# Patient Record
Sex: Male | Born: 1965
Health system: Southern US, Community
[De-identification: ages and names within clinical notes are randomized; demographics above are authoritative.]

## PROBLEM LIST (undated history)

## (undated) DIAGNOSIS — F329 Major depressive disorder, single episode, unspecified: Secondary | ICD-10-CM

## (undated) DIAGNOSIS — E785 Hyperlipidemia, unspecified: Secondary | ICD-10-CM

## (undated) DIAGNOSIS — I10 Essential (primary) hypertension: Secondary | ICD-10-CM

## (undated) DIAGNOSIS — F32A Depression, unspecified: Secondary | ICD-10-CM

## (undated) DIAGNOSIS — B009 Herpesviral infection, unspecified: Secondary | ICD-10-CM

---

## 2012-09-28 ENCOUNTER — Emergency Department (HOSPITAL_BASED_OUTPATIENT_CLINIC_OR_DEPARTMENT_OTHER): Payer: Self-pay

## 2012-09-28 ENCOUNTER — Encounter (HOSPITAL_BASED_OUTPATIENT_CLINIC_OR_DEPARTMENT_OTHER): Payer: Self-pay

## 2012-09-28 ENCOUNTER — Emergency Department (HOSPITAL_BASED_OUTPATIENT_CLINIC_OR_DEPARTMENT_OTHER)
Admission: EM | Admit: 2012-09-28 | Discharge: 2012-09-28 | Disposition: A | Discharge status: Home / Self Care | Payer: Self-pay | Attending: Emergency Medicine | Admitting: Emergency Medicine

## 2012-09-28 DIAGNOSIS — R197 Diarrhea, unspecified: Secondary | ICD-10-CM | POA: Insufficient documentation

## 2012-09-28 DIAGNOSIS — R1110 Vomiting, unspecified: Secondary | ICD-10-CM

## 2012-09-28 DIAGNOSIS — Z880 Allergy status to penicillin: Secondary | ICD-10-CM | POA: Insufficient documentation

## 2012-09-28 DIAGNOSIS — R112 Nausea with vomiting, unspecified: Secondary | ICD-10-CM | POA: Insufficient documentation

## 2012-09-28 LAB — CBC WITH DIFFERENTIAL/PLATELET
Eosinophils Relative: 1 % (ref 0–5)
HCT: 50.8 % (ref 39.0–52.0)
Hemoglobin: 16.8 g/dL (ref 13.0–17.0)
Lymphocytes Relative: 41 % (ref 12–46)
Lymphs Abs: 2.3 10*3/uL (ref 0.7–4.0)
MCV: 85.8 fL (ref 78.0–100.0)
Monocytes Absolute: 0.4 10*3/uL (ref 0.1–1.0)
Monocytes Relative: 7 % (ref 3–12)
Neutro Abs: 3 10*3/uL (ref 1.7–7.7)
WBC: 5.8 10*3/uL (ref 4.0–10.5)

## 2012-09-28 LAB — COMPREHENSIVE METABOLIC PANEL
AST: 28 U/L (ref 0–37)
BUN: 10 mg/dL (ref 6–23)
CO2: 31 mEq/L (ref 19–32)
Chloride: 104 mEq/L (ref 96–112)
Creatinine, Ser: 1 mg/dL (ref 0.50–1.35)
GFR calc Af Amer: >90 mL/min (ref >90)
GFR calc non Af Amer: 87 mL/min — ABNORMAL LOW (ref >90)
Glucose, Bld: 100 mg/dL — ABNORMAL HIGH (ref 70–99)
Total Bilirubin: 0.8 mg/dL (ref 0.3–1.2)

## 2012-09-28 MED ORDER — SODIUM CHLORIDE 0.9 % IV SOLN
Freq: Once | INTRAVENOUS | Status: AC
  Administered 2012-09-28: 17:00:00 via INTRAVENOUS

## 2012-09-28 MED ORDER — ONDANSETRON 4 MG PO TBDP: 4.0000 mg | ORAL_TABLET | Freq: Three times a day (TID) | ORAL | Status: DC | PRN

## 2012-09-28 MED ORDER — ONDANSETRON HCL 4 MG/2ML IJ SOLN
4.0000 mg | Freq: Once | INTRAMUSCULAR | Status: AC
  Administered 2012-09-28: 4 mg via INTRAVENOUS
  Filled 2012-09-28: qty 2

## 2012-09-28 NOTE — ED Provider Notes (Signed)
Medical screening examination/treatment/procedure(s) were performed by non-physician practitioner and as supervising physician I was immediately available for consultation/collaboration.   Dagmar Hait, MD 09/28/12 [phone]

## 2012-09-28 NOTE — ED Provider Notes (Signed)
CSN: [id number]     Arrival date & time 09/28/12  1410 History   First MD Initiated Contact with Patient 09/28/12 1506     Chief Complaint  Patient presents with  . Emesis   (Consider location/radiation/quality/duration/timing/severity/associated sxs/prior Treatment) Patient is a 47 y.o. male presenting with vomiting.  Emesis Severity:  Moderate Duration:  4 days Timing:  Constant Number of daily episodes:  4 Progression:  Worsening Chronicity:  New Recent urination:  Normal Relieved by:  Nothing Worsened by:  Nothing tried Ineffective treatments:  None tried Risk factors: no diabetes   Pt complains of vomiting and diarrhea.   Pt gave blood at the plasma center.    History reviewed. No pertinent past medical history. History reviewed. No pertinent past surgical history. No family history on file. History  Substance Use Topics  . Smoking status: Never Smoker   . Smokeless tobacco: Not on file  . Alcohol Use: No    Review of Systems  Gastrointestinal: Positive for vomiting.  All other systems reviewed and are negative.    Allergies  Penicillins  Home Medications  No current outpatient prescriptions on file. BP 119/78  Pulse 76  Temp(Src) 98.7 F (37.1 C)  Resp 20  Wt 247 lb (112.038 kg)  SpO2 98% Physical Exam  Nursing note and vitals reviewed. Constitutional: He appears well-developed and well-nourished.  HENT:  Head: Normocephalic.  Right Ear: External ear normal.  Left Ear: External ear normal.  Nose: Nose normal.  Mouth/Throat: Oropharynx is clear and moist.  Eyes: Conjunctivae and EOM are normal. Pupils are equal, round, and reactive to light.  Neck: Normal range of motion. Neck supple.  Cardiovascular: Normal rate.   Pulmonary/Chest: Effort normal.  Abdominal: Soft. There is tenderness.  Musculoskeletal: Normal range of motion.  Neurological: He is alert.  Skin: Skin is warm.    ED Course  Procedures (including critical care time) Labs  Review Labs Reviewed  CBC WITH DIFFERENTIAL - Abnormal; Notable for the following:    RBC 5.92 (*)    All other components within normal limits  COMPREHENSIVE METABOLIC PANEL   Imaging Review No results found.  MDM   1. Vomiting and diarrhea   Pt reports feeling better, he is able to tolerate po fluids Pt given Iv fluids,  Labs reviewed.   Pt given rx for zofran.   I advised follow up for recheck in 48 hours if not improved    Elson Areas, [place] 09/28/12 2218

## 2012-09-28 NOTE — ED Notes (Signed)
Patient here with reported vomiting x 4 since donating plasma this am. Reports ongoing nausea and feels weak. Denies pain

## 2012-11-26 ENCOUNTER — Emergency Department (HOSPITAL_BASED_OUTPATIENT_CLINIC_OR_DEPARTMENT_OTHER): Payer: Self-pay

## 2012-11-26 ENCOUNTER — Encounter (HOSPITAL_BASED_OUTPATIENT_CLINIC_OR_DEPARTMENT_OTHER): Payer: Self-pay | Admitting: Emergency Medicine

## 2012-11-26 ENCOUNTER — Emergency Department (HOSPITAL_BASED_OUTPATIENT_CLINIC_OR_DEPARTMENT_OTHER)
Admission: EM | Admit: 2012-11-26 | Discharge: 2012-11-27 | Disposition: A | Discharge status: Home / Self Care | Payer: Self-pay | Attending: Emergency Medicine | Admitting: Emergency Medicine

## 2012-11-26 DIAGNOSIS — J029 Acute pharyngitis, unspecified: Secondary | ICD-10-CM | POA: Insufficient documentation

## 2012-11-26 DIAGNOSIS — Z880 Allergy status to penicillin: Secondary | ICD-10-CM | POA: Insufficient documentation

## 2012-11-26 DIAGNOSIS — J159 Unspecified bacterial pneumonia: Secondary | ICD-10-CM | POA: Insufficient documentation

## 2012-11-26 DIAGNOSIS — J189 Pneumonia, unspecified organism: Secondary | ICD-10-CM

## 2012-11-26 NOTE — ED Notes (Signed)
Pt c/o cough and sore throat x2 days. Pt sts cough is productive of thick, brown sputum. Pt sts he has had pneumonia before and this feels similar.

## 2012-11-27 MED ORDER — AZITHROMYCIN 250 MG PO TABS: ORAL_TABLET | ORAL | Status: DC

## 2012-11-27 MED ORDER — HYDROCOD POLST-CHLORPHEN POLST 10-8 MG/5ML PO LQCR: 5.0000 mL | Freq: Two times a day (BID) | ORAL | Status: DC | PRN

## 2012-11-27 NOTE — ED Provider Notes (Signed)
CSN: [id number]     Arrival date & time 11/26/12  2320 History   First MD Initiated Contact with Patient 11/27/12 0150     Chief Complaint  Patient presents with  . Cough   (Consider location/radiation/quality/duration/timing/severity/associated sxs/prior Treatment) HPI This is a 47 year old male with a two-day history of cough accompanied by sore throat, rhinorrhea, nasal congestion and general malaise. His cough is productive of brown sputum. He characterizes the symptoms is like prior pneumonia. He denies nausea or vomiting. He is not aware of having a fever. He states his ribs hurt with a burning type pain, worse with coughing or sneezing.  History reviewed. No pertinent past medical history. History reviewed. No pertinent past surgical history. No family history on file. History  Substance Use Topics  . Smoking status: Never Smoker   . Smokeless tobacco: Not on file  . Alcohol Use: No    Review of Systems  All other systems reviewed and are negative.    Allergies  Penicillins  Home Medications   Current Outpatient Rx  Name  Route  Sig  Dispense  Refill  . hydrOXYzine (ATARAX/VISTARIL) 25 MG tablet   Oral   Take 25 mg by mouth 3 (three) times daily as needed.         . ondansetron (ZOFRAN ODT) 4 MG disintegrating tablet   Oral   Take 1 tablet (4 mg total) by mouth every 8 (eight) hours as needed for nausea.   20 tablet   0    BP 122/80  Pulse 99  Temp(Src) 98.9 F (37.2 C) (Oral)  Resp 18  Ht 6\' 1"  (1.854 m)  Wt 276 lb (125.193 kg)  BMI 36.42 kg/m2  SpO2 94%  Physical Exam General: Well-developed, well-nourished male in no acute distress; appearance consistent with age of record HENT: normocephalic; atraumatic; mild pharyngeal erythema; nasal congestion Eyes: pupils equal, round and reactive to light; extraocular muscles intact; arcus senilis bilaterally Neck: supple Heart: regular rate and rhythm Lungs: clear to auscultation bilaterally Abdomen:  soft; nondistended Extremities: No deformity; full range of motion Neurologic: Awake, alert and oriented; motor function intact in all extremities and symmetric; no facial droop Skin: Warm and dry Psychiatric: Flat affect    ED Course  Procedures (including critical care time)  MDM  Nursing notes and vitals signs, including pulse oximetry, reviewed.  Summary of this visit's results, reviewed by myself:  Imaging Studies: Dg Chest 2 View  11/27/2012   CLINICAL DATA:  Cough and congestion for 2 days.  EXAM: CHEST  2 VIEW  COMPARISON:  Chest radiograph performed 04/25/2012  FINDINGS: The lungs are well-aerated. Minimal right basilar opacity may reflect atelectasis or less likely mild pneumonia. There is no evidence of pleural effusion or pneumothorax.  The heart is normal in size; the mediastinal contour is within normal limits. No acute osseous abnormalities are seen.  IMPRESSION: Minimal right basilar airspace opacity may reflect atelectasis or less likely mild pneumonia.   Electronically Signed   By: Roanna Raider M.D.   On: 11/27/2012 00:29        Hanley Seamen, MD 11/27/12 [phone]

## 2014-04-16 ENCOUNTER — Encounter (HOSPITAL_BASED_OUTPATIENT_CLINIC_OR_DEPARTMENT_OTHER): Payer: Self-pay

## 2014-04-16 ENCOUNTER — Emergency Department (HOSPITAL_BASED_OUTPATIENT_CLINIC_OR_DEPARTMENT_OTHER)
Admission: EM | Admit: 2014-04-16 | Discharge: 2014-04-16 | Disposition: A | Discharge status: Home / Self Care | Payer: BLUE CROSS/BLUE SHIELD | Attending: Emergency Medicine | Admitting: Emergency Medicine

## 2014-04-16 ENCOUNTER — Emergency Department (HOSPITAL_BASED_OUTPATIENT_CLINIC_OR_DEPARTMENT_OTHER): Payer: BLUE CROSS/BLUE SHIELD

## 2014-04-16 DIAGNOSIS — Z79899 Other long term (current) drug therapy: Secondary | ICD-10-CM | POA: Diagnosis not present

## 2014-04-16 DIAGNOSIS — R05 Cough: Secondary | ICD-10-CM | POA: Diagnosis present

## 2014-04-16 DIAGNOSIS — Z880 Allergy status to penicillin: Secondary | ICD-10-CM | POA: Insufficient documentation

## 2014-04-16 DIAGNOSIS — J209 Acute bronchitis, unspecified: Secondary | ICD-10-CM | POA: Insufficient documentation

## 2014-04-16 MED ORDER — AZITHROMYCIN 250 MG PO TABS: ORAL_TABLET | ORAL | Status: DC

## 2014-04-16 MED ORDER — HYDROCOD POLST-CHLORPHEN POLST 10-8 MG/5ML PO LQCR: 5.0000 mL | Freq: Two times a day (BID) | ORAL | Status: DC | PRN

## 2014-04-16 NOTE — ED Notes (Signed)
Reports cough, cold, chills, vomiting and body aches since Saturday. No relief with OTC meds

## 2014-04-16 NOTE — ED Notes (Signed)
Patient transported to X-ray 

## 2014-04-16 NOTE — ED Notes (Signed)
Pt reports cough x Friday night producing very small amounts of sputum, "not enough to make me feel better".

## 2014-04-16 NOTE — Discharge Instructions (Signed)
Zithromax as prescribed.  Tussionex as prescribed as needed for cough.  Return to the emergency department if your symptoms significantly worsen or change.    Acute Bronchitis Bronchitis is inflammation of the airways that extend from the windpipe into the lungs (bronchi). The inflammation often causes mucus to develop. This leads to a cough, which is the most common symptom of bronchitis.  In acute bronchitis, the condition usually develops suddenly and goes away over time, usually in a couple weeks. Smoking, allergies, and asthma can make bronchitis worse. Repeated episodes of bronchitis may cause further lung problems.  CAUSES Acute bronchitis is most often caused by the same virus that causes a cold. The virus can spread from person to person (contagious) through coughing, sneezing, and touching contaminated objects. SIGNS AND SYMPTOMS   Cough.   Fever.   Coughing up mucus.   Body aches.   Chest congestion.   Chills.   Shortness of breath.   Sore throat.  DIAGNOSIS  Acute bronchitis is usually diagnosed through a physical exam. Your health care provider will also ask you questions about your medical history. Tests, such as chest X-rays, are sometimes done to rule out other conditions.  TREATMENT  Acute bronchitis usually goes away in a couple weeks. Oftentimes, no medical treatment is necessary. Medicines are sometimes given for relief of fever or cough. Antibiotic medicines are usually not needed but may be prescribed in certain situations. In some cases, an inhaler may be recommended to help reduce shortness of breath and control the cough. A cool mist vaporizer may also be used to help thin bronchial secretions and make it easier to clear the chest.  HOME CARE INSTRUCTIONS  Get plenty of rest.   Drink enough fluids to keep your urine clear or pale yellow (unless you have a medical condition that requires fluid restriction). Increasing fluids may help thin your  respiratory secretions (sputum) and reduce chest congestion, and it will prevent dehydration.   Take medicines only as directed by your health care provider.  If you were prescribed an antibiotic medicine, finish it all even if you start to feel better.  Avoid smoking and secondhand smoke. Exposure to cigarette smoke or irritating chemicals will make bronchitis worse. If you are a smoker, consider using nicotine gum or skin patches to help control withdrawal symptoms. Quitting smoking will help your lungs heal faster.   Reduce the chances of another bout of acute bronchitis by washing your hands frequently, avoiding people with cold symptoms, and trying not to touch your hands to your mouth, nose, or eyes.   Keep all follow-up visits as directed by your health care provider.  SEEK MEDICAL CARE IF: Your symptoms do not improve after 1 week of treatment.  SEEK IMMEDIATE MEDICAL CARE IF:  You develop an increased fever or chills.   You have chest pain.   You have severe shortness of breath.  You have bloody sputum.   You develop dehydration.  You faint or repeatedly feel like you are going to pass out.  You develop repeated vomiting.  You develop a severe headache. MAKE SURE YOU:   Understand these instructions.  Will watch your condition.  Will get help right away if you are not doing well or get worse. Document Released: 02/10/2004 Document Revised: 05/19/2013 Document Reviewed: 06/25/2012 [facility] Patient Information 2015 [place], [place]. This information is not intended to replace advice given to you by your health care provider. Make sure you discuss any questions you have with your  health care provider. ° °

## 2014-04-16 NOTE — ED Provider Notes (Signed)
CSN: [id number]     Arrival date & time 04/16/14  1628 History   First MD Initiated Contact with Patient 04/16/14 1642     Chief Complaint  Patient presents with  . Influenza     (Consider location/radiation/quality/duration/timing/severity/associated sxs/prior Treatment) HPI Comments: Patient is a 49 year old male who presents with complaints of chest congestion and cough for one week. He denies any fevers. He denies a shortness of breath or chest pain. He does feel chilled and reports body aches. He is having little relief with over-the-counter medications.  Patient is a 49 y.o. male presenting with flu symptoms. The history is provided by the patient.  Influenza Presenting symptoms: cough   Presenting symptoms: no nausea, no sore throat and no vomiting   Severity:  Moderate Onset quality:  Sudden Duration:  1 week Progression:  Worsening Chronicity:  New Relieved by:  Nothing Worsened by:  Nothing tried Ineffective treatments:  None tried Associated symptoms: no chills and no congestion   Risk factors: no diabetes problem     History reviewed. No pertinent past medical history. History reviewed. No pertinent past surgical history. No family history on file. History  Substance Use Topics  . Smoking status: Never Smoker   . Smokeless tobacco: Not on file  . Alcohol Use: No    Review of Systems  Constitutional: Negative for chills.  HENT: Negative for congestion and sore throat.   Respiratory: Positive for cough.   Gastrointestinal: Negative for nausea and vomiting.  All other systems reviewed and are negative.     Allergies  Penicillins  Home Medications   Prior to Admission medications   Medication Sig Start Date End Date Taking? Authorizing Provider  azithromycin (ZITHROMAX Z-PAK) 250 MG tablet 2 po day one, then 1 daily x 4 days 11/27/12   Paula Libra, MD  chlorpheniramine-HYDROcodone [facility]) 10-8 MG/5ML LQCR Take 5 mLs by mouth every 12  (twelve) hours as needed for cough. 11/27/12   John Molpus, MD  hydrOXYzine (ATARAX/VISTARIL) 25 MG tablet Take 25 mg by mouth 3 (three) times daily as needed.    Historical Provider, MD  ondansetron (ZOFRAN ODT) 4 MG disintegrating tablet Take 1 tablet (4 mg total) by mouth every 8 (eight) hours as needed for nausea. 09/28/12   Elson Areas, PA-C   BP 129/78 mmHg  Pulse 94  Temp(Src) 98.9 F (37.2 C) (Oral)  Resp 16  Ht  (1.854 m)  Wt 277 lb (125.646 kg)  BMI 36.55 kg/m2  SpO2 97% Physical Exam  Constitutional: He is oriented to person, place, and time. He appears well-developed and well-nourished. No distress.  HENT:  Head: Normocephalic and atraumatic.  Mouth/Throat: Oropharynx is clear and moist. No oropharyngeal exudate.  Neck: Normal range of motion. Neck supple.  Cardiovascular: Normal rate, regular rhythm and normal heart sounds.   No murmur heard. Pulmonary/Chest: Effort normal and breath sounds normal. No respiratory distress. He has no wheezes. He has no rales. He exhibits no tenderness.  Abdominal: Soft. Bowel sounds are normal. He exhibits no distension. There is no tenderness.  Musculoskeletal: Normal range of motion. He exhibits no edema.  Lymphadenopathy:    He has no cervical adenopathy.  Neurological: He is alert and oriented to person, place, and time.  Skin: Skin is warm and dry. He is not diaphoretic.  Nursing note and vitals reviewed.   ED Course  Procedures (including critical care time) Labs Review Labs Reviewed - No data to display  Imaging Review No  results found.   EKG Interpretation None      MDM   Final diagnoses:  None    X-ray reveals no evidence for acute cardiopulmonary abnormality. He tells me he has a history of pneumonia and believes he feels the same. He will be given a prescription for Zithromax and cough medication and advised to return if his symptoms worsen or change.    Geoffery Lyonsouglas Wenceslao Loper, MD 04/16/14 [phone]

## 2014-06-10 ENCOUNTER — Encounter (HOSPITAL_BASED_OUTPATIENT_CLINIC_OR_DEPARTMENT_OTHER): Payer: Self-pay | Admitting: Emergency Medicine

## 2014-06-10 ENCOUNTER — Emergency Department (HOSPITAL_BASED_OUTPATIENT_CLINIC_OR_DEPARTMENT_OTHER): Payer: BLUE CROSS/BLUE SHIELD

## 2014-06-10 ENCOUNTER — Emergency Department (HOSPITAL_BASED_OUTPATIENT_CLINIC_OR_DEPARTMENT_OTHER)
Admission: EM | Admit: 2014-06-10 | Discharge: 2014-06-10 | Disposition: A | Discharge status: Home / Self Care | Payer: BLUE CROSS/BLUE SHIELD | Attending: Emergency Medicine | Admitting: Emergency Medicine

## 2014-06-10 DIAGNOSIS — J209 Acute bronchitis, unspecified: Secondary | ICD-10-CM | POA: Diagnosis not present

## 2014-06-10 DIAGNOSIS — F329 Major depressive disorder, single episode, unspecified: Secondary | ICD-10-CM | POA: Insufficient documentation

## 2014-06-10 DIAGNOSIS — Z880 Allergy status to penicillin: Secondary | ICD-10-CM | POA: Insufficient documentation

## 2014-06-10 DIAGNOSIS — Z79899 Other long term (current) drug therapy: Secondary | ICD-10-CM | POA: Insufficient documentation

## 2014-06-10 DIAGNOSIS — J40 Bronchitis, not specified as acute or chronic: Secondary | ICD-10-CM

## 2014-06-10 DIAGNOSIS — M791 Myalgia: Secondary | ICD-10-CM | POA: Insufficient documentation

## 2014-06-10 DIAGNOSIS — R05 Cough: Secondary | ICD-10-CM | POA: Diagnosis present

## 2014-06-10 DIAGNOSIS — Z792 Long term (current) use of antibiotics: Secondary | ICD-10-CM | POA: Insufficient documentation

## 2014-06-10 HISTORY — DX: Depression, unspecified: F32.A

## 2014-06-10 HISTORY — DX: Major depressive disorder, single episode, unspecified: F32.9

## 2014-06-10 LAB — RAPID STREP SCREEN (MED CTR MEBANE ONLY): STREPTOCOCCUS, GROUP A SCREEN (DIRECT): NEGATIVE

## 2014-06-10 MED ORDER — BENZONATATE 100 MG PO CAPS
200.0000 mg | ORAL_CAPSULE | Freq: Once | ORAL | Status: AC
  Administered 2014-06-10: 200 mg via ORAL
  Filled 2014-06-10: qty 2

## 2014-06-10 MED ORDER — GUAIFENESIN-CODEINE 100-10 MG/5ML PO SYRP: 5.0000 mL | ORAL_SOLUTION | Freq: Three times a day (TID) | ORAL | Status: DC | PRN

## 2014-06-10 MED ORDER — NAPROXEN 250 MG PO TABS
500.0000 mg | ORAL_TABLET | Freq: Once | ORAL | Status: AC
  Administered 2014-06-10: 500 mg via ORAL
  Filled 2014-06-10: qty 2

## 2014-06-10 MED ORDER — GI COCKTAIL ~~LOC~~
30.0000 mL | Freq: Once | ORAL | Status: AC
  Administered 2014-06-10: 30 mL via ORAL
  Filled 2014-06-10: qty 30

## 2014-06-10 MED ORDER — NAPROXEN 375 MG PO TABS: 375.0000 mg | ORAL_TABLET | Freq: Two times a day (BID) | ORAL | Status: DC

## 2014-06-10 MED ORDER — DOXYCYCLINE HYCLATE 100 MG PO CAPS: 100.0000 mg | ORAL_CAPSULE | Freq: Two times a day (BID) | ORAL | Status: DC

## 2014-06-10 NOTE — ED Notes (Signed)
C/o cough, productive,   Congestion and sorethroat since Saturday pm

## 2014-06-10 NOTE — ED Provider Notes (Signed)
CSN: [id number]     Arrival date & time 06/10/14  0030 History   First MD Initiated Contact with Patient 06/10/14 0111     Chief Complaint  Patient presents with  . Cough     (Consider location/radiation/quality/duration/timing/severity/associated sxs/prior Treatment) Patient is a 49 y.o. male presenting with cough. The history is provided by the patient.  Cough Cough characteristics:  Productive Sputum characteristics:  Manson PasseyBrown Severity:  Moderate Onset quality:  Gradual Duration:  5 days Timing:  Intermittent Progression:  Unchanged Chronicity:  Recurrent Context: not animal exposure   Relieved by:  Nothing Worsened by:  Nothing tried Ineffective treatments:  Rest Associated symptoms: myalgias, rhinorrhea, sinus congestion and sore throat   Associated symptoms: no chest pain, no fever, no rash, no shortness of breath and no wheezing   Risk factors: no recent travel   patient states he has had bronchitis in the past and this feels the same.    Past Medical History  Diagnosis Date  . Depression    History reviewed. No pertinent past surgical history. History reviewed. No pertinent family history. History  Substance Use Topics  . Smoking status: Never Smoker   . Smokeless tobacco: Not on file  . Alcohol Use: No    Review of Systems  Constitutional: Negative for fever.  HENT: Positive for rhinorrhea and sore throat.   Eyes: Negative for photophobia.  Respiratory: Positive for cough. Negative for shortness of breath and wheezing.   Cardiovascular: Negative for chest pain.  Gastrointestinal: Negative for vomiting and diarrhea.  Genitourinary: Negative for dysuria.  Musculoskeletal: Positive for myalgias. Negative for neck pain and neck stiffness.  Skin: Negative for rash.  All other systems reviewed and are negative.     Allergies  Penicillins  Home Medications   Prior to Admission medications   Medication Sig Start Date End Date Taking? Authorizing Provider   sertraline (ZOLOFT) 50 MG tablet Take 50 mg by mouth daily.   Yes Historical Provider, MD  azithromycin (ZITHROMAX Z-PAK) 250 MG tablet 2 po day one, then 1 daily x 4 days 04/16/14   Geoffery Lyonsouglas Delo, MD  chlorpheniramine-HYDROcodone [facility](TUSSIONEX PENNKINETIC ER) 10-8 MG/5ML LQCR Take 5 mLs by mouth every 12 (twelve) hours as needed for cough. 04/16/14   Geoffery Lyonsouglas Delo, MD  doxycycline (VIBRAMYCIN) 100 MG capsule Take 1 capsule (100 mg total) by mouth 2 (two) times daily. One po bid x 7 days 06/10/14   Maricel Swartzendruber, MD  guaiFENesin-codeine [facility](GUAIATUSSIN AC) 100-10 MG/5ML syrup Take 5 mLs by mouth 3 (three) times daily as needed for cough. 06/10/14   Sejla Marzano, MD  hydrOXYzine (ATARAX/VISTARIL) 25 MG tablet Take 25 mg by mouth 3 (three) times daily as needed.    Historical Provider, MD  naproxen (NAPROSYN) 375 MG tablet Take 1 tablet (375 mg total) by mouth 2 (two) times daily. 06/10/14   Samah Lapiana, MD  ondansetron (ZOFRAN ODT) 4 MG disintegrating tablet Take 1 tablet (4 mg total) by mouth every 8 (eight) hours as needed for nausea. 09/28/12   Elson AreasLeslie K Sofia, PA-C   BP 152/83 mmHg  Pulse 114  Temp(Src) 99.7 F (37.6 C) (Oral)  Resp 22  Ht 6\' 1"  (1.854 m)  Wt 237 lb (107.502 kg)  BMI 31.27 kg/m2  SpO2 95% Physical Exam  Constitutional: He is oriented to person, place, and time. He appears well-developed and well-nourished. No distress.  HENT:  Head: Normocephalic and atraumatic.  Right Ear: External ear normal.  Left Ear: External ear normal.  Mouth/Throat:  No oropharyngeal exudate.  Eyes: Conjunctivae and EOM are normal. Pupils are equal, round, and reactive to light.  Neck: Normal range of motion. Neck supple.  Cardiovascular: Normal rate, regular rhythm and intact distal pulses.   Pulmonary/Chest: Effort normal and breath sounds normal. No stridor. No respiratory distress. He has no wheezes. He has no rales. He exhibits no tenderness.  Abdominal: Soft. Bowel sounds are normal. There is no  tenderness. There is no rebound and no guarding.  Musculoskeletal: Normal range of motion.  Lymphadenopathy:    He has no cervical adenopathy.  Neurological: He is alert and oriented to person, place, and time.  Skin: Skin is warm and dry.  Psychiatric: He has a normal mood and affect.    ED Course  Procedures (including critical care time) Labs Review Labs Reviewed  RAPID STREP SCREEN  CULTURE, GROUP A STREP    Imaging Review Dg Chest 2 View (if Patient Has Fever And/or Copd)  06/10/2014   CLINICAL DATA:  Cough and body aches beginning 4 days ago.  EXAM: CHEST  2 VIEW  COMPARISON:  04/16/2014  FINDINGS: Normal heart size and mediastinal contours. No acute infiltrate or edema. No effusion or pneumothorax. No acute osseous findings.  IMPRESSION: No active cardiopulmonary disease.   Electronically Signed   By: Marnee Spring M.D.   On: 06/10/2014 01:28     EKG Interpretation None      MDM   Final diagnoses:  Bronchitis    Will treat symptomatically.  Follow up with your family doctor in 2 days for recheck.  Return for intractable fevers, weakness, inability to tolerate liquids or any concerns    Tyreon Frigon, MD 06/10/14 [phone]

## 2014-06-10 NOTE — ED Notes (Signed)
Patient states that he is coughing and has pheglm with a brown tinge to it. The patient reports that he has gotten progressively sore "like all my bones are going to shatter"

## 2014-06-12 LAB — CULTURE, GROUP A STREP: Strep A Culture: NEGATIVE

## 2014-08-03 ENCOUNTER — Encounter (HOSPITAL_COMMUNITY): Payer: Self-pay | Admitting: *Deleted

## 2014-08-03 ENCOUNTER — Emergency Department (HOSPITAL_COMMUNITY)
Admission: EM | Admit: 2014-08-03 | Discharge: 2014-08-03 | Disposition: A | Discharge status: Home / Self Care | Payer: BLUE CROSS/BLUE SHIELD | Attending: Emergency Medicine | Admitting: Emergency Medicine

## 2014-08-03 ENCOUNTER — Emergency Department (HOSPITAL_COMMUNITY): Payer: BLUE CROSS/BLUE SHIELD

## 2014-08-03 DIAGNOSIS — F329 Major depressive disorder, single episode, unspecified: Secondary | ICD-10-CM | POA: Insufficient documentation

## 2014-08-03 DIAGNOSIS — Z79899 Other long term (current) drug therapy: Secondary | ICD-10-CM | POA: Insufficient documentation

## 2014-08-03 DIAGNOSIS — Z791 Long term (current) use of non-steroidal anti-inflammatories (NSAID): Secondary | ICD-10-CM | POA: Diagnosis not present

## 2014-08-03 DIAGNOSIS — S199XXA Unspecified injury of neck, initial encounter: Secondary | ICD-10-CM | POA: Insufficient documentation

## 2014-08-03 DIAGNOSIS — Z880 Allergy status to penicillin: Secondary | ICD-10-CM | POA: Diagnosis not present

## 2014-08-03 DIAGNOSIS — Y92410 Unspecified street and highway as the place of occurrence of the external cause: Secondary | ICD-10-CM | POA: Diagnosis not present

## 2014-08-03 DIAGNOSIS — Y999 Unspecified external cause status: Secondary | ICD-10-CM | POA: Insufficient documentation

## 2014-08-03 DIAGNOSIS — Y9389 Activity, other specified: Secondary | ICD-10-CM | POA: Diagnosis not present

## 2014-08-03 DIAGNOSIS — M542 Cervicalgia: Secondary | ICD-10-CM

## 2014-08-03 MED ORDER — TRAMADOL HCL 50 MG PO TABS: 50.0000 mg | ORAL_TABLET | Freq: Four times a day (QID) | ORAL | Status: DC | PRN

## 2014-08-03 MED ORDER — CYCLOBENZAPRINE HCL 10 MG PO TABS: 5.0000 mg | ORAL_TABLET | Freq: Two times a day (BID) | ORAL | Status: DC | PRN

## 2014-08-03 MED ORDER — NAPROXEN 500 MG PO TABS: 500.0000 mg | ORAL_TABLET | Freq: Two times a day (BID) | ORAL | Status: DC
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
## 2014-08-10 NOTE — ED Notes (Signed)
Pt's chart was accessed to reprint work note.

## 2015-01-09 ENCOUNTER — Emergency Department (HOSPITAL_BASED_OUTPATIENT_CLINIC_OR_DEPARTMENT_OTHER): Payer: BLUE CROSS/BLUE SHIELD

## 2015-01-09 ENCOUNTER — Emergency Department (HOSPITAL_BASED_OUTPATIENT_CLINIC_OR_DEPARTMENT_OTHER)
Admission: EM | Admit: 2015-01-09 | Discharge: 2015-01-09 | Disposition: A | Discharge status: Home / Self Care | Payer: BLUE CROSS/BLUE SHIELD | Attending: Emergency Medicine | Admitting: Emergency Medicine

## 2015-01-09 ENCOUNTER — Encounter (HOSPITAL_BASED_OUTPATIENT_CLINIC_OR_DEPARTMENT_OTHER): Payer: Self-pay

## 2015-01-09 DIAGNOSIS — Z880 Allergy status to penicillin: Secondary | ICD-10-CM | POA: Insufficient documentation

## 2015-01-09 DIAGNOSIS — K529 Noninfective gastroenteritis and colitis, unspecified: Secondary | ICD-10-CM | POA: Diagnosis not present

## 2015-01-09 DIAGNOSIS — F329 Major depressive disorder, single episode, unspecified: Secondary | ICD-10-CM | POA: Diagnosis not present

## 2015-01-09 DIAGNOSIS — Z79899 Other long term (current) drug therapy: Secondary | ICD-10-CM | POA: Diagnosis not present

## 2015-01-09 DIAGNOSIS — R109 Unspecified abdominal pain: Secondary | ICD-10-CM | POA: Diagnosis present

## 2015-01-09 DIAGNOSIS — I10 Essential (primary) hypertension: Secondary | ICD-10-CM | POA: Insufficient documentation

## 2015-01-09 HISTORY — DX: Essential (primary) hypertension: I10

## 2015-01-09 LAB — CBC WITH DIFFERENTIAL/PLATELET
BASOS PCT: 0 %
Basophils Absolute: 0 10*3/uL (ref 0.0–0.1)
Eosinophils Absolute: 0 10*3/uL (ref 0.0–0.7)
Eosinophils Relative: 0 %
HEMATOCRIT: 51.8 % (ref 39.0–52.0)
HEMOGLOBIN: 16.8 g/dL (ref 13.0–17.0)
LYMPHS ABS: 1.2 10*3/uL (ref 0.7–4.0)
Lymphocytes Relative: 12 %
MCH: 27.6 pg (ref 26.0–34.0)
MCHC: 32.4 g/dL (ref 30.0–36.0)
MCV: 85.1 fL (ref 78.0–100.0)
MONOS PCT: 4 %
Monocytes Absolute: 0.4 10*3/uL (ref 0.1–1.0)
NEUTROS ABS: 8.2 10*3/uL — AB (ref 1.7–7.7)
NEUTROS PCT: 84 %
Platelets: 259 10*3/uL (ref 150–400)
RBC: 6.09 MIL/uL — ABNORMAL HIGH (ref 4.22–5.81)
RDW: 14.3 % (ref 11.5–15.5)
WBC: 9.8 10*3/uL (ref 4.0–10.5)

## 2015-01-09 LAB — COMPREHENSIVE METABOLIC PANEL
ALBUMIN: 4.8 g/dL (ref 3.5–5.0)
ALK PHOS: 90 U/L (ref 38–126)
ALT: 46 U/L (ref 17–63)
ANION GAP: 9 (ref 5–15)
AST: 46 U/L — ABNORMAL HIGH (ref 15–41)
BILIRUBIN TOTAL: 1.5 mg/dL — AB (ref 0.3–1.2)
BUN: 16 mg/dL (ref 6–20)
CALCIUM: 9.5 mg/dL (ref 8.9–10.3)
CO2: 24 mmol/L (ref 22–32)
CREATININE: 0.96 mg/dL (ref 0.61–1.24)
Chloride: 107 mmol/L (ref 101–111)
GFR calc Af Amer: >60 mL/min (ref >60)
GFR calc non Af Amer: >60 mL/min (ref >60)
GLUCOSE: 131 mg/dL — AB (ref 65–99)
Potassium: 3.5 mmol/L (ref 3.5–5.1)
Sodium: 140 mmol/L (ref 135–145)
TOTAL PROTEIN: 8.4 g/dL — AB (ref 6.5–8.1)

## 2015-01-09 LAB — LIPASE, BLOOD: Lipase: 27 U/L (ref 11–51)

## 2015-01-09 MED ORDER — HYDROCODONE-ACETAMINOPHEN 5-325 MG PO TABS: 1.0000 | ORAL_TABLET | Freq: Four times a day (QID) | ORAL | Status: DC | PRN

## 2015-01-09 MED ORDER — ONDANSETRON HCL 4 MG/2ML IJ SOLN
4.0000 mg | Freq: Once | INTRAMUSCULAR | Status: AC
  Administered 2015-01-09: 4 mg via INTRAVENOUS
  Filled 2015-01-09: qty 2

## 2015-01-09 MED ORDER — FENTANYL CITRATE (PF) 100 MCG/2ML IJ SOLN
50.0000 ug | Freq: Once | INTRAMUSCULAR | Status: AC
  Administered 2015-01-09: 50 ug via INTRAVENOUS
  Filled 2015-01-09: qty 2

## 2015-01-09 MED ORDER — IOHEXOL 300 MG/ML  SOLN
100.0000 mL | Freq: Once | INTRAMUSCULAR | Status: AC | PRN
  Administered 2015-01-09: 100 mL via INTRAVENOUS

## 2015-01-09 MED ORDER — LOPERAMIDE HCL 2 MG PO TABS: 2.0000 mg | ORAL_TABLET | Freq: Four times a day (QID) | ORAL | Status: DC | PRN

## 2015-01-09 MED ORDER — SODIUM CHLORIDE 0.9 % IV BOLUS (SEPSIS)
1000.0000 mL | Freq: Once | INTRAVENOUS | Status: AC
  Administered 2015-01-09: 1000 mL via INTRAVENOUS

## 2015-01-09 MED ORDER — IOHEXOL 300 MG/ML  SOLN
25.0000 mL | Freq: Once | INTRAMUSCULAR | Status: AC | PRN
  Administered 2015-01-09: 25 mL via ORAL

## 2015-01-09 MED ORDER — SODIUM CHLORIDE 0.9 % IV SOLN: INTRAVENOUS | Status: DC

## 2015-01-09 MED ORDER — PROMETHAZINE HCL 25 MG PO TABS: 25.0000 mg | ORAL_TABLET | Freq: Four times a day (QID) | ORAL | Status: DC | PRN

## 2015-01-09 NOTE — ED Notes (Signed)
Patient presents to the ED with abdominal pain that began at 0300 today. Patient states the pain is gradually getting worse. Patient has vomited twice, three episodes of diarrhea and one episode of loss of consciousness. The patient hasn't taken any medications.

## 2015-01-09 NOTE — ED Provider Notes (Signed)
CSN: [id number]     Arrival date & time 01/09/15  0900 History   First MD Initiated Contact with Patient 01/09/15 0912     Chief Complaint  Patient presents with  . Abdominal Pain     (Consider location/radiation/quality/duration/timing/severity/associated sxs/prior Treatment) Patient is a 49 y.o. male presenting with abdominal pain. The history is provided by the patient.  Abdominal Pain Associated symptoms: diarrhea, nausea and vomiting   Associated symptoms: no chest pain, no fever and no shortness of breath    patient feeling well until 3 in the morning acute onset of nausea vomiting and diarrhea and crampy abdominal pain that was generalized. No blood in the bowel movements. Patient without any history of abdominal surgery. Patient states crampy abdominal pain is 8 out of 10.  Past Medical History  Diagnosis Date  . Depression   . Hypertension    History reviewed. No pertinent past surgical history. History reviewed. No pertinent family history. Social History  Substance Use Topics  . Smoking status: Never Smoker   . Smokeless tobacco: None  . Alcohol Use: No    Review of Systems  Constitutional: Negative for fever.  HENT: Negative for congestion.   Eyes: Negative for redness.  Respiratory: Negative for shortness of breath.   Cardiovascular: Negative for chest pain.  Gastrointestinal: Positive for nausea, vomiting, abdominal pain and diarrhea.  Musculoskeletal: Negative for myalgias.  Skin: Negative for rash.  Neurological: Negative for headaches.  Hematological: Does not bruise/bleed easily.  Psychiatric/Behavioral: Negative for confusion.      Allergies  Penicillins  Home Medications   Prior to Admission medications   Medication Sig Start Date End Date Taking? Authorizing Provider  azithromycin (ZITHROMAX Z-PAK) 250 MG tablet 2 po day one, then 1 daily x 4 days 04/16/14   Geoffery Lyons, MD  chlorpheniramine-HYDROcodone [facility]) 10-8 MG/5ML  LQCR Take 5 mLs by mouth every 12 (twelve) hours as needed for cough. 04/16/14   Geoffery Lyons, MD  cyclobenzaprine (FLEXERIL) 10 MG tablet Take 0.5-1 tablets (5-10 mg total) by mouth 2 (two) times daily as needed for muscle spasms. 08/03/14   Tiffany Neva Seat, PA-C  doxycycline (VIBRAMYCIN) 100 MG capsule Take 1 capsule (100 mg total) by mouth 2 (two) times daily. One po bid x 7 days 06/10/14   April Palumbo, MD  guaiFENesin-codeine [facility]) 100-10 MG/5ML syrup Take 5 mLs by mouth 3 (three) times daily as needed for cough. 06/10/14   April Palumbo, MD  HYDROcodone-acetaminophen (NORCO/VICODIN) 5-325 MG tablet Take 1-2 tablets by mouth every 6 (six) hours as needed for moderate pain. 01/09/15   Vanetta Mulders, MD  hydrOXYzine (ATARAX/VISTARIL) 25 MG tablet Take 25 mg by mouth 3 (three) times daily as needed.    Historical Provider, MD  loperamide (IMODIUM A-D) 2 MG tablet Take 1 tablet (2 mg total) by mouth 4 (four) times daily as needed for diarrhea or loose stools. 01/09/15   Vanetta Mulders, MD  naproxen (NAPROSYN) 375 MG tablet Take 1 tablet (375 mg total) by mouth 2 (two) times daily. 06/10/14   April Palumbo, MD  naproxen (NAPROSYN) 500 MG tablet Take 1 tablet (500 mg total) by mouth 2 (two) times daily. 08/03/14   Tiffany Neva Seat, PA-C  ondansetron (ZOFRAN ODT) 4 MG disintegrating tablet Take 1 tablet (4 mg total) by mouth every 8 (eight) hours as needed for nausea. 09/28/12   Elson Areas, PA-C  promethazine (PHENERGAN) 25 MG tablet Take 1 tablet (25 mg total) by mouth every 6 (six)  hours as needed for nausea or vomiting. 01/09/15   Vanetta Mulders, MD  sertraline (ZOLOFT) 50 MG tablet Take 50 mg by mouth daily.    Historical Provider, MD  traMADol (ULTRAM) 50 MG tablet Take 1 tablet (50 mg total) by mouth every 6 (six) hours as needed. 08/03/14   Tiffany Neva Seat, PA-C   BP 143/93 mmHg  Pulse 96  Temp(Src) 97.7 F (36.5 C) (Oral)  Resp 24  Ht  (1.854 m)  Wt 104.327 kg  BMI 30.35 kg/m2   SpO2 99% Physical Exam  Constitutional: He is oriented to person, place, and time. He appears well-developed and well-nourished. No distress.  HENT:  Head: Normocephalic and atraumatic.  Mouth/Throat: Oropharynx is clear and moist.  Eyes: Conjunctivae and EOM are normal. Pupils are equal, round, and reactive to light.  Neck: Normal range of motion. Neck supple.  Cardiovascular: Normal rate, regular rhythm and normal heart sounds.   Pulmonary/Chest: Effort normal and breath sounds normal. No respiratory distress.  Abdominal: Soft. Bowel sounds are normal. There is no tenderness.  Musculoskeletal: Normal range of motion. He exhibits no edema.  Neurological: He is alert and oriented to person, place, and time. No cranial nerve deficit. He exhibits normal muscle tone. Coordination normal.  Skin: Skin is warm. No rash noted.  Nursing note and vitals reviewed.   ED Course  Procedures (including critical care time) Labs Review Labs Reviewed  CBC WITH DIFFERENTIAL/PLATELET - Abnormal; Notable for the following:    RBC 6.09 (*)    Neutro Abs 8.2 (*)    All other components within normal limits  COMPREHENSIVE METABOLIC PANEL - Abnormal; Notable for the following:    Glucose, Bld 131 (*)    Total Protein 8.4 (*)    AST 46 (*)    Total Bilirubin 1.5 (*)    All other components within normal limits  LIPASE, BLOOD   Results for orders placed or performed during the hospital encounter of 01/09/15  CBC WITH DIFFERENTIAL  Result Value Ref Range   WBC 9.8 4.0 - 10.5 K/uL   RBC 6.09 (H) 4.22 - 5.81 MIL/uL   Hemoglobin 16.8 13.0 - 17.0 g/dL   HCT 11.9 14.7 - 82.9 %   MCV 85.1 78.0 - 100.0 fL   MCH 27.6 26.0 - 34.0 pg   MCHC 32.4 30.0 - 36.0 g/dL   RDW 56.2 13.0 - 86.5 %   Platelets 259 150 - 400 K/uL   Neutrophils Relative % 84 %   Neutro Abs 8.2 (H) 1.7 - 7.7 K/uL   Lymphocytes Relative 12 %   Lymphs Abs 1.2 0.7 - 4.0 K/uL   Monocytes Relative 4 %   Monocytes Absolute 0.4 0.1 - 1.0  K/uL   Eosinophils Relative 0 %   Eosinophils Absolute 0.0 0.0 - 0.7 K/uL   Basophils Relative 0 %   Basophils Absolute 0.0 0.0 - 0.1 K/uL  Comprehensive metabolic panel  Result Value Ref Range   Sodium 140 135 - 145 mmol/L   Potassium 3.5 3.5 - 5.1 mmol/L   Chloride 107 101 - 111 mmol/L   CO2 24 22 - 32 mmol/L   Glucose, Bld 131 (H) 65 - 99 mg/dL   BUN 16 6 - 20 mg/dL   Creatinine, Ser 7.84 0.61 - 1.24 mg/dL   Calcium 9.5 8.9 - 69.6 mg/dL   Total Protein 8.4 (H) 6.5 - 8.1 g/dL   Albumin 4.8 3.5 - 5.0 g/dL   AST 46 (H) 15 - 41  U/L   ALT 46 17 - 63 U/L   Alkaline Phosphatase 90 38 - 126 U/L   Total Bilirubin 1.5 (H) 0.3 - 1.2 mg/dL   GFR calc non Af Amer >60 >60 mL/min   GFR calc Af Amer >60 >60 mL/min   Anion gap 9 5 - 15  Lipase, blood  Result Value Ref Range   Lipase 27 11 - 51 U/L    Imaging Review Ct Abdomen Pelvis W Contrast  01/09/2015  CLINICAL DATA:  Acute abdomen pain starting this morning. EXAM: CT ABDOMEN AND PELVIS WITH CONTRAST TECHNIQUE: Multidetector CT imaging of the abdomen and pelvis was performed using the standard protocol following bolus administration of intravenous contrast. CONTRAST:  100mL OMNIPAQUE IOHEXOL 300 MG/ML SOLN, 25mL OMNIPAQUE IOHEXOL 300 MG/ML SOLN COMPARISON:  April 18, 2013 FINDINGS: The liver, spleen, pancreas, gallbladder, adrenal glands and kidneys are normal. There is a 8 mm cyst in the lower pole left kidney. There is atherosclerosis of the abdominal aorta without aneurysmal dilatation. There is no abdominal lymphadenopathy. There is umbilical herniation of mesenteric fat. There is no small bowel obstruction or diverticulitis. The appendix is normal. Fluid-filled bladder is normal. There is no pelvic lymphadenopathy bilateral inguinal herniation of mesenteric fat are noted. There is minimal dependent atelectasis of the posterior lung bases. Mild degenerative joint changes of the spine are noted. IMPRESSION: No acute abnormality identified in  the abdomen and pelvis. Electronically Signed   By: Sherian ReinWei-Chen  Lin M.D.   On: 01/09/2015 11:02   I have personally reviewed and evaluated these images and lab results as part of my medical decision-making.   EKG Interpretation None      MDM   Final diagnoses:  Gastroenteritis    CT scan without any acute findings. Labs without any significant abnormality. Symptoms most likely related to a viral gastroenteritis. Patient nontoxic no acute distress.    Vanetta MuldersScott Quadry Kampa, MD 01/09/15 [id number]

## 2015-01-09 NOTE — ED Notes (Signed)
MD at bedside. 

## 2015-01-09 NOTE — Discharge Instructions (Signed)
Food Choices to Help Relieve Diarrhea, Adult °When you have diarrhea, the foods you eat and your eating habits are very important. Choosing the right foods and drinks can help relieve diarrhea. Also, because diarrhea can last up to 7 days, you need to replace lost fluids and electrolytes (such as sodium, potassium, and chloride) in order to help prevent dehydration.  °WHAT GENERAL GUIDELINES DO I NEED TO FOLLOW? °· Slowly drink 1 cup (8 oz) of fluid for each episode of diarrhea. If you are getting enough fluid, your urine will be clear or pale yellow. °· Eat starchy foods. Some good choices include white rice, white toast, pasta, low-fiber cereal, baked potatoes (without the skin), saltine crackers, and bagels. °· Avoid large servings of any cooked vegetables. °· Limit fruit to two servings per day. A serving is ½ cup or 1 small piece. °· Choose foods with less than 2 g of fiber per serving. °· Limit fats to less than 8 tsp (38 g) per day. °· Avoid fried foods. °· Eat foods that have probiotics in them. Probiotics can be found in certain dairy products. °· Avoid foods and beverages that may increase the speed at which food moves through the stomach and intestines (gastrointestinal tract). Things to avoid include: °¨ High-fiber foods, such as dried fruit, raw fruits and vegetables, nuts, seeds, and whole grain foods. °¨ Spicy foods and high-fat foods. °¨ Foods and beverages sweetened with high-fructose corn syrup, honey, or sugar alcohols such as xylitol, sorbitol, and mannitol. °WHAT FOODS ARE RECOMMENDED? °Grains °White rice. White, French, or pita breads (fresh or toasted), including plain rolls, buns, or bagels. White pasta. Saltine, soda, or graham crackers. Pretzels. Low-fiber cereal. Cooked cereals made with water (such as cornmeal, farina, or cream cereals). Plain muffins. Matzo. Melba toast. Zwieback.  °Vegetables °Potatoes (without the skin). Strained tomato and vegetable juices. Most well-cooked and canned  vegetables without seeds. Tender lettuce. °Fruits °Cooked or canned applesauce, apricots, cherries, fruit cocktail, grapefruit, peaches, pears, or plums. Fresh bananas, apples without skin, cherries, grapes, cantaloupe, grapefruit, peaches, oranges, or plums.  °Meat and Other Protein Products °Baked or boiled chicken. Eggs. Tofu. Fish. Seafood. Smooth peanut butter. Ground or well-cooked tender beef, ham, veal, lamb, pork, or poultry.  °Dairy °Plain yogurt, kefir, and unsweetened liquid yogurt. Lactose-free milk, buttermilk, or soy milk. Plain hard cheese. °Beverages °Sport drinks. Clear broths. Diluted fruit juices (except prune). Regular, caffeine-free sodas such as ginger ale. Water. Decaffeinated teas. Oral rehydration solutions. Sugar-free beverages not sweetened with sugar alcohols. °Other °Bouillon, broth, or soups made from recommended foods.  °The items listed above may not be a complete list of recommended foods or beverages. Contact your dietitian for more options. °WHAT FOODS ARE NOT RECOMMENDED? °Grains °Whole grain, whole wheat, bran, or rye breads, rolls, pastas, crackers, and cereals. Wild or brown rice. Cereals that contain more than 2 g of fiber per serving. Corn tortillas or taco shells. Cooked or dry oatmeal. Granola. Popcorn. °Vegetables °Raw vegetables. Cabbage, broccoli, Brussels sprouts, artichokes, baked beans, beet greens, corn, kale, legumes, peas, sweet potatoes, and yams. Potato skins. Cooked spinach and cabbage. °Fruits °Dried fruit, including raisins and dates. Raw fruits. Stewed or dried prunes. Fresh apples with skin, apricots, mangoes, pears, raspberries, and strawberries.  °Meat and Other Protein Products °Chunky peanut butter. Nuts and seeds. Beans and lentils. Bacon.  °Dairy °High-fat cheeses. Milk, chocolate milk, and beverages made with milk, such as milk shakes. Cream. Ice cream. °Sweets and Desserts °Sweet rolls, doughnuts, and sweet breads.   Pancakes and waffles. Fats and  Oils Butter. Cream sauces. Margarine. Salad oils. Plain salad dressings. Olives. Avocados.  Beverages Caffeinated beverages (such as coffee, tea, soda, or energy drinks). Alcoholic beverages. Fruit juices with pulp. Prune juice. Soft drinks sweetened with high-fructose corn syrup or sugar alcohols. Other Coconut. Hot sauce. Chili powder. Mayonnaise. Gravy. Cream-based or milk-based soups.  The items listed above may not be a complete list of foods and beverages to avoid. Contact your dietitian for more information. WHAT SHOULD I DO IF I BECOME DEHYDRATED? Diarrhea can sometimes lead to dehydration. Signs of dehydration include dark urine and dry mouth and skin. If you think you are dehydrated, you should rehydrate with an oral rehydration solution. These solutions can be purchased at pharmacies, retail stores, or online.  Drink -1 cup (120-240 mL) of oral rehydration solution each time you have an episode of diarrhea. If drinking this amount makes your diarrhea worse, try drinking smaller amounts more often. For example, drink 1-3 tsp (5-15 mL) every 5-10 minutes.  A general rule for staying hydrated is to drink 1-2 L of fluid per day. Talk to your health care provider about the specific amount you should be drinking each day. Drink enough fluids to keep your urine clear or pale yellow.   This information is not intended to replace advice given to you by your health care provider. Make sure you discuss any questions you have with your health care provider.  Take the Phenergan as needed for nausea and vomiting. This will be the most important medication. Take Imodium right ear as needed for the diarrhea. Can supplement with hydrocodone as needed for some pain control. Would recommend drinking small amounts of fluids frequently with sugar and then advance to a bland diet. Would expect you to be improving over the next 1-2 days. Return for any new or worse symptoms.   Document Released: 03/25/2003  Document Revised: 01/23/2014 Document Reviewed: 11/25/2012 Elsevier Interactive Patient Education [employer] Inc.

## 2015-01-20 ENCOUNTER — Encounter (HOSPITAL_BASED_OUTPATIENT_CLINIC_OR_DEPARTMENT_OTHER): Payer: Self-pay

## 2015-01-20 ENCOUNTER — Emergency Department (HOSPITAL_BASED_OUTPATIENT_CLINIC_OR_DEPARTMENT_OTHER)
Admission: EM | Admit: 2015-01-20 | Discharge: 2015-01-20 | Disposition: A | Discharge status: Home / Self Care | Payer: BLUE CROSS/BLUE SHIELD | Attending: Emergency Medicine | Admitting: Emergency Medicine

## 2015-01-20 DIAGNOSIS — Z880 Allergy status to penicillin: Secondary | ICD-10-CM | POA: Insufficient documentation

## 2015-01-20 DIAGNOSIS — Z791 Long term (current) use of non-steroidal anti-inflammatories (NSAID): Secondary | ICD-10-CM | POA: Insufficient documentation

## 2015-01-20 DIAGNOSIS — R112 Nausea with vomiting, unspecified: Secondary | ICD-10-CM | POA: Insufficient documentation

## 2015-01-20 DIAGNOSIS — R630 Anorexia: Secondary | ICD-10-CM | POA: Insufficient documentation

## 2015-01-20 DIAGNOSIS — Z79899 Other long term (current) drug therapy: Secondary | ICD-10-CM | POA: Insufficient documentation

## 2015-01-20 DIAGNOSIS — I10 Essential (primary) hypertension: Secondary | ICD-10-CM | POA: Insufficient documentation

## 2015-01-20 DIAGNOSIS — R197 Diarrhea, unspecified: Secondary | ICD-10-CM | POA: Insufficient documentation

## 2015-01-20 DIAGNOSIS — F329 Major depressive disorder, single episode, unspecified: Secondary | ICD-10-CM | POA: Insufficient documentation

## 2015-01-20 LAB — URINALYSIS, ROUTINE W REFLEX MICROSCOPIC
BILIRUBIN URINE: NEGATIVE
Glucose, UA: NEGATIVE mg/dL
Hgb urine dipstick: NEGATIVE
KETONES UR: 15 mg/dL — AB
Leukocytes, UA: NEGATIVE
NITRITE: NEGATIVE
PH: 6 (ref 5.0–8.0)
PROTEIN: NEGATIVE mg/dL
Specific Gravity, Urine: 1.03 (ref 1.005–1.030)

## 2015-01-20 MED ORDER — ONDANSETRON 4 MG PO TBDP: 4.0000 mg | ORAL_TABLET | Freq: Three times a day (TID) | ORAL | Status: DC | PRN

## 2015-01-20 MED ORDER — SODIUM CHLORIDE 0.9 % IV BOLUS (SEPSIS)
1000.0000 mL | Freq: Once | INTRAVENOUS | Status: AC
  Administered 2015-01-20: 1000 mL via INTRAVENOUS

## 2015-01-20 MED ORDER — LOPERAMIDE HCL 2 MG PO TABS: 2.0000 mg | ORAL_TABLET | Freq: Four times a day (QID) | ORAL | Status: DC | PRN

## 2015-01-20 MED ORDER — ONDANSETRON HCL 4 MG/2ML IJ SOLN
4.0000 mg | Freq: Once | INTRAMUSCULAR | Status: AC
  Administered 2015-01-20: 4 mg via INTRAVENOUS
  Filled 2015-01-20: qty 2

## 2015-01-20 MED ORDER — PROMETHAZINE HCL 25 MG PO TABS: 25.0000 mg | ORAL_TABLET | Freq: Four times a day (QID) | ORAL | Status: DC | PRN

## 2015-01-20 MED ORDER — LOPERAMIDE HCL 2 MG PO CAPS
2.0000 mg | ORAL_CAPSULE | Freq: Once | ORAL | Status: AC
  Administered 2015-01-20: 2 mg via ORAL
  Filled 2015-01-20: qty 1

## 2015-01-20 NOTE — ED Notes (Signed)
abd pain, n/v/d since 0130am-hx of same approx weeks ago

## 2015-01-20 NOTE — Discharge Instructions (Signed)

## 2015-01-20 NOTE — ED Provider Notes (Signed)
CSN: [id number]     Arrival date & time 01/20/15  1208 History   First MD Initiated Contact with Patient 01/20/15 1453     Chief Complaint  Patient presents with  . Abdominal Pain     (Consider location/radiation/quality/duration/timing/severity/associated sxs/prior Treatment) Patient is a 50 y.o. male presenting with abdominal pain. The history is provided by the patient.  Abdominal Pain Pain location:  Generalized Associated symptoms: diarrhea, nausea and vomiting   Associated symptoms: no chest pain and no shortness of breath    patient developed nausea and vomiting and diarrhea last night. States that the nausea and vomiting is improved although still having some nausea. Continues have watery diarrhea. No fevers. Decreased appetite. States that he was in the ER for this on Christmas Eve and been better but started back up. States he works in a [employer] and he may have gotten it from that. He has no abdominal pain. States that the medicines he got last time worked and thinks he could use it again, but does not need the pain medicine.  Past Medical History  Diagnosis Date  . Depression   . Hypertension    History reviewed. No pertinent past surgical history. No family history on file. Social History  Substance Use Topics  . Smoking status: Never Smoker   . Smokeless tobacco: None  . Alcohol Use: No    Review of Systems  Constitutional: Positive for appetite change. Negative for activity change.  Eyes: Negative for pain.  Respiratory: Negative for chest tightness and shortness of breath.   Cardiovascular: Negative for chest pain and leg swelling.  Gastrointestinal: Positive for nausea, vomiting, abdominal pain and diarrhea.  Genitourinary: Negative for flank pain.  Musculoskeletal: Negative for back pain and neck stiffness.  Skin: Negative for rash.  Neurological: Negative for weakness, numbness and headaches.  Psychiatric/Behavioral: Negative for behavioral problems.       Allergies  Penicillins  Home Medications   Prior to Admission medications   Medication Sig Start Date End Date Taking? Authorizing Provider  azithromycin (ZITHROMAX Z-PAK) 250 MG tablet 2 po day one, then 1 daily x 4 days 04/16/14   Geoffery Lyons, MD  chlorpheniramine-HYDROcodone [facility]) 10-8 MG/5ML LQCR Take 5 mLs by mouth every 12 (twelve) hours as needed for cough. 04/16/14   Geoffery Lyons, MD  cyclobenzaprine (FLEXERIL) 10 MG tablet Take 0.5-1 tablets (5-10 mg total) by mouth 2 (two) times daily as needed for muscle spasms. 08/03/14   Tiffany Neva Seat, PA-C  doxycycline (VIBRAMYCIN) 100 MG capsule Take 1 capsule (100 mg total) by mouth 2 (two) times daily. One po bid x 7 days 06/10/14   April Palumbo, MD  guaiFENesin-codeine [facility]) 100-10 MG/5ML syrup Take 5 mLs by mouth 3 (three) times daily as needed for cough. 06/10/14   April Palumbo, MD  HYDROcodone-acetaminophen (NORCO/VICODIN) 5-325 MG tablet Take 1-2 tablets by mouth every 6 (six) hours as needed for moderate pain. 01/09/15   Vanetta Mulders, MD  hydrOXYzine (ATARAX/VISTARIL) 25 MG tablet Take 25 mg by mouth 3 (three) times daily as needed.    Historical Provider, MD  loperamide (IMODIUM A-D) 2 MG tablet Take 1 tablet (2 mg total) by mouth 4 (four) times daily as needed for diarrhea or loose stools. 01/20/15   Benjiman Core, MD  naproxen (NAPROSYN) 375 MG tablet Take 1 tablet (375 mg total) by mouth 2 (two) times daily. 06/10/14   April Palumbo, MD  naproxen (NAPROSYN) 500 MG tablet Take 1 tablet (500 mg total)  by mouth 2 (two) times daily. 08/03/14   Tiffany Neva SeatGreene, PA-C  ondansetron (ZOFRAN ODT) 4 MG disintegrating tablet Take 1 tablet (4 mg total) by mouth every 8 (eight) hours as needed for nausea. 01/20/15   Benjiman CoreNathan Jahari Billy, MD  promethazine (PHENERGAN) 25 MG tablet Take 1 tablet (25 mg total) by mouth every 6 (six) hours as needed for nausea or vomiting. 01/20/15   Benjiman CoreNathan Urie Loughner, MD  sertraline  (ZOLOFT) 50 MG tablet Take 50 mg by mouth daily.    Historical Provider, MD  traMADol (ULTRAM) 50 MG tablet Take 1 tablet (50 mg total) by mouth every 6 (six) hours as needed. 08/03/14   Tiffany Neva SeatGreene, PA-C   BP 139/87 mmHg  Pulse 68  Temp(Src) 98.4 F (36.9 C) (Oral)  Resp 16  Ht 6\' 1"  (1.854 m)  Wt 230 lb (104.327 kg)  BMI 30.35 kg/m2  SpO2 97% Physical Exam  Constitutional: He is oriented to person, place, and time. He appears well-developed and well-nourished.  HENT:  Head: Normocephalic.  Eyes: Pupils are equal, round, and reactive to light.  Neck: Normal range of motion. Neck supple.  Cardiovascular: Normal rate, regular rhythm and normal heart sounds.   No murmur heard. Pulmonary/Chest: Effort normal.  Abdominal: Soft. He exhibits no distension and no mass. There is tenderness. There is no rebound and no guarding.  Musculoskeletal: Normal range of motion.  Neurological: He is alert and oriented to person, place, and time.  Skin: Skin is warm.  Psychiatric: He has a normal mood and affect.  Nursing note and vitals reviewed.   ED Course  Procedures (including critical care time) Labs Review Labs Reviewed  URINALYSIS, ROUTINE W REFLEX MICROSCOPIC (NOT AT [facility]) - Abnormal; Notable for the following:    Ketones, ur 15 (*)    All other components within normal limits    Imaging Review No results found. I have personally reviewed and evaluated these images and lab results as part of my medical decision-making.   EKG Interpretation None      MDM   Final diagnoses:  Nausea vomiting and diarrhea    Patient nausea vomiting diarrhea. Feels better after treatment. Overall not very dehydrated. Will discharge home.    Benjiman CoreNathan Gabi Mcfate, MD 01/21/15 [phone]

## 2015-08-02 ENCOUNTER — Emergency Department (HOSPITAL_BASED_OUTPATIENT_CLINIC_OR_DEPARTMENT_OTHER)
Admission: EM | Admit: 2015-08-02 | Discharge: 2015-08-02 | Disposition: A | Discharge status: Home / Self Care | Payer: Commercial Managed Care - PPO | Attending: Emergency Medicine | Admitting: Emergency Medicine

## 2015-08-02 ENCOUNTER — Emergency Department (HOSPITAL_BASED_OUTPATIENT_CLINIC_OR_DEPARTMENT_OTHER): Payer: Commercial Managed Care - PPO

## 2015-08-02 ENCOUNTER — Encounter (HOSPITAL_BASED_OUTPATIENT_CLINIC_OR_DEPARTMENT_OTHER): Payer: Self-pay | Admitting: Emergency Medicine

## 2015-08-02 ENCOUNTER — Emergency Department (HOSPITAL_COMMUNITY): Payer: Commercial Managed Care - PPO

## 2015-08-02 DIAGNOSIS — E876 Hypokalemia: Secondary | ICD-10-CM | POA: Insufficient documentation

## 2015-08-02 DIAGNOSIS — R079 Chest pain, unspecified: Secondary | ICD-10-CM

## 2015-08-02 DIAGNOSIS — Z6832 Body mass index (BMI) 32.0-32.9, adult: Secondary | ICD-10-CM | POA: Insufficient documentation

## 2015-08-02 DIAGNOSIS — R29898 Other symptoms and signs involving the musculoskeletal system: Secondary | ICD-10-CM

## 2015-08-02 DIAGNOSIS — R42 Dizziness and giddiness: Secondary | ICD-10-CM | POA: Insufficient documentation

## 2015-08-02 DIAGNOSIS — I10 Essential (primary) hypertension: Secondary | ICD-10-CM | POA: Insufficient documentation

## 2015-08-02 DIAGNOSIS — E669 Obesity, unspecified: Secondary | ICD-10-CM | POA: Insufficient documentation

## 2015-08-02 DIAGNOSIS — R531 Weakness: Secondary | ICD-10-CM | POA: Insufficient documentation

## 2015-08-02 DIAGNOSIS — R0789 Other chest pain: Secondary | ICD-10-CM | POA: Insufficient documentation

## 2015-08-02 DIAGNOSIS — R200 Anesthesia of skin: Secondary | ICD-10-CM | POA: Diagnosis present

## 2015-08-02 LAB — CBC WITH DIFFERENTIAL/PLATELET
BASOS PCT: 0 %
Basophils Absolute: 0 10*3/uL (ref 0.0–0.1)
EOS ABS: 0.1 10*3/uL (ref 0.0–0.7)
EOS PCT: 2 %
HCT: 44.1 % (ref 39.0–52.0)
HEMOGLOBIN: 14.9 g/dL (ref 13.0–17.0)
Lymphocytes Relative: 41 %
Lymphs Abs: 2.2 10*3/uL (ref 0.7–4.0)
MCH: 28.9 pg (ref 26.0–34.0)
MCHC: 33.8 g/dL (ref 30.0–36.0)
MCV: 85.6 fL (ref 78.0–100.0)
MONOS PCT: 7 %
Monocytes Absolute: 0.4 10*3/uL (ref 0.1–1.0)
NEUTROS PCT: 50 %
Neutro Abs: 2.6 10*3/uL (ref 1.7–7.7)
PLATELETS: 220 10*3/uL (ref 150–400)
RBC: 5.15 MIL/uL (ref 4.22–5.81)
RDW: 13.6 % (ref 11.5–15.5)
WBC: 5.2 10*3/uL (ref 4.0–10.5)

## 2015-08-02 LAB — URINALYSIS, ROUTINE W REFLEX MICROSCOPIC
BILIRUBIN URINE: NEGATIVE
Glucose, UA: NEGATIVE mg/dL
Hgb urine dipstick: NEGATIVE
KETONES UR: NEGATIVE mg/dL
Leukocytes, UA: NEGATIVE
NITRITE: NEGATIVE
PROTEIN: NEGATIVE mg/dL
SPECIFIC GRAVITY, URINE: 1.015 (ref 1.005–1.030)
pH: 6 (ref 5.0–8.0)

## 2015-08-02 LAB — COMPREHENSIVE METABOLIC PANEL
ALBUMIN: 4.1 g/dL (ref 3.5–5.0)
ALT: 33 U/L (ref 17–63)
AST: 26 U/L (ref 15–41)
Alkaline Phosphatase: 74 U/L (ref 38–126)
Anion gap: 6 (ref 5–15)
BUN: 14 mg/dL (ref 6–20)
CHLORIDE: 107 mmol/L (ref 101–111)
CO2: 26 mmol/L (ref 22–32)
Calcium: 8.9 mg/dL (ref 8.9–10.3)
Creatinine, Ser: 0.8 mg/dL (ref 0.61–1.24)
GFR calc Af Amer: >60 mL/min (ref >60)
GLUCOSE: 101 mg/dL — AB (ref 65–99)
POTASSIUM: 3.4 mmol/L — AB (ref 3.5–5.1)
SODIUM: 139 mmol/L (ref 135–145)
TOTAL PROTEIN: 7.1 g/dL (ref 6.5–8.1)
Total Bilirubin: 0.5 mg/dL (ref 0.3–1.2)

## 2015-08-02 LAB — I-STAT TROPONIN, ED: TROPONIN I, POC: 0 ng/mL (ref 0.00–0.08)

## 2015-08-02 LAB — TROPONIN I: Troponin I: <0.03 ng/mL (ref <0.03)

## 2015-08-02 LAB — LIPASE, BLOOD: LIPASE: 20 U/L (ref 11–51)

## 2015-08-02 MED ORDER — IOPAMIDOL (ISOVUE-370) INJECTION 76%
100.0000 mL | Freq: Once | INTRAVENOUS | Status: AC | PRN
  Administered 2015-08-02: 100 mL via INTRAVENOUS

## 2015-08-02 MED ORDER — DIPHENHYDRAMINE HCL 50 MG/ML IJ SOLN
25.0000 mg | Freq: Once | INTRAMUSCULAR | Status: AC
  Administered 2015-08-02: 25 mg via INTRAVENOUS
  Filled 2015-08-02: qty 1

## 2015-08-02 MED ORDER — POTASSIUM CHLORIDE CRYS ER 20 MEQ PO TBCR
40.0000 meq | EXTENDED_RELEASE_TABLET | Freq: Once | ORAL | Status: AC
  Administered 2015-08-02: 40 meq via ORAL
  Filled 2015-08-02: qty 2

## 2015-08-02 MED ORDER — METOCLOPRAMIDE HCL 5 MG/ML IJ SOLN
10.0000 mg | Freq: Once | INTRAMUSCULAR | Status: AC
  Administered 2015-08-02: 10 mg via INTRAVENOUS
  Filled 2015-08-02: qty 2

## 2015-08-02 NOTE — ED Notes (Signed)
Pt asking to eat, called Dr. Judd Lienelo and he said that would be fine.

## 2015-08-02 NOTE — ED Notes (Signed)
Pt states he had apt at health dept to be swapped for an STD but started having numbness in left arm and leg and dizziness with high BP, health dept told to come to ED

## 2015-08-02 NOTE — ED Provider Notes (Addendum)
CSN: [id number]     Arrival date & time 08/02/15  1020 History   First MD Initiated Contact with Patient 08/02/15 1027     Chief Complaint  Patient presents with  . Numbness  . Dizziness     (Consider location/radiation/quality/duration/timing/severity/associated sxs/prior Treatment) HPI  50 year old male with past medical history of hypertension and depression who presents with acute onset of left upper arm and leg weakness, numbness, and chest pain. Pt states he was visiting friends over the weekend out of town. Two days ago, he noticed that his left arm began to feel "sore," but describes it as a weak feeling when moving his arm like he had "just worked out." The next day, he noticed heaviness and numbness/change of sensation in his arm and leg. He also states he was exposed to STDs orally during the weekend and though it may be related. He presented to the health dept today for STD testing when he was noted to have elevated BP to 160/100s per report. He was c/o left arm and leg weakness/numbness and was subsequently advised to present to the ED for evaluation. Currently, he endorses a mild heaviness, weakness sensation in his left arm and leg. He has a mild, generalized HA. No fevers. He also c/o mild, substernal chest pressure that his chest "is just tight, like I can't take a full breath."   Past Medical History  Diagnosis Date  . Depression   . Hypertension    History reviewed. No pertinent past surgical history. History reviewed. No pertinent family history. Social History  Substance Use Topics  . Smoking status: Never Smoker   . Smokeless tobacco: None  . Alcohol Use: No    Review of Systems  Constitutional: Positive for fatigue. Negative for fever and chills.  HENT: Negative for congestion and rhinorrhea.   Eyes: Negative for visual disturbance.  Respiratory: Positive for shortness of breath. Negative for cough and wheezing.   Cardiovascular: Positive for chest pain.  Negative for leg swelling.  Gastrointestinal: Negative for nausea, vomiting and diarrhea.  Genitourinary: Negative for dysuria and flank pain.  Musculoskeletal: Negative for neck pain.  Skin: Negative for rash.  Allergic/Immunologic: Negative for immunocompromised state.  Neurological: Positive for weakness, numbness and headaches. Negative for syncope.      Allergies  Penicillins  Home Medications   Prior to Admission medications   Medication Sig Start Date End Date Taking? Authorizing Provider  hydrOXYzine (ATARAX/VISTARIL) 25 MG tablet Take 25 mg by mouth 3 (three) times daily as needed.    Historical Provider, MD  sertraline (ZOLOFT) 50 MG tablet Take 50 mg by mouth daily.    Historical Provider, MD   BP 119/75 mmHg  Pulse 53  Temp(Src) 98.3 F (36.8 C) (Oral)  Resp 24  Ht 6\' 1"  (1.854 m)  Wt 250 lb (113.399 kg)  BMI 32.99 kg/m2  SpO2 98% Physical Exam  Constitutional: He is oriented to person, place, and time. He appears well-developed and well-nourished. No distress.  HENT:  Head: Normocephalic and atraumatic.  Mouth/Throat: No oropharyngeal exudate.  Eyes: Conjunctivae are normal. Pupils are equal, round, and reactive to light.  Neck: Normal range of motion. Neck supple.  No carotid bruits bilaterally  Cardiovascular: Normal rate, regular rhythm, normal heart sounds and intact distal pulses.  Exam reveals no friction rub.   No murmur heard. Pulmonary/Chest: Effort normal and breath sounds normal. No respiratory distress. He has no wheezes. He has no rales.  Abdominal: Soft. Bowel sounds are normal. He exhibits  no distension. There is no tenderness.  Musculoskeletal: He exhibits no edema.  Neurological: He is alert and oriented to person, place, and time.  Skin: Skin is warm. No rash noted.  Nursing note and vitals reviewed.   Neurological Exam:  - Mental Status: Alert and oriented to person, place, and time. Attention and concentration normal. Speech clear.  Recent memory is intact. - Cranial Nerves: Visual fields intact to confrontation in all quadrants bilaterally. EOMI and PERRLA. No nystagmus noted. Facial sensation intact at forehead, maxillary cheek, and chin/mandible bilaterally. No weakness of masticatory muscles. No facial asymmetry or weakness. Hearing grossly normal to finer rub. Uvula is midline, and palate elevates symmetrically. Normal SCM and trapezius strength. Tongue midline without fasciculations - Motor: Muscle strength 4+/5 in left proximal and UE and LE, 5/5 throughout right. - Reflexes: 2+ and symmetrical in all four extremities.  - Sensation: Intact to light touch  - Gait: Normal without ataxia. - Coordination: Normal FTN and HTS bilaterally, though HTS limited 2/2 weakness   ED Course  Procedures (including critical care time) Labs Review Labs Reviewed  COMPREHENSIVE METABOLIC PANEL - Abnormal; Notable for the following:    Potassium 3.4 (*)    Glucose, Bld 101 (*)    All other components within normal limits  LIPASE, BLOOD  TROPONIN I  CBC WITH DIFFERENTIAL/PLATELET  URINALYSIS, ROUTINE W REFLEX MICROSCOPIC (NOT AT [facility])  TROPONIN I  GC/CHLAMYDIA PROBE AMP ([place]) NOT AT [facility]    Imaging Review Dg Chest 2 View  08/02/2015  CLINICAL DATA:  Chest pain and dizziness EXAM: CHEST  2 VIEW COMPARISON:  Jun 10, 2014 FINDINGS: There is no edema or consolidation. The heart size and pulmonary vascularity are normal. No adenopathy. There is mild degenerative change in the thoracic spine. No pneumothorax. IMPRESSION: No edema or consolidation. Electronically Signed   By: Bretta BangWilliam  Woodruff III M.D.   On: 08/02/2015 11:33   Ct Head Wo Contrast  08/02/2015  CLINICAL DATA:  Left upper and lower extremity weakness. Hypertension. Dizziness. EXAM: CT HEAD WITHOUT CONTRAST TECHNIQUE: Contiguous axial images were obtained from the base of the skull through the vertex without intravenous contrast. COMPARISON:  Brain MRI November 26, 2012 FINDINGS: Brain: The ventricles are normal in size and configuration. There is no intracranial mass, hemorrhage, extra-axial fluid collection, or midline shift. Gray-white compartments are normal. No acute infarct evident. Vascular: No hyperdense vessel. There is slight calcification in the cavernous carotid artery on the left. Skull: The bony calvarium appears intact. Sinuses/Orbits: Visualized orbits appear symmetric bilaterally. Visualized paranasal sinuses are clear. Other: Mastoids are clear bilaterally. IMPRESSION: Slight calcification in the left cavernous carotid artery. No intracranial mass, hemorrhage, or focal gray -white compartment lesions/acute appearing infarct. Electronically Signed   By: Bretta BangWilliam  Woodruff III M.D.   On: 08/02/2015 11:29   Ct Angio Chest Aorta W/cm &/or Wo/cm  08/02/2015  CLINICAL DATA:  Chest pain, dizziness EXAM: CT ANGIOGRAPHY CHEST WITH CONTRAST TECHNIQUE: Multidetector CT imaging of the chest was performed using the standard protocol during bolus administration of intravenous contrast. Multiplanar CT image reconstructions and MIPs were obtained to evaluate the vascular anatomy. CONTRAST:  100 cc Isovue COMPARISON:  None. FINDINGS: Cardiovascular: The study is of excellent technical quality. There is no aortic aneurysm or dissection. Atherosclerotic plaques and calcifications are noted aortic wall. No pulmonary embolus is noted. Heart size within normal limits. No pericardial effusion. Mediastinum/Nodes: No mediastinal hematoma or adenopathy. No hilar adenopathy is noted. Central airways are patent. Lungs/Pleura:  Images of the lung parenchyma shows no acute infiltrate or pleural effusion. No pulmonary edema. No focal consolidation. There is no pneumothorax. Upper Abdomen: Mild fatty infiltration of the liver. No calcified gallstones are noted within gallbladder. Visualized liver is unremarkable. The spleen is unremarkable. No adrenal gland mass. No hydronephrosis. There  is probable cyst in lower pole of the left kidney measures 6 mm. Musculoskeletal: No destructive bony lesions are noted. Sagittal images of the spine shows mild degenerative changes mid and upper thoracic spine. Review of the MIP images confirms the above findings. IMPRESSION: 1. No aortic aneurysm or dissection.  No pulmonary embolus is noted. 2. No mediastinal hematoma or adenopathy. 3. No acute infiltrate or pulmonary edema. Mild atherosclerotic plaques and calcifications thoracic aorta. Electronically Signed   By: Natasha Mead M.D.   On: 08/02/2015 13:46   I have personally reviewed and evaluated these images and lab results as part of my medical decision-making.   EKG Interpretation None       EKG: Normal sinus rhythm, VR 66. PR 161, QRS 105, QTc 489. No EKG evidence of acute ischemia or infarct.  MDM   50 yo AAM with PMHx of obesity who presents with acute onset chest pain, SOB, and "heaviness" sensation in chest along with left arm and left leg weakness and numbness x 2 days. See HPI above. On arrival, VSS and WNL with exception of HTN to 156/97, which improved on rooming/reassurance. Exam is as above, remarkable for LUE and LLE 4+/5 weakness that is noticeable compared to right. Regarding his CP, EKG shows no acute ST segment changes, with no EKG evidence of acute ischemia or infarct. CXR unremarkable. DDx includes: ACS, GERD, must also consider aortic dissection given CP, neuro sx, and HTN at outside clinic. Will check labs, troponin, and CTA. ASA held due to c/f dissection. Otherwise, regarding weakness - DDx includes CVA, resolving TIA, also dissection. He does have subjective weakness on exam c/f CVA. Will check CT non-con, re-asess.  Labs, imaging as above. CBC shows no leukocytosis or aemia. CMP unremarkable with exception of mild hypoK, which is repleted. Lipase nromal. Trop negative. UA clear. CT A chest shows no PE, no dissection, no vascular abnormalities. Pt has no abdominal pain or  sx to suggest infrarenal aortic pathology. CT head negative. Pt continues to c/o weakness and heaviness with mild numbness LUE and LLE.  Discussed with Dr. Melinda Crutch, NeuroHospitalist. I feel pt would benefit from [facility] to eval for CVA given persistent left-sided sx. Will transfer to main campus for MRI. Otherwise, delta trop pending. CP improved at this time. HEAR score is 4 - will need delta trop, discussion with pt regarding further w/u as inpt versus outpt. Low suspicion for ACS at this time, however.  Regarding his possible STD exposure. No urinary symptoms. No pharyngeal sx. Will check tonsillar gc/c but hold on empiric tx at this time. Pt denies c/f HIV.  Clinical Impression: 1. Left arm weakness   2. Chest pain   3. Left leg weakness   4. Hypokalemia   5. Obesity     Disposition: Transfer  Condition: Medically stable for transfer   Shaune Pollack, MD 08/02/15 1553  Shaune Pollack, MD 08/02/15 [phone]

## 2015-08-02 NOTE — ED Notes (Signed)
Patient transported to X-ray 

## 2015-08-02 NOTE — ED Notes (Signed)
Patient transported to CT 

## 2015-08-02 NOTE — ED Provider Notes (Signed)
Patient sent here from [facility] for MRI of the brain. The patient presented there with multiple complaints including left leg and arm weakness and chest pain that started while he was undergoing screening for an STD at an outside clinic. He was then told to go to med center where a head CT, chest CT, and cardiac workup were obtained. These were all unremarkable. Due to his ongoing weakness and numbness in his left arm and leg he was sent here to have an MRI.   His MRI reveals no acute process. He has multiple foci of T2 hyperintensity within the bilateral frontal white matter, possibly related to chronic migraine headaches.  There is no evidence for stroke and he is neurologically intact at this time. I see no reason for further workup. He will be discharged with instructions to follow-up with his primary doctor.  Geoffery Lyonsouglas Fynlee Rowlands, MD 08/02/15 2059

## 2015-08-02 NOTE — ED Notes (Signed)
[facility] at bedside 

## 2015-08-02 NOTE — ED Notes (Signed)
PT already being transferred to [facility]. Med not given.

## 2015-08-02 NOTE — ED Notes (Addendum)
MD at bedside. Repeated Neuro assessment.

## 2015-08-02 NOTE — ED Notes (Signed)
MD at bedside. 

## 2015-08-02 NOTE — ED Notes (Signed)
Pt off unit for MRI

## 2015-08-02 NOTE — Discharge Instructions (Signed)
Follow-up with a primary Dr. in the next week, and return to the ER of symptoms significant worsen or change.   Nonspecific Chest Pain  Chest pain can be caused by many different conditions. There is always a chance that your pain could be related to something serious, such as a heart attack or a blood clot in your lungs. Chest pain can also be caused by conditions that are not life-threatening. If you have chest pain, it is very important to follow up with your health care provider. CAUSES  Chest pain can be caused by:  Heartburn.  Pneumonia or bronchitis.  Anxiety or stress.  Inflammation around your heart (pericarditis) or lung (pleuritis or pleurisy).  A blood clot in your lung.  A collapsed lung (pneumothorax). It can develop suddenly on its own (spontaneous pneumothorax) or from trauma to the chest.  Shingles infection (varicella-zoster virus).  Heart attack.  Damage to the bones, muscles, and cartilage that make up your chest wall. This can include:  Bruised bones due to injury.  Strained muscles or cartilage due to frequent or repeated coughing or overwork.  Fracture to one or more ribs.  Sore cartilage due to inflammation (costochondritis). RISK FACTORS  Risk factors for chest pain may include:  Activities that increase your risk for trauma or injury to your chest.  Respiratory infections or conditions that cause frequent coughing.  Medical conditions or overeating that can cause heartburn.  Heart disease or family history of heart disease.  Conditions or health behaviors that increase your risk of developing a blood clot.  Having had chicken pox (varicella zoster). SIGNS AND SYMPTOMS Chest pain can feel like:  Burning or tingling on the surface of your chest or deep in your chest.  Crushing, pressure, aching, or squeezing pain.  Dull or sharp pain that is worse when you move, cough, or take a deep breath.  Pain that is also felt in your back, neck,  shoulder, or arm, or pain that spreads to any of these areas. Your chest pain may come and go, or it may stay constant. DIAGNOSIS Lab tests or other studies may be needed to find the cause of your pain. Your health care provider may have you take a test called an ambulatory ECG (electrocardiogram). An ECG records your heartbeat patterns at the time the test is performed. You may also have other tests, such as:  Transthoracic echocardiogram (TTE). During echocardiography, sound waves are used to create a picture of all of the heart structures and to look at how blood flows through your heart.  Transesophageal echocardiogram (TEE).This is a more advanced imaging test that obtains images from inside your body. It allows your health care provider to see your heart in finer detail.  Cardiac monitoring. This allows your health care provider to monitor your heart rate and rhythm in real time.  Holter monitor. This is a portable device that records your heartbeat and can help to diagnose abnormal heartbeats. It allows your health care provider to track your heart activity for several days, if needed.  Stress tests. These can be done through exercise or by taking medicine that makes your heart beat more quickly.  Blood tests.  Imaging tests. TREATMENT  Your treatment depends on what is causing your chest pain. Treatment may include:  Medicines. These may include:  Acid blockers for heartburn.  Anti-inflammatory medicine.  Pain medicine for inflammatory conditions.  Antibiotic medicine, if an infection is present.  Medicines to dissolve blood clots.  Medicines to  treat coronary artery disease.  Supportive care for conditions that do not require medicines. This may include:  Resting.  Applying heat or cold packs to injured areas.  Limiting activities until pain decreases. HOME CARE INSTRUCTIONS  If you were prescribed an antibiotic medicine, finish it all even if you start to feel  better.  Avoid any activities that bring on chest pain.  Do not use any tobacco products, including cigarettes, chewing tobacco, or electronic cigarettes. If you need help quitting, ask your health care provider.  Do not drink alcohol.  Take medicines only as directed by your health care provider.  Keep all follow-up visits as directed by your health care provider. This is important. This includes any further testing if your chest pain does not go away.  If heartburn is the cause for your chest pain, you may be told to keep your head raised (elevated) while sleeping. This reduces the chance that acid will go from your stomach into your esophagus.  Make lifestyle changes as directed by your health care provider. These may include:  Getting regular exercise. Ask your health care provider to suggest some activities that are safe for you.  Eating a heart-healthy diet. A registered dietitian can help you to learn healthy eating options.  Maintaining a healthy weight.  Managing diabetes, if necessary.  Reducing stress. SEEK MEDICAL CARE IF:  Your chest pain does not go away after treatment.  You have a rash with blisters on your chest.  You have a fever. SEEK IMMEDIATE MEDICAL CARE IF:   Your chest pain is worse.  You have an increasing cough, or you cough up blood.  You have severe abdominal pain.  You have severe weakness.  You faint.  You have chills.  You have sudden, unexplained chest discomfort.  You have sudden, unexplained discomfort in your arms, back, neck, or jaw.  You have shortness of breath at any time.  You suddenly start to sweat, or your skin gets clammy.  You feel nauseous or you vomit.  You suddenly feel light-headed or dizzy.  Your heart begins to beat quickly, or it feels like it is skipping beats. These symptoms may represent a serious problem that is an emergency. Do not wait to see if the symptoms will go away. Get medical help right away.  Call your local emergency services (911 in the [place] Do not drive yourself to the hospital.   This information is not intended to replace advice given to you by your health care provider. Make sure you discuss any questions you have with your health care provider.   Document Released: 10/12/2004 Document Revised: 01/23/2014 Document Reviewed: 08/08/2013 Elsevier Interactive Patient Education [employer]2016 [employer] Inc.

## 2015-08-03 LAB — GC/CHLAMYDIA PROBE AMP (~~LOC~~) NOT AT ARMC
CHLAMYDIA, DNA PROBE: NEGATIVE
NEISSERIA GONORRHEA: NEGATIVE

## 2015-10-15 ENCOUNTER — Emergency Department (HOSPITAL_BASED_OUTPATIENT_CLINIC_OR_DEPARTMENT_OTHER)
Admission: EM | Admit: 2015-10-15 | Discharge: 2015-10-15 | Disposition: A | Discharge status: Home / Self Care | Payer: Commercial Managed Care - PPO | Attending: Emergency Medicine | Admitting: Emergency Medicine

## 2015-10-15 ENCOUNTER — Encounter (HOSPITAL_BASED_OUTPATIENT_CLINIC_OR_DEPARTMENT_OTHER): Payer: Self-pay

## 2015-10-15 DIAGNOSIS — I10 Essential (primary) hypertension: Secondary | ICD-10-CM | POA: Insufficient documentation

## 2015-10-15 DIAGNOSIS — J029 Acute pharyngitis, unspecified: Secondary | ICD-10-CM

## 2015-10-15 DIAGNOSIS — J40 Bronchitis, not specified as acute or chronic: Secondary | ICD-10-CM | POA: Diagnosis not present

## 2015-10-15 MED ORDER — DOXYCYCLINE HYCLATE 100 MG PO TABS
100.0000 mg | ORAL_TABLET | Freq: Once | ORAL | Status: AC
  Administered 2015-10-15: 100 mg via ORAL
  Filled 2015-10-15: qty 1

## 2015-10-15 MED ORDER — DOXYCYCLINE HYCLATE 100 MG PO CAPS: 100.0000 mg | ORAL_CAPSULE | Freq: Two times a day (BID) | ORAL | 0 refills | Status: DC

## 2015-10-15 NOTE — ED Provider Notes (Signed)
[facility]   CSN: [id number] Arrival date & time: 10/15/15  [id number]     History   Chief Complaint Chief Complaint  Patient presents with  . Sore Throat    HPI Randy Rose is a 50 y.o. male.  He has had fever shakes and chills since last night. He left work for home. Started become progressively painful. Her swallow or talk or breathe. Minimal cough. No GI complaints. Myalgias. Organizing joint pain or skin rash  HPI  Past Medical History:  Diagnosis Date  . Depression   . Hypertension     There are no active problems to display for this patient.   History reviewed. No pertinent surgical history.     Home Medications    Prior to Admission medications   Medication Sig Start Date End Date Taking? Authorizing Provider  doxycycline (VIBRAMYCIN) 100 MG capsule Take 1 capsule (100 mg total) by mouth 2 (two) times daily. 10/15/15   Rolland PorterMark Dilyn Osoria, MD  hydrOXYzine (ATARAX/VISTARIL) 25 MG tablet Take 25 mg by mouth 3 (three) times daily as needed.    Historical Provider, MD  sertraline (ZOLOFT) 50 MG tablet Take 50 mg by mouth daily.    Historical Provider, MD    Family History No family history on file.  Social History Social History  Substance Use Topics  . Smoking status: Never Smoker  . Smokeless tobacco: Not on file  . Alcohol use No     Allergies   Penicillins   Review of Systems Review of Systems  Constitutional: Positive for fever. Negative for appetite change, chills, diaphoresis and fatigue.  HENT: Positive for sore throat. Negative for mouth sores and trouble swallowing.   Eyes: Negative for visual disturbance.  Respiratory: Negative for cough, chest tightness, shortness of breath and wheezing.   Cardiovascular: Negative for chest pain.  Gastrointestinal: Negative for abdominal distention, abdominal pain, diarrhea, nausea and vomiting.  Endocrine: Negative for polydipsia, polyphagia and polyuria.  Genitourinary: Negative for  dysuria, frequency and hematuria.  Musculoskeletal: Positive for myalgias. Negative for gait problem.  Skin: Negative for color change, pallor and rash.  Neurological: Negative for dizziness, syncope, light-headedness and headaches.  Hematological: Does not bruise/bleed easily.  Psychiatric/Behavioral: Negative for behavioral problems and confusion.     Physical Exam Updated Vital Signs BP 154/100 (BP Location: Right Arm)   Pulse 89   Temp 98.5 F (36.9 C) (Oral)   Resp 18   Ht 6\' 1"  (1.854 m)   Wt 247 lb (112 kg)   SpO2 100%   BMI 32.59 kg/m   Physical Exam  Constitutional: He is oriented to person, place, and time. He appears well-developed and well-nourished. No distress.  HENT:  Head: Normocephalic.  Pharyngeal erythema with exudate. Tender large intracervical adenopathy.  Eyes: Conjunctivae are normal. Pupils are equal, round, and reactive to light. No scleral icterus.  Neck: Normal range of motion. Neck supple. No thyromegaly present.  Cardiovascular: Normal rate and regular rhythm.  Exam reveals no gallop and no friction rub.   No murmur heard. Pulmonary/Chest: Effort normal and breath sounds normal. No respiratory distress. He has no wheezes. He has no rales.  Clear lungs  Abdominal: Soft. Bowel sounds are normal. He exhibits no distension. There is no tenderness. There is no rebound.  Musculoskeletal: Normal range of motion.  Neurological: He is alert and oriented to person, place, and time.  Skin: Skin is warm and dry. No rash noted.  Psychiatric: He has a normal mood and affect.  His behavior is normal.     ED Treatments / Results  Labs (all labs ordered are listed, but only abnormal results are displayed) Labs Reviewed - No data to display  EKG  EKG Interpretation None       Radiology No results found.  Procedures Procedures (including critical care time)  Medications Ordered in ED Medications  doxycycline (VIBRA-TABS) tablet 100 mg (not  administered)     Initial Impression / Assessment and Plan / ED Course  I have reviewed the triage vital signs and the nursing notes.  Pertinent labs & imaging results that were available during my care of the patient were reviewed by me and considered in my medical decision making (see chart for details).  Clinical Course    Clinically appears to have streptococcal pharyngitis has multiple criteria. Allergic to penicillin. Thinks he may have had a reaction to Keflex. Given doxycycline prescription for the same.  Final Clinical Impressions(s) / ED Diagnoses   Final diagnoses:  Pharyngitis  Bronchitis    New Prescriptions New Prescriptions   DOXYCYCLINE (VIBRAMYCIN) 100 MG CAPSULE    Take 1 capsule (100 mg total) by mouth 2 (two) times daily.     Rolland Porter, MD 10/15/15 [phone]

## 2015-10-15 NOTE — ED Notes (Signed)
Pt verbalizes understanding of d/c instructions and denies any further needs at this time. 

## 2015-10-15 NOTE — ED Triage Notes (Signed)
Pt c/o sore throat, painful swallowing, cough and chills that started earlier this morning.

## 2015-11-19 ENCOUNTER — Emergency Department (HOSPITAL_BASED_OUTPATIENT_CLINIC_OR_DEPARTMENT_OTHER): Payer: Commercial Managed Care - PPO

## 2015-11-19 ENCOUNTER — Emergency Department (HOSPITAL_BASED_OUTPATIENT_CLINIC_OR_DEPARTMENT_OTHER)
Admission: EM | Admit: 2015-11-19 | Discharge: 2015-11-19 | Disposition: A | Discharge status: Home / Self Care | Payer: Commercial Managed Care - PPO | Attending: Emergency Medicine | Admitting: Emergency Medicine

## 2015-11-19 ENCOUNTER — Encounter (HOSPITAL_BASED_OUTPATIENT_CLINIC_OR_DEPARTMENT_OTHER): Payer: Self-pay

## 2015-11-19 DIAGNOSIS — I10 Essential (primary) hypertension: Secondary | ICD-10-CM | POA: Insufficient documentation

## 2015-11-19 DIAGNOSIS — M549 Dorsalgia, unspecified: Secondary | ICD-10-CM | POA: Diagnosis not present

## 2015-11-19 DIAGNOSIS — Y9301 Activity, walking, marching and hiking: Secondary | ICD-10-CM | POA: Diagnosis not present

## 2015-11-19 DIAGNOSIS — S40022A Contusion of left upper arm, initial encounter: Secondary | ICD-10-CM | POA: Diagnosis not present

## 2015-11-19 DIAGNOSIS — Y929 Unspecified place or not applicable: Secondary | ICD-10-CM | POA: Diagnosis not present

## 2015-11-19 DIAGNOSIS — Y990 Civilian activity done for income or pay: Secondary | ICD-10-CM | POA: Diagnosis not present

## 2015-11-19 DIAGNOSIS — W208XXA Other cause of strike by thrown, projected or falling object, initial encounter: Secondary | ICD-10-CM | POA: Diagnosis not present

## 2015-11-19 DIAGNOSIS — R52 Pain, unspecified: Secondary | ICD-10-CM

## 2015-11-19 DIAGNOSIS — W19XXXA Unspecified fall, initial encounter: Secondary | ICD-10-CM

## 2015-11-19 DIAGNOSIS — S4992XA Unspecified injury of left shoulder and upper arm, initial encounter: Secondary | ICD-10-CM | POA: Diagnosis present

## 2015-11-19 MED ORDER — METHOCARBAMOL 500 MG PO TABS: 500.0000 mg | ORAL_TABLET | Freq: Two times a day (BID) | ORAL | 0 refills | Status: DC

## 2015-11-19 MED ORDER — NAPROXEN 375 MG PO TABS: 375.0000 mg | ORAL_TABLET | Freq: Two times a day (BID) | ORAL | 0 refills | Status: DC

## 2015-11-19 NOTE — ED Notes (Signed)
Pt verbalizes understanding of d/c instructions and denies any further needs at this time. 

## 2015-11-19 NOTE — ED Triage Notes (Signed)
Pt states he was standing at work and the floor caved in and he fell from a standing position.  Pt c/o tingling and pain in left shoulder and c/o left knee pain as well as left lower back pain.  Pt is ambulatory without issue.  Pt talked with supervisor and states he does not need a UDS or breathalyzer.

## 2015-11-19 NOTE — ED Notes (Signed)
Pt called supervisor Neal DyLuis Carillo, he state by speaker phone that Pt did not need UDS and RN Maralyn SagoSarah was present.

## 2015-11-19 NOTE — ED Provider Notes (Addendum)
[facility]   CSN: [id number] Arrival date & time: 11/19/15  0046     History   Chief Complaint Chief Complaint  Patient presents with  . Fall    HPI Randy Rose is a 50 y.o. male.  The history is provided by the patient.  Fall  This is a new problem. The current episode started 1 to 2 hours ago. The problem occurs constantly. The problem has not changed since onset.Pertinent negatives include no chest pain, no abdominal pain, no headaches and no shortness of breath. Nothing aggravates the symptoms. Nothing relieves the symptoms. He has tried nothing for the symptoms. The treatment provided no relief.  Shoulder Injury  This is a new problem. The current episode started 1 to 2 hours ago. The problem occurs constantly. The problem has not changed since onset.Pertinent negatives include no chest pain, no abdominal pain, no headaches and no shortness of breath. Nothing aggravates the symptoms. Nothing relieves the symptoms. He has tried nothing for the symptoms. The treatment provided no relief.  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  Did not strike head no LOC no vomiting no seizure like activity.  Has left knee shoulder and L spine pain.  No weakness no numbness  Past Medical History:  Diagnosis Date  . Depression   . Hypertension     There are no active problems to display for this patient.   History reviewed. No pertinent surgical history.     Home Medications    Prior to Admission medications   Medication Sig Start Date End Date Taking? Authorizing Provider  doxycycline (VIBRAMYCIN) 100 MG capsule Take 1 capsule (100 mg total) by mouth 2 (two) times daily. 10/15/15   Rolland PorterMark James, MD  hydrOXYzine (ATARAX/VISTARIL) 25 MG tablet Take 25 mg by mouth 3 (three) times daily as needed.    Historical Provider, MD  methocarbamol (ROBAXIN) 500 MG tablet Take 1 tablet (500 mg total) by mouth 2 (two) times daily.  11/19/15   Mitra Duling, MD  naproxen (NAPROSYN) 375 MG tablet Take 1 tablet (375 mg total) by mouth 2 (two) times daily. 11/19/15   Kenith Trickel, MD  sertraline (ZOLOFT) 50 MG tablet Take 50 mg by mouth daily.    Historical Provider, MD    Family History No family history on file.  Social History Social History  Substance Use Topics  . Smoking status: Never Smoker  . Smokeless tobacco: Not on file  . Alcohol use No     Allergies   Penicillins   Review of Systems Review of Systems  HENT: Negative for ear discharge.   Eyes: Negative for visual disturbance.  Respiratory: Negative for shortness of breath.   Cardiovascular: Negative for chest pain.  Gastrointestinal: Negative for abdominal pain, nausea and vomiting.  Musculoskeletal: Positive for arthralgias and back pain. Negative for gait problem, joint swelling, neck pain and neck stiffness.  Neurological: Negative for dizziness, seizures, facial asymmetry, weakness, light-headedness, numbness and headaches.  All other systems reviewed and are negative.    Physical Exam Updated Vital Signs BP (!) 156/107 (BP Location: Right Arm)   Pulse 86   Temp 97.5 F (36.4 C) (Oral)   Resp 16   SpO2 96%   Physical Exam  Constitutional: He is oriented to person, place, and time. He appears well-developed and well-nourished. No distress.  HENT:  Head: Normocephalic and atraumatic. Head is without raccoon's eyes and without Battle's sign.  Right Ear: No mastoid tenderness. No hemotympanum.  Left Ear: No mastoid tenderness. No hemotympanum.  Nose: Nose normal.  Mouth/Throat: No oropharyngeal exudate.  Eyes: Conjunctivae are normal. Pupils are equal, round, and reactive to light.  Neck: Normal range of motion. Neck supple. No JVD present.  Cardiovascular: Normal rate, regular rhythm and intact distal pulses.   Pulmonary/Chest: Effort normal and breath sounds normal. No stridor. He has no wheezes. He has no rales. He exhibits no  tenderness.  Abdominal: Soft. Bowel sounds are normal. He exhibits no mass. There is no tenderness. There is no rebound and no guarding.  Musculoskeletal: Normal range of motion. He exhibits no edema or deformity.       Left shoulder: Normal.       Left elbow: Normal.       Left wrist: Normal.       Left hip: Normal.       Left knee: Normal. He exhibits normal range of motion, no swelling, no effusion, no ecchymosis, no deformity, no laceration, no erythema, normal alignment, no LCL laxity, normal patellar mobility, no bony tenderness, normal meniscus and no MCL laxity. No tenderness found. No medial joint line, no lateral joint line, no MCL, no LCL and no patellar tendon tenderness noted.       Cervical back: Normal.       Thoracic back: Normal.       Lumbar back: Normal.       Left hand: Normal. Normal sensation noted.  Neurological: He is alert and oriented to person, place, and time. He has normal reflexes. He displays normal reflexes.  Skin: Skin is warm and dry. Capillary refill takes less than 2 seconds.  Psychiatric: He has a normal mood and affect.     ED Treatments / Results   Vitals:   11/19/15 0058  BP: (!) 156/107  Pulse: 86  Resp: 16  Temp: 97.5 F (36.4 C)    Radiology Dg Lumbar Spine Complete  Result Date: 11/19/2015 CLINICAL DATA:  Larey SeatFell off palates at work last night. EXAM: LUMBAR SPINE - COMPLETE 4+ VIEW COMPARISON:  None. FINDINGS: There is no evidence of lumbar spine fracture. Alignment is normal. Intervertebral disc spaces are maintained. IMPRESSION: Negative. Electronically Signed   By: Ellery Plunkaniel R Mitchell M.D.   On: 11/19/2015 02:01   Dg Shoulder Left  Result Date: 11/19/2015 CLINICAL DATA:  Pain after falling earlier tonight EXAM: LEFT SHOULDER - 2+ VIEW COMPARISON:  None. FINDINGS: There is no evidence of fracture or dislocation. There is no evidence of arthropathy or other focal bone abnormality. Soft tissues are unremarkable. IMPRESSION: Negative.  Electronically Signed   By: Ellery Plunkaniel R Mitchell M.D.   On: 11/19/2015 02:50   Dg Knee Complete 4 Views Left  Result Date: 11/19/2015 CLINICAL DATA:  Larey SeatFell off pallets at work last night. EXAM: LEFT KNEE - COMPLETE 4+ VIEW COMPARISON:  None. FINDINGS: No evidence of fracture, dislocation, or joint effusion. No evidence of arthropathy or other focal bone abnormality. Soft tissues are unremarkable. IMPRESSION: Negative. Electronically Signed   By: Ellery Plunkaniel R Mitchell M.D.   On: 11/19/2015 02:01    Procedures Procedures (including critical care time)   Final Clinical Impressions(s) / ED Diagnoses   Final diagnoses:  Fall, initial encounter  Contusion of left upper arm, initial encounter    New Prescriptions Discharge Medication List as of 11/19/2015  3:10 AM    START taking these medications   Details  methocarbamol (ROBAXIN) 500 MG tablet Take 1 tablet (500 mg  total) by mouth 2 (two) times daily., Starting Fri 11/19/2015, Print    naproxen (NAPROSYN) 375 MG tablet Take 1 tablet (375 mg total) by mouth 2 (two) times daily., Starting Fri 11/19/2015, Print      All questions answered to patient's satisfaction. Based on history and exam patient has been appropriately medically screened and emergency conditions excluded. Patient is stable for discharge at this time. Follow up with your PMD for recheck in 2 days and strict return precautions given.    Cy Blamer, MD 11/19/15 1610    Varie Machamer, MD 11/19/15 [phone]

## 2015-12-28 DIAGNOSIS — M25512 Pain in left shoulder: Secondary | ICD-10-CM | POA: Insufficient documentation

## 2015-12-28 DIAGNOSIS — M25562 Pain in left knee: Secondary | ICD-10-CM | POA: Insufficient documentation

## 2016-01-10 ENCOUNTER — Encounter (HOSPITAL_BASED_OUTPATIENT_CLINIC_OR_DEPARTMENT_OTHER): Payer: Self-pay | Admitting: Emergency Medicine

## 2016-01-10 ENCOUNTER — Emergency Department (HOSPITAL_BASED_OUTPATIENT_CLINIC_OR_DEPARTMENT_OTHER)
Admission: EM | Admit: 2016-01-10 | Discharge: 2016-01-10 | Disposition: A | Discharge status: Home / Self Care | Payer: Commercial Managed Care - PPO | Attending: Emergency Medicine | Admitting: Emergency Medicine

## 2016-01-10 DIAGNOSIS — J020 Streptococcal pharyngitis: Secondary | ICD-10-CM | POA: Diagnosis not present

## 2016-01-10 DIAGNOSIS — Z79899 Other long term (current) drug therapy: Secondary | ICD-10-CM | POA: Diagnosis not present

## 2016-01-10 DIAGNOSIS — I10 Essential (primary) hypertension: Secondary | ICD-10-CM | POA: Diagnosis not present

## 2016-01-10 DIAGNOSIS — J029 Acute pharyngitis, unspecified: Secondary | ICD-10-CM | POA: Diagnosis present

## 2016-01-10 LAB — RAPID STREP SCREEN (MED CTR MEBANE ONLY): STREPTOCOCCUS, GROUP A SCREEN (DIRECT): POSITIVE — AB

## 2016-01-10 MED ORDER — CLINDAMYCIN HCL 150 MG PO CAPS: 300.0000 mg | ORAL_CAPSULE | Freq: Three times a day (TID) | ORAL | 0 refills | Status: DC

## 2016-01-10 NOTE — ED Triage Notes (Signed)
Sore throat x 3 days

## 2016-01-10 NOTE — ED Provider Notes (Signed)
[facility]   CSN: [id number] Arrival date & time: 01/10/16  0945     History   Chief Complaint Chief Complaint  Patient presents with  . Sore Throat    HPI Randy Rose is a 50 y.o. male with 3 days of worsening sore throat. He denies cough, fevers, chills. He denies a history of strep throat. Patient states that swallowing, feels like "razor blades" in his throat. He acknowledges tender swollen tonsillar adenopathy. He denies changes in voice, changes in voice, inability to swallow his own saliva. HPI  Past Medical History:  Diagnosis Date  . Depression   . Hypertension     There are no active problems to display for this patient.   History reviewed. No pertinent surgical history.     Home Medications    Prior to Admission medications   Medication Sig Start Date End Date Taking? Authorizing Provider  hydrOXYzine (ATARAX/VISTARIL) 25 MG tablet Take 25 mg by mouth 3 (three) times daily as needed.    Historical Provider, MD  sertraline (ZOLOFT) 50 MG tablet Take 50 mg by mouth daily.    Historical Provider, MD    Family History History reviewed. No pertinent family history.  Social History Social History  Substance Use Topics  . Smoking status: Never Smoker  . Smokeless tobacco: Never Used  . Alcohol use No     Allergies   Penicillins   Review of Systems Review of Systems Ten systems are reviewed and are negative for acute change except as noted in the HPI Physical Exam Updated Vital Signs BP 141/98   Pulse 94   Temp 98.8 F (37.1 C) (Oral)   Resp 14   Ht 6\' 1"  (1.854 m)   Wt 116.1 kg   SpO2 98%   BMI 33.78 kg/m   Physical Exam  Constitutional: He appears well-developed and well-nourished. No distress.  HENT:  Head: Normocephalic and atraumatic.  Mild swelling in the posterior oropharynx with erythema. No exudates.  Eyes: Conjunctivae and EOM are normal. Pupils are equal, round, and reactive to light. No  scleral icterus.  Neck: Normal range of motion. Neck supple.  Cardiovascular: Normal rate, regular rhythm and normal heart sounds.   Pulmonary/Chest: Effort normal and breath sounds normal. No respiratory distress.  Abdominal: Soft. There is no tenderness.  Musculoskeletal: He exhibits no edema.  Lymphadenopathy:    He has cervical adenopathy.  Neurological: He is alert.  Skin: Skin is warm and dry. He is not diaphoretic.  Psychiatric: His behavior is normal.  Nursing note and vitals reviewed.    ED Treatments / Results  Labs (all labs ordered are listed, but only abnormal results are displayed) Labs Reviewed  RAPID STREP SCREEN (NOT AT [facility]) - Abnormal; Notable for the following:       Result Value   Streptococcus, Group A Screen (Direct) POSITIVE (*)    All other components within normal limits    EKG  EKG Interpretation None       Radiology No results found.  Procedures Procedures (including critical care time)  Medications Ordered in ED Medications - No data to display   Initial Impression / Assessment and Plan / ED Course  I have reviewed the triage vital signs and the nursing notes.  Pertinent labs & imaging results that were available during my care of the patient were reviewed by me and considered in my medical decision making (see chart for details).  Clinical Course     Social with  positive strep test. He is allergic to penicillin with facial swelling. He will be discharged with oral clindamycin, supportive care and return precautions discussed with the patient. He appears safe for discharge at this time  Final Clinical Impressions(s) / ED Diagnoses   Final diagnoses:  None    New Prescriptions New Prescriptions   No medications on file     Arthor Captainbigail Myan Locatelli, PA-C 01/10/16 1058    Alvira MondayErin Schlossman, MD 01/10/16 2352

## 2016-01-10 NOTE — Discharge Instructions (Signed)
Get help right away if: °You have new symptoms, such as vomiting, severe headache, stiff or painful neck, chest pain, or shortness of breath. °You have severe throat pain, drooling, or changes in your voice. °You have swelling of the neck, or the skin on the neck becomes red and tender. °You have signs of dehydration, such as fatigue, dry mouth, and decreased urination. °You become increasingly sleepy, or you cannot wake up completely. °Your joints become red or painful. °

## 2016-01-24 DIAGNOSIS — M7542 Impingement syndrome of left shoulder: Secondary | ICD-10-CM | POA: Insufficient documentation

## 2016-01-24 DIAGNOSIS — M2242 Chondromalacia patellae, left knee: Secondary | ICD-10-CM | POA: Insufficient documentation

## 2016-02-18 ENCOUNTER — Emergency Department (HOSPITAL_BASED_OUTPATIENT_CLINIC_OR_DEPARTMENT_OTHER)
Admission: EM | Admit: 2016-02-18 | Discharge: 2016-02-18 | Disposition: A | Discharge status: Home / Self Care | Payer: Commercial Managed Care - PPO | Attending: Emergency Medicine | Admitting: Emergency Medicine

## 2016-02-18 ENCOUNTER — Emergency Department (HOSPITAL_BASED_OUTPATIENT_CLINIC_OR_DEPARTMENT_OTHER): Payer: Commercial Managed Care - PPO

## 2016-02-18 ENCOUNTER — Encounter (HOSPITAL_BASED_OUTPATIENT_CLINIC_OR_DEPARTMENT_OTHER): Payer: Self-pay | Admitting: Emergency Medicine

## 2016-02-18 DIAGNOSIS — R6883 Chills (without fever): Secondary | ICD-10-CM | POA: Insufficient documentation

## 2016-02-18 DIAGNOSIS — R110 Nausea: Secondary | ICD-10-CM | POA: Insufficient documentation

## 2016-02-18 DIAGNOSIS — R05 Cough: Secondary | ICD-10-CM | POA: Insufficient documentation

## 2016-02-18 DIAGNOSIS — R079 Chest pain, unspecified: Secondary | ICD-10-CM | POA: Insufficient documentation

## 2016-02-18 DIAGNOSIS — R61 Generalized hyperhidrosis: Secondary | ICD-10-CM | POA: Insufficient documentation

## 2016-02-18 DIAGNOSIS — R059 Cough, unspecified: Secondary | ICD-10-CM

## 2016-02-18 LAB — BASIC METABOLIC PANEL
Anion gap: 7 (ref 5–15)
BUN: 12 mg/dL (ref 6–20)
CO2: 30 mmol/L (ref 22–32)
Calcium: 9.2 mg/dL (ref 8.9–10.3)
Chloride: 104 mmol/L (ref 101–111)
Creatinine, Ser: 0.93 mg/dL (ref 0.61–1.24)
GFR calc Af Amer: >60 mL/min (ref >60)
GFR calc non Af Amer: >60 mL/min (ref >60)
Glucose, Bld: 86 mg/dL (ref 65–99)
Potassium: 4.1 mmol/L (ref 3.5–5.1)
Sodium: 141 mmol/L (ref 135–145)

## 2016-02-18 LAB — CBC WITH DIFFERENTIAL/PLATELET
Basophils Absolute: 0 10*3/uL (ref 0.0–0.1)
Basophils Relative: 0 %
Eosinophils Absolute: 0.1 10*3/uL (ref 0.0–0.7)
Eosinophils Relative: 1 %
HCT: 46.8 % (ref 39.0–52.0)
Hemoglobin: 15.5 g/dL (ref 13.0–17.0)
Lymphocytes Relative: 28 %
Lymphs Abs: 1.9 10*3/uL (ref 0.7–4.0)
MCH: 28.9 pg (ref 26.0–34.0)
MCHC: 33.1 g/dL (ref 30.0–36.0)
MCV: 87.3 fL (ref 78.0–100.0)
Monocytes Absolute: 0.5 10*3/uL (ref 0.1–1.0)
Monocytes Relative: 7 %
Neutro Abs: 4.4 10*3/uL (ref 1.7–7.7)
Neutrophils Relative %: 64 %
Platelets: 226 10*3/uL (ref 150–400)
RBC: 5.36 MIL/uL (ref 4.22–5.81)
RDW: 14.2 % (ref 11.5–15.5)
WBC: 6.9 10*3/uL (ref 4.0–10.5)

## 2016-02-18 LAB — D-DIMER, QUANTITATIVE: D-Dimer, Quant: 0.38 ug/mL-FEU (ref 0.00–0.50)

## 2016-02-18 MED ORDER — ALBUTEROL SULFATE HFA 108 (90 BASE) MCG/ACT IN AERS
1.0000 | INHALATION_SPRAY | Freq: Once | RESPIRATORY_TRACT | Status: AC
  Administered 2016-02-18: 2 via RESPIRATORY_TRACT
  Filled 2016-02-18: qty 6.7

## 2016-02-18 MED ORDER — PREDNISONE 10 MG (21) PO TBPK: 10.0000 mg | ORAL_TABLET | Freq: Every day | ORAL | 0 refills | Status: DC

## 2016-02-18 MED ORDER — SODIUM CHLORIDE 0.9 % IV BOLUS (SEPSIS)
1000.0000 mL | Freq: Once | INTRAVENOUS | Status: AC
  Administered 2016-02-18: 1000 mL via INTRAVENOUS

## 2016-02-18 MED ORDER — ONDANSETRON HCL 4 MG PO TABS: 4.0000 mg | ORAL_TABLET | Freq: Four times a day (QID) | ORAL | 0 refills | Status: DC

## 2016-02-18 MED ORDER — BENZONATATE 100 MG PO CAPS: 100.0000 mg | ORAL_CAPSULE | Freq: Three times a day (TID) | ORAL | 0 refills | Status: DC

## 2016-02-18 NOTE — Discharge Instructions (Signed)
Medications: Prednisone, Tessalon, Zofran, albuterol inhaler  Treatment: Take prednisone as prescribed for 12 days. Take Tessalon every 8 hours as needed for cough. Take Zofran every 6 hours as needed for nausea or vomiting. Use albuterol inhaler every 4-6 hours as needed for cough, shortness of breath, or wheezing.  Follow-up: Please follow-up and establish care with a primary care provider by calling the number circled on your discharge paperwork. Please return to the emergency department if you develop any new or worsening symptoms.

## 2016-02-18 NOTE — ED Provider Notes (Signed)
[facility]   CSN: [id number] Arrival date & time: 02/18/16  1605     History   Chief Complaint Chief Complaint  Patient presents with  . Cough    HPI Randy Rose is a 51 y.o. male with history of depression who presents with a three-week history of cough. Patient reports symptoms began after he was diagnosed with strep throat 3 weeks ago. He states that most the time his cough is productive. Patient has had associated burning in his chest, but no severe chest pain. He denies pleuritic symptoms. Patient states he has had some sweats at night. He is also had associated nausea at night, but no vomiting. He denies any abdominal pain. Patient's been taking DayQuil over-the-counter without significant relief. Patient denies objective fever. Patient denies any recent long trips, immobilizations, cancer, or new leg pain or swelling. He also denies any shortness of breath, abdominal pain, urinary symptoms.  HPI  Past Medical History:  Diagnosis Date  . Depression     There are no active problems to display for this patient.   History reviewed. No pertinent surgical history.     Home Medications    Prior to Admission medications   Medication Sig Start Date End Date Taking? Authorizing Provider  benzonatate (TESSALON) 100 MG capsule Take 1 capsule (100 mg total) by mouth every 8 (eight) hours. 02/18/16   Emi HolesAlexandra M Solon Alban, PA-C  clindamycin (CLEOCIN) 150 MG capsule Take 2 capsules (300 mg total) by mouth 3 (three) times daily. May dispense as 150mg  capsules 01/10/16   Arthor CaptainAbigail Harris, PA-C  hydrOXYzine (ATARAX/VISTARIL) 25 MG tablet Take 25 mg by mouth 3 (three) times daily as needed.    Historical Provider, MD  ondansetron (ZOFRAN) 4 MG tablet Take 1 tablet (4 mg total) by mouth every 6 (six) hours. 02/18/16   Emi HolesAlexandra M Alyah Boehning, PA-C  predniSONE (STERAPRED UNI-PAK 21 TAB) 10 MG (21) TBPK tablet Take 1 tablet (10 mg total) by mouth daily. Take 6 tabs by mouth daily   for 2 days, then 5 tabs for 2 days, then 4 tabs for 2 days, then 3 tabs for 2 days, 2 tabs for 2 days, then 1 tab by mouth daily for 2 days 02/18/16   Emi HolesAlexandra M Oluwatimilehin Balfour, PA-C  sertraline (ZOLOFT) 50 MG tablet Take 50 mg by mouth daily.    Historical Provider, MD    Family History No family history on file.  Social History Social History  Substance Use Topics  . Smoking status: Never Smoker  . Smokeless tobacco: Never Used  . Alcohol use No     Allergies   Penicillins   Review of Systems Review of Systems  Constitutional: Positive for chills. Negative for fever.  HENT: Negative for facial swelling and sore throat.   Respiratory: Positive for cough. Negative for shortness of breath.   Cardiovascular: Positive for chest pain (burning).  Gastrointestinal: Positive for nausea. Negative for abdominal pain and vomiting.  Genitourinary: Negative for dysuria.  Musculoskeletal: Negative for back pain.  Skin: Negative for rash and wound.  Neurological: Negative for headaches.  Psychiatric/Behavioral: The patient is not nervous/anxious.      Physical Exam Updated Vital Signs BP 130/92 (BP Location: Left Arm)   Pulse 105   Temp 98.7 F (37.1 C) (Oral)   Resp 18   Ht 6\' 1"  (1.854 m)   Wt 118.8 kg   SpO2 96%   BMI 34.57 kg/m   Physical Exam  Constitutional: He appears well-developed and  well-nourished. No distress.  HENT:  Head: Normocephalic and atraumatic.  Mouth/Throat: Oropharynx is clear and moist. No oropharyngeal exudate.  Eyes: Conjunctivae are normal. Pupils are equal, round, and reactive to light. Right eye exhibits no discharge. Left eye exhibits no discharge. No scleral icterus.  Neck: Normal range of motion. Neck supple. No thyromegaly present.  Cardiovascular: Normal rate, regular rhythm, normal heart sounds and intact distal pulses.  Exam reveals no gallop and no friction rub.   No murmur heard. Pulmonary/Chest: Effort normal and breath sounds normal. No stridor.  No respiratory distress. He has no wheezes. He has no rales.  Abdominal: Soft. Bowel sounds are normal. He exhibits no distension. There is no tenderness. There is no rebound and no guarding.  Musculoskeletal: He exhibits no edema.  Lymphadenopathy:    He has no cervical adenopathy.  Neurological: He is alert. Coordination normal.  Skin: Skin is warm and dry. No rash noted. He is not diaphoretic. No pallor.  Psychiatric: He has a normal mood and affect.  Nursing note and vitals reviewed.    ED Treatments / Results  Labs (all labs ordered are listed, but only abnormal results are displayed) Labs Reviewed  BASIC METABOLIC PANEL  CBC WITH DIFFERENTIAL/PLATELET  D-DIMER, QUANTITATIVE (NOT AT [facility])    EKG  EKG Interpretation  Date/Time:  Friday February 18 2016 17:43:51 EST Ventricular Rate:  100 PR Interval:    QRS Duration: 92 QT Interval:  340 QTC Calculation: 439 R Axis:   43 Text Interpretation:  Sinus tachycardia RSR' in V1 or V2, right VCD or RVH ST elev, probable normal early repol pattern Baseline wander in lead(s) V1 since last tracing no significant change Confirmed by BELFI  MD, MELANIE ([id number]) on 02/18/2016 5:51:54 PM       Radiology Dg Chest 2 View  Result Date: 02/18/2016 CLINICAL DATA:  Cough, shortness of breath. EXAM: CHEST  2 VIEW COMPARISON:  Radiographs of August 02, 2015. FINDINGS: The heart size and mediastinal contours are within normal limits. Both lungs are clear. No pneumothorax or pleural effusion is noted. The visualized skeletal structures are unremarkable. IMPRESSION: No active cardiopulmonary disease. Electronically Signed   By: Lupita Raider, M.D.   On: 02/18/2016 16:35    Procedures Procedures (including critical care time)  Medications Ordered in ED Medications  albuterol (PROVENTIL HFA;VENTOLIN HFA) 108 (90 Base) MCG/ACT inhaler 1-2 puff (not administered)  sodium chloride 0.9 % bolus 1,000 mL (0 mLs Intravenous Stopped 02/18/16 1852)      Initial Impression / Assessment and Plan / ED Course  I have reviewed the triage vital signs and the nursing notes.  Pertinent labs & imaging results that were available during my care of the patient were reviewed by me and considered in my medical decision making (see chart for details).     Patient with most likely bronchitis. CXR shows no active cardiopulmonary disease. CBC, BMP unremarkable. D-dimer negative. Tachycardia improved with 1 L fluid bolus. Discharge home with albuterol inhaler, Tessalon, prednisone taper, and Zofran for nausea. Return precautions discussed. Patient encouraged to follow up and establish care with primary care provider. Patient understands and agrees with plan. Patient discharged in satisfactory condition.  Final Clinical Impressions(s) / ED Diagnoses   Final diagnoses:  Cough    New Prescriptions New Prescriptions   BENZONATATE (TESSALON) 100 MG CAPSULE    Take 1 capsule (100 mg total) by mouth every 8 (eight) hours.   ONDANSETRON (ZOFRAN) 4 MG TABLET    Take 1  tablet (4 mg total) by mouth every 6 (six) hours.   PREDNISONE (STERAPRED UNI-PAK 21 TAB) 10 MG (21) TBPK TABLET    Take 1 tablet (10 mg total) by mouth daily. Take 6 tabs by mouth daily  for 2 days, then 5 tabs for 2 days, then 4 tabs for 2 days, then 3 tabs for 2 days, 2 tabs for 2 days, then 1 tab by mouth daily for 2 days     Emi Holes, PA-C 02/18/16 1855    Rolan Bucco, MD 02/18/16 [phone]

## 2016-02-18 NOTE — ED Triage Notes (Signed)
Cough x3 weeks. Getting worse. Burning in chest.

## 2016-02-19 DIAGNOSIS — S8392XA Sprain of unspecified site of left knee, initial encounter: Secondary | ICD-10-CM | POA: Insufficient documentation

## 2016-02-19 DIAGNOSIS — M7582 Other shoulder lesions, left shoulder: Secondary | ICD-10-CM | POA: Insufficient documentation

## 2016-04-21 ENCOUNTER — Emergency Department (HOSPITAL_BASED_OUTPATIENT_CLINIC_OR_DEPARTMENT_OTHER)
Admission: EM | Admit: 2016-04-21 | Discharge: 2016-04-21 | Disposition: A | Discharge status: Home / Self Care | Payer: Commercial Managed Care - PPO | Attending: Emergency Medicine | Admitting: Emergency Medicine

## 2016-04-21 ENCOUNTER — Encounter (HOSPITAL_BASED_OUTPATIENT_CLINIC_OR_DEPARTMENT_OTHER): Payer: Self-pay | Admitting: Emergency Medicine

## 2016-04-21 ENCOUNTER — Emergency Department (HOSPITAL_BASED_OUTPATIENT_CLINIC_OR_DEPARTMENT_OTHER): Payer: Commercial Managed Care - PPO

## 2016-04-21 DIAGNOSIS — R1013 Epigastric pain: Secondary | ICD-10-CM | POA: Insufficient documentation

## 2016-04-21 DIAGNOSIS — R1032 Left lower quadrant pain: Secondary | ICD-10-CM | POA: Diagnosis not present

## 2016-04-21 DIAGNOSIS — R1011 Right upper quadrant pain: Secondary | ICD-10-CM | POA: Diagnosis present

## 2016-04-21 LAB — CBC
HEMATOCRIT: 47.2 % (ref 39.0–52.0)
Hemoglobin: 15.6 g/dL (ref 13.0–17.0)
MCH: 28.7 pg (ref 26.0–34.0)
MCHC: 33.1 g/dL (ref 30.0–36.0)
MCV: 86.8 fL (ref 78.0–100.0)
PLATELETS: 211 10*3/uL (ref 150–400)
RBC: 5.44 MIL/uL (ref 4.22–5.81)
RDW: 14.5 % (ref 11.5–15.5)
WBC: 6 10*3/uL (ref 4.0–10.5)

## 2016-04-21 LAB — URINALYSIS, ROUTINE W REFLEX MICROSCOPIC
Bilirubin Urine: NEGATIVE
GLUCOSE, UA: NEGATIVE mg/dL
HGB URINE DIPSTICK: NEGATIVE
Ketones, ur: NEGATIVE mg/dL
Leukocytes, UA: NEGATIVE
Nitrite: NEGATIVE
PH: 5.5 (ref 5.0–8.0)
PROTEIN: NEGATIVE mg/dL
Specific Gravity, Urine: >1.046 — ABNORMAL HIGH (ref 1.005–1.030)

## 2016-04-21 LAB — COMPREHENSIVE METABOLIC PANEL
ALT: 47 U/L (ref 17–63)
AST: 37 U/L (ref 15–41)
Albumin: 4.2 g/dL (ref 3.5–5.0)
Alkaline Phosphatase: 82 U/L (ref 38–126)
Anion gap: 7 (ref 5–15)
BUN: 16 mg/dL (ref 6–20)
CHLORIDE: 103 mmol/L (ref 101–111)
CO2: 26 mmol/L (ref 22–32)
CREATININE: 1.01 mg/dL (ref 0.61–1.24)
Calcium: 8.6 mg/dL — ABNORMAL LOW (ref 8.9–10.3)
GFR calc non Af Amer: >60 mL/min (ref >60)
Glucose, Bld: 110 mg/dL — ABNORMAL HIGH (ref 65–99)
POTASSIUM: 3.4 mmol/L — AB (ref 3.5–5.1)
SODIUM: 136 mmol/L (ref 135–145)
Total Bilirubin: 1 mg/dL (ref 0.3–1.2)
Total Protein: 7.5 g/dL (ref 6.5–8.1)

## 2016-04-21 LAB — LIPASE, BLOOD: LIPASE: 19 U/L (ref 11–51)

## 2016-04-21 MED ORDER — HYDROCODONE-ACETAMINOPHEN 5-325 MG PO TABS: 2.0000 | ORAL_TABLET | ORAL | 0 refills | Status: DC | PRN

## 2016-04-21 MED ORDER — IOPAMIDOL (ISOVUE-300) INJECTION 61%
100.0000 mL | Freq: Once | INTRAVENOUS | Status: AC | PRN
  Administered 2016-04-21: 100 mL via INTRAVENOUS

## 2016-04-21 MED ORDER — SODIUM CHLORIDE 0.9 % IV BOLUS (SEPSIS)
1000.0000 mL | Freq: Once | INTRAVENOUS | Status: AC
  Administered 2016-04-21: 1000 mL via INTRAVENOUS

## 2016-04-21 NOTE — ED Notes (Signed)
Patient transported to CT 

## 2016-04-21 NOTE — Discharge Instructions (Signed)
Hydrocodone as prescribed as needed for pain.  Drink plenty of fluids.  Return to the emergency department for worsening pain, high fevers, bloody stools, or other new when concerning symptoms.

## 2016-04-21 NOTE — ED Notes (Signed)
Patient reports that he is dizzy with movement and standing up. The patient attempts to stand up to change shirt and becomes acutely dizzy. Patient with tachy cardia and reports of mid to upper quadrant pain.

## 2016-04-21 NOTE — ED Notes (Signed)
MD aware of the orthostatic vital signs.

## 2016-04-21 NOTE — ED Provider Notes (Addendum)
[facility]   CSN: [id number] Arrival date & time: 04/21/16  0023     History   Chief Complaint Chief Complaint  Patient presents with  . Mouth Lesions  . Abdominal Pain    HPI Randy Rose is a 51 y.o. male.  Patient is a 51 year old male with no significant past medical history. He presents for evaluation of abdominal pain, vomiting, and dizziness for the past 4 days. He denies any fevers. He does report some diarrhea which has been nonbloody. He describes it as watery. He denies any urinary complaints. He does report feeling dizzy when he stands.   The history is provided by the patient.  Abdominal Pain   This is a new problem. Episode onset: 4 days ago. The problem occurs constantly. The problem has been gradually worsening. The pain is located in the RUQ and LLQ. The quality of the pain is cramping. The pain is moderate. Pertinent negatives include fever, vomiting, constipation and dysuria. Nothing aggravates the symptoms. Nothing relieves the symptoms.    Past Medical History:  Diagnosis Date  . Depression     There are no active problems to display for this patient.   History reviewed. No pertinent surgical history.     Home Medications    Prior to Admission medications   Medication Sig Start Date End Date Taking? Authorizing Provider  benzonatate (TESSALON) 100 MG capsule Take 1 capsule (100 mg total) by mouth every 8 (eight) hours. 02/18/16   Emi Holes, PA-C  clindamycin (CLEOCIN) 150 MG capsule Take 2 capsules (300 mg total) by mouth 3 (three) times daily. May dispense as  capsules 01/10/16   Arthor Captain, PA-C  hydrOXYzine (ATARAX/VISTARIL) 25 MG tablet Take 25 mg by mouth 3 (three) times daily as needed.    Historical Provider, MD  ondansetron (ZOFRAN) 4 MG tablet Take 1 tablet (4 mg total) by mouth every 6 (six) hours. 02/18/16   Emi Holes, PA-C  predniSONE (STERAPRED UNI-PAK 21 TAB) 10 MG (21) TBPK tablet Take 1  tablet (10 mg total) by mouth daily. Take 6 tabs by mouth daily  for 2 days, then 5 tabs for 2 days, then 4 tabs for 2 days, then 3 tabs for 2 days, 2 tabs for 2 days, then 1 tab by mouth daily for 2 days 02/18/16   Emi Holes, PA-C  sertraline (ZOLOFT) 50 MG tablet Take 50 mg by mouth daily.    Historical Provider, MD    Family History No family history on file.  Social History Social History  Substance Use Topics  . Smoking status: Never Smoker  . Smokeless tobacco: Never Used  . Alcohol use No     Allergies   Penicillins   Review of Systems Review of Systems  Constitutional: Negative for fever.  Gastrointestinal: Positive for abdominal pain. Negative for constipation and vomiting.  Genitourinary: Negative for dysuria.  All other systems reviewed and are negative.    Physical Exam Updated Vital Signs BP 105/71 (BP Location: Left Arm)   Pulse (!) 144   Temp 99.2 F (37.3 C) (Oral)   Resp 20   Ht  (1.854 m)   Wt 276 lb (125.2 kg)   SpO2 100%   BMI 36.41 kg/m   Physical Exam  Constitutional: He is oriented to person, place, and time. He appears well-developed and well-nourished. No distress.  HENT:  Head: Normocephalic and atraumatic.  Mouth/Throat: Oropharynx is clear and moist.  Neck: Normal range  of motion. Neck supple.  Cardiovascular: Normal rate and regular rhythm.  Exam reveals no friction rub.   No murmur heard. Pulmonary/Chest: Effort normal and breath sounds normal. No respiratory distress. He has no wheezes. He has no rales.  Abdominal: Soft. Bowel sounds are normal. He exhibits no distension. There is tenderness. There is no rebound and no guarding.  There is ttp in the right upper quadrant and left lower quadrant.  Musculoskeletal: Normal range of motion. He exhibits no edema.  Neurological: He is alert and oriented to person, place, and time. Coordination normal.  Skin: Skin is warm and dry. He is not diaphoretic.  Nursing note and vitals  reviewed.    ED Treatments / Results  Labs (all labs ordered are listed, but only abnormal results are displayed) Labs Reviewed  CBC  LIPASE, BLOOD  COMPREHENSIVE METABOLIC PANEL  URINALYSIS, ROUTINE W REFLEX MICROSCOPIC    EKG  EKG Interpretation None       Radiology No results found.  Procedures Procedures (including critical care time)  Medications Ordered in ED Medications  sodium chloride 0.9 % bolus 1,000 mL (not administered)     Initial Impression / Assessment and Plan / ED Course  I have reviewed the triage vital signs and the nursing notes.  Pertinent labs & imaging results that were available during my care of the patient were reviewed by me and considered in my medical decision making (see chart for details).  Patient presents here with epigastric pain that started earlier this evening. He has no elevation of white count and laboratory studies are reassuring. His CT scan shows no acute surgical process. I see no indication for admission or further workup. He will be given pain medication and is advised to follow-up as needed. He was hydrated and appears to be feeling better.  He does seem to have one aphthuous-appearing lesion under his tongue, however no others. No specific treatment required.  Final Clinical Impressions(s) / ED Diagnoses   Final diagnoses:  None    New Prescriptions New Prescriptions   No medications on file     Geoffery Lyons, MD 04/21/16 1610    Geoffery Lyons, MD 04/21/16 [phone]

## 2016-04-21 NOTE — ED Triage Notes (Signed)
Pt reports having sores in his mouth and throat. Pt also c/o abd pain with vomiting and diarrhea today.

## 2016-06-15 ENCOUNTER — Encounter (HOSPITAL_BASED_OUTPATIENT_CLINIC_OR_DEPARTMENT_OTHER): Payer: Self-pay | Admitting: *Deleted

## 2016-06-15 ENCOUNTER — Emergency Department (HOSPITAL_BASED_OUTPATIENT_CLINIC_OR_DEPARTMENT_OTHER): Payer: Commercial Managed Care - PPO

## 2016-06-15 ENCOUNTER — Emergency Department (HOSPITAL_BASED_OUTPATIENT_CLINIC_OR_DEPARTMENT_OTHER)
Admission: EM | Admit: 2016-06-15 | Discharge: 2016-06-15 | Disposition: A | Discharge status: Home / Self Care | Payer: Commercial Managed Care - PPO | Attending: Emergency Medicine | Admitting: Emergency Medicine

## 2016-06-15 DIAGNOSIS — M6281 Muscle weakness (generalized): Secondary | ICD-10-CM | POA: Diagnosis not present

## 2016-06-15 DIAGNOSIS — R0789 Other chest pain: Secondary | ICD-10-CM | POA: Insufficient documentation

## 2016-06-15 DIAGNOSIS — R079 Chest pain, unspecified: Secondary | ICD-10-CM | POA: Diagnosis present

## 2016-06-15 LAB — CBC
HCT: 46.3 % (ref 39.0–52.0)
Hemoglobin: 15.5 g/dL (ref 13.0–17.0)
MCH: 28.9 pg (ref 26.0–34.0)
MCHC: 33.5 g/dL (ref 30.0–36.0)
MCV: 86.2 fL (ref 78.0–100.0)
PLATELETS: 230 10*3/uL (ref 150–400)
RBC: 5.37 MIL/uL (ref 4.22–5.81)
RDW: 14.1 % (ref 11.5–15.5)
WBC: 5.6 10*3/uL (ref 4.0–10.5)

## 2016-06-15 LAB — BASIC METABOLIC PANEL
Anion gap: 9 (ref 5–15)
BUN: 15 mg/dL (ref 6–20)
CALCIUM: 9 mg/dL (ref 8.9–10.3)
CO2: 26 mmol/L (ref 22–32)
CREATININE: 0.84 mg/dL (ref 0.61–1.24)
Chloride: 105 mmol/L (ref 101–111)
GFR calc Af Amer: >60 mL/min (ref >60)
GFR calc non Af Amer: >60 mL/min (ref >60)
GLUCOSE: 134 mg/dL — AB (ref 65–99)
Potassium: 3.3 mmol/L — ABNORMAL LOW (ref 3.5–5.1)
Sodium: 140 mmol/L (ref 135–145)

## 2016-06-15 LAB — TROPONIN I

## 2016-06-15 MED ORDER — KETOROLAC TROMETHAMINE 30 MG/ML IJ SOLN
30.0000 mg | Freq: Once | INTRAMUSCULAR | Status: AC
  Administered 2016-06-15: 30 mg via INTRAVENOUS
  Filled 2016-06-15: qty 1

## 2016-06-15 MED ORDER — NAPROXEN 500 MG PO TABS: 500.0000 mg | ORAL_TABLET | Freq: Two times a day (BID) | ORAL | 0 refills | Status: DC

## 2016-06-15 MED ORDER — GI COCKTAIL ~~LOC~~
30.0000 mL | Freq: Once | ORAL | Status: AC
  Administered 2016-06-15: 30 mL via ORAL
  Filled 2016-06-15: qty 30

## 2016-06-15 MED ORDER — HYDROCODONE-ACETAMINOPHEN 5-325 MG PO TABS: 2.0000 | ORAL_TABLET | ORAL | 0 refills | Status: DC | PRN

## 2016-06-15 MED FILL — HYDROCODON-APAP 5-325: 5-325 | 2 days supply | Qty: 12 | Fill #0

## 2016-06-15 MED FILL — NAPROXEN 500 MG TABLET: 500 | 10 days supply | Qty: 20 | Fill #0

## 2016-06-15 NOTE — ED Triage Notes (Addendum)
Chest pain woke him at 5am. Fluttering feeling. Left arm is numb.

## 2016-06-15 NOTE — Discharge Instructions (Signed)
Naproxen as prescribed. Hydrocodone as prescribed as needed for pain not relieved with naproxen.  Follow-up with your primary Dr. in the next 3-4 days, and return to the ER if symptoms significantly worsen or change in the meantime.

## 2016-06-15 NOTE — ED Notes (Signed)
Upon assessment pt was describing his cardiac chest pain, but pt also described heaviness in his L arm and leg with decreased sensation. EDP notified and is at bedside a this time.

## 2016-06-15 NOTE — ED Provider Notes (Signed)
[facility]   CSN: [id number] Arrival date & time: 06/15/16  1301     History   Chief Complaint Chief Complaint  Patient presents with  . Chest Pain    HPI Randy Rose is a 51 y.o. male.  Patient is a 51 year old male with no significant past medical history presenting for evaluation of sharp, electrical shock like pains in his chest that started this morning at approximately 5:30 when getting out of bed. This is been occurring intermittently since that time. He also reports intermittent fluttering in his chest and reports his left arm feeling numb. He denies any weakness. He denies any shortness of breath, diaphoresis, or cough.    Chest Pain   This is a new problem. Episode onset: 5:30 AM. Episode frequency: Intermittently. The problem has not changed since onset.The pain is associated with movement. The pain is moderate. The quality of the pain is described as brief. The pain does not radiate. Pertinent negatives include no diaphoresis, no nausea and no shortness of breath. He has tried nothing for the symptoms. The treatment provided no relief.    Past Medical History:  Diagnosis Date  . Depression     There are no active problems to display for this patient.   History reviewed. No pertinent surgical history.     Home Medications    Prior to Admission medications   Medication Sig Start Date End Date Taking? Authorizing Provider  benzonatate (TESSALON) 100 MG capsule Take 1 capsule (100 mg total) by mouth every 8 (eight) hours. 02/18/16   Emi Holes, PA-C  clindamycin (CLEOCIN) 150 MG capsule Take 2 capsules (300 mg total) by mouth 3 (three) times daily. May dispense as 150mg  capsules 01/10/16   Arthor Captain, PA-C  HYDROcodone-acetaminophen (NORCO/VICODIN) 5-325 MG tablet Take 2 tablets by mouth every 4 (four) hours as needed. 04/21/16   Geoffery Lyons, MD  hydrOXYzine (ATARAX/VISTARIL) 25 MG tablet Take 25 mg by mouth 3 (three) times  daily as needed.    [provider]  ondansetron (ZOFRAN) 4 MG tablet Take 1 tablet (4 mg total) by mouth every 6 (six) hours. 02/18/16   Law, Waylan Boga, PA-C  predniSONE (STERAPRED UNI-PAK 21 TAB) 10 MG (21) TBPK tablet Take 1 tablet (10 mg total) by mouth daily. Take 6 tabs by mouth daily  for 2 days, then 5 tabs for 2 days, then 4 tabs for 2 days, then 3 tabs for 2 days, 2 tabs for 2 days, then 1 tab by mouth daily for 2 days 02/18/16   Emi Holes, PA-C  sertraline (ZOLOFT) 50 MG tablet Take 50 mg by mouth daily.    [provider]    Family History No family history on file.  Social History Social History  Substance Use Topics  . Smoking status: Never Smoker  . Smokeless tobacco: Never Used  . Alcohol use No     Allergies   Penicillins   Review of Systems Review of Systems  Constitutional: Negative for diaphoresis.  Respiratory: Negative for shortness of breath.   Cardiovascular: Positive for chest pain.  Gastrointestinal: Negative for nausea.  All other systems reviewed and are negative.    Physical Exam Updated Vital Signs BP (!) 146/103   Pulse 93   Temp 98.4 F (36.9 C) (Oral)   Resp 20   Ht 6\' 1"  (1.854 m)   Wt 117.9 kg (260 lb)   SpO2 95%   BMI 34.30 kg/m   Physical Exam  Constitutional: He is oriented to person, place, and time. He appears well-developed and well-nourished. No distress.  HENT:  Head: Normocephalic and atraumatic.  Mouth/Throat: Oropharynx is clear and moist.  Neck: Normal range of motion. Neck supple.  Cardiovascular: Normal rate and regular rhythm.  Exam reveals no friction rub.   No murmur heard. Pulmonary/Chest: Effort normal and breath sounds normal. No respiratory distress. He has no wheezes. He has no rales.  Abdominal: Soft. Bowel sounds are normal. He exhibits no distension. There is no tenderness.  Musculoskeletal: Normal range of motion. He exhibits no edema.  Neurological: He is alert and oriented to  person, place, and time. Coordination normal.  Skin: Skin is warm and dry. He is not diaphoretic.  Nursing note and vitals reviewed.    ED Treatments / Results  Labs (all labs ordered are listed, but only abnormal results are displayed) Labs Reviewed  BASIC METABOLIC PANEL  CBC  TROPONIN I    EKG  EKG Interpretation  Date/Time:  Thursday Jun 15 2016 13:06:38 EDT Ventricular Rate:  98 PR Interval:  158 QRS Duration: 84 QT Interval:  348 QTC Calculation: 444 R Axis:   43 Text Interpretation:  Normal sinus rhythm Normal ECG Confirmed by Geoffery LyonseLo, Emmitte Surgeon ([id number]) on 06/15/2016 1:33:44 PM       Radiology No results found.  Procedures Procedures (including critical care time)  Medications Ordered in ED Medications - No data to display   Initial Impression / Assessment and Plan / ED Course  I have reviewed the triage vital signs and the nursing notes.  Pertinent labs & imaging results that were available during my care of the patient were reviewed by me and considered in my medical decision making (see chart for details).  Patient presents here with pain in his chest that is sharp and intermittent since this morning. He reports a squeezing sensation that lasts several seconds, then eases up. He feels like his heart races during these episodes. He has had multiple episodes of this while he has been in the emergency department and is having no ectopy. The remainder of his workup reveals a normal EKG, negative troponin. He was also complaining of some left arm numbness. Head CT reveals no abnormality in the appears to be neurologically intact.  I am uncertain as to what is causing his symptoms, however nothing today appears emergent. He was given Toradol and a GI cocktail and will be discharged with anti-inflammatories, rest, and follow-up as needed.  Final Clinical Impressions(s) / ED Diagnoses   Final diagnoses:  None    New Prescriptions New Prescriptions   No medications  on file     Geoffery Lyonselo, Claudia Alvizo, MD 06/15/16 [id number]

## 2016-07-23 ENCOUNTER — Encounter (HOSPITAL_BASED_OUTPATIENT_CLINIC_OR_DEPARTMENT_OTHER): Payer: Self-pay | Admitting: Emergency Medicine

## 2016-07-23 ENCOUNTER — Emergency Department (HOSPITAL_BASED_OUTPATIENT_CLINIC_OR_DEPARTMENT_OTHER): Payer: Commercial Managed Care - PPO

## 2016-07-23 ENCOUNTER — Emergency Department (HOSPITAL_BASED_OUTPATIENT_CLINIC_OR_DEPARTMENT_OTHER)
Admission: EM | Admit: 2016-07-23 | Discharge: 2016-07-23 | Disposition: A | Discharge status: Home / Self Care | Payer: Commercial Managed Care - PPO | Attending: Emergency Medicine | Admitting: Emergency Medicine

## 2016-07-23 DIAGNOSIS — Y999 Unspecified external cause status: Secondary | ICD-10-CM | POA: Diagnosis not present

## 2016-07-23 DIAGNOSIS — Y929 Unspecified place or not applicable: Secondary | ICD-10-CM | POA: Insufficient documentation

## 2016-07-23 DIAGNOSIS — I10 Essential (primary) hypertension: Secondary | ICD-10-CM | POA: Diagnosis not present

## 2016-07-23 DIAGNOSIS — S161XXA Strain of muscle, fascia and tendon at neck level, initial encounter: Secondary | ICD-10-CM | POA: Diagnosis not present

## 2016-07-23 DIAGNOSIS — S199XXA Unspecified injury of neck, initial encounter: Secondary | ICD-10-CM | POA: Diagnosis present

## 2016-07-23 DIAGNOSIS — Z79899 Other long term (current) drug therapy: Secondary | ICD-10-CM | POA: Diagnosis not present

## 2016-07-23 DIAGNOSIS — Y939 Activity, unspecified: Secondary | ICD-10-CM | POA: Insufficient documentation

## 2016-07-23 MED ORDER — METHOCARBAMOL 500 MG PO TABS: 500.0000 mg | ORAL_TABLET | Freq: Two times a day (BID) | ORAL | 0 refills | Status: DC

## 2016-07-23 MED ORDER — ACETAMINOPHEN 325 MG PO TABS
650.0000 mg | ORAL_TABLET | Freq: Once | ORAL | Status: AC
  Administered 2016-07-23: 650 mg via ORAL
  Filled 2016-07-23: qty 2

## 2016-07-23 MED ORDER — IBUPROFEN 400 MG PO TABS
400.0000 mg | ORAL_TABLET | Freq: Once | ORAL | Status: AC | PRN
  Administered 2016-07-23: 400 mg via ORAL
  Filled 2016-07-23: qty 1

## 2016-07-23 MED ORDER — NAPROXEN 500 MG PO TABS: 500.0000 mg | ORAL_TABLET | Freq: Two times a day (BID) | ORAL | 0 refills | Status: DC
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
## 2016-07-23 NOTE — Discharge Instructions (Signed)
Take Naprosyn as prescribed as needed for pain. Robaxin for muscle spasms. Ice and heat, you may alternate both. Stretch. Follow up in 3-5 days if not improving. Your blood pressure elevated in ED, recheck in 7 days

## 2016-08-06 ENCOUNTER — Encounter (HOSPITAL_BASED_OUTPATIENT_CLINIC_OR_DEPARTMENT_OTHER): Payer: Self-pay | Admitting: Emergency Medicine

## 2016-08-06 ENCOUNTER — Emergency Department (HOSPITAL_BASED_OUTPATIENT_CLINIC_OR_DEPARTMENT_OTHER)
Admission: EM | Admit: 2016-08-06 | Discharge: 2016-08-06 | Disposition: A | Discharge status: Home / Self Care | Payer: Commercial Managed Care - PPO | Attending: Emergency Medicine | Admitting: Emergency Medicine

## 2016-08-06 DIAGNOSIS — I10 Essential (primary) hypertension: Secondary | ICD-10-CM | POA: Diagnosis not present

## 2016-08-06 DIAGNOSIS — M25562 Pain in left knee: Secondary | ICD-10-CM | POA: Diagnosis present

## 2016-08-06 DIAGNOSIS — Z79899 Other long term (current) drug therapy: Secondary | ICD-10-CM | POA: Insufficient documentation

## 2016-08-06 MED ORDER — NAPROXEN 500 MG PO TABS: 500.0000 mg | ORAL_TABLET | Freq: Two times a day (BID) | ORAL | 0 refills | Status: DC
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
## 2016-08-06 NOTE — Discharge Instructions (Signed)
Medications: Naprosyn  Treatment: Take Naprosyn twice daily as prescribed. You can alternate with Tylenol as prescribed over-the-counter. Use ice 3-4 times daily alternating 20 minutes on, 20 minutes off. Wear your knee sleeve for support, especially with walking. Avoid jobs with lots of stepping up stairs.  Follow-up: Please see one of the sports medicine/orthopedic doctors for further evaluation and treatment. Please return the emergency Department develop any fever, increasing pain, warmth, redness, swelling to your knee.

## 2016-08-06 NOTE — ED Provider Notes (Signed)
[facility]   CSN: [id number] Arrival date & time: 08/06/16  1221     History   Chief Complaint Chief Complaint  Patient presents with  . Knee Pain    HPI Randy Rose is a 51 y.o. male with history of hypertension and depression who presents with a five-day history of left knee pain. Patient reports having problems with this knee in the past after an injury last year at work. 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 Patient noticed that his knee began to hurt again as he has to step up and down steps all day. Patient has the pain radiating up his left thigh as well as a clicking sensation in his knee. Patient denies any fevers. He has noticed some swelling to any. He denies any pain at rest. His pain is worse with standing or walking and worse when he first gets up. He has taken Tylenol once without significant relief.  HPI  Past Medical History:  Diagnosis Date  . Depression   . Hypertension     There are no active problems to display for this patient.   History reviewed. No pertinent surgical history.     Home Medications    Prior to Admission medications   Medication Sig Start Date End Date Taking? Authorizing Provider  benzonatate (TESSALON) 100 MG capsule Take 1 capsule (100 mg total) by mouth every 8 (eight) hours. 02/18/16   Emi Holes, PA-C  clindamycin (CLEOCIN) 150 MG capsule Take 2 capsules (300 mg total) by mouth 3 (three) times daily. May dispense as 150mg  capsules 01/10/16   Arthor Captain, PA-C  HYDROcodone-acetaminophen (NORCO/VICODIN) 5-325 MG tablet Take 2 tablets by mouth every 4 (four) hours as needed. 06/15/16   Geoffery Lyons, MD  hydrOXYzine (ATARAX/VISTARIL) 25 MG tablet Take 25 mg by mouth 3 (three) times daily as needed.    [provider]  lisinopril (PRINIVIL,ZESTRIL) 20 MG tablet Take 20 mg by mouth daily.    [provider]    methocarbamol (ROBAXIN) 500 MG tablet Take 1 tablet (500 mg total) by mouth 2 (two) times daily. 07/23/16   Kirichenko, Tatyana, PA-C  naproxen (NAPROSYN) 500 MG tablet Take 1 tablet (500 mg total) by mouth 2 (two) times daily. 08/06/16   Nicole Hafley, Waylan Boga, PA-C  ondansetron (ZOFRAN) 4 MG tablet Take 1 tablet (4 mg total) by mouth every 6 (six) hours. 02/18/16   Mariah Gerstenberger, Waylan Boga, PA-C  predniSONE (STERAPRED UNI-PAK 21 TAB) 10 MG (21) TBPK tablet Take 1 tablet (10 mg total) by mouth daily. Take 6 tabs by mouth daily  for 2 days, then 5 tabs for 2 days, then 4 tabs for 2 days, then 3 tabs for 2 days, 2 tabs for 2 days, then 1 tab by mouth daily for 2 days 02/18/16   Emi Holes, PA-C  sertraline (ZOLOFT) 50 MG tablet Take 50 mg by mouth daily.    [provider]    Family History History reviewed. No pertinent family history.  Social History Social History  Substance Use Topics  . Smoking status: Never Smoker  . Smokeless tobacco: Never Used  . Alcohol use No     Allergies   Penicillins   Review of Systems Review of Systems  Constitutional: Negative for fever.  Respiratory: Negative for shortness of breath.   Cardiovascular: Negative for chest pain.  Musculoskeletal: Positive for arthralgias.  Skin: Negative for color change,  rash and wound.     Physical Exam Updated Vital Signs BP (!) 146/89 (BP Location: Left Arm)   Pulse (!) 104   Temp 98.6 F (37 C) (Oral)   Resp 18   Ht 6\' 1"  (1.854 m)   Wt 117.9 kg (260 lb)   SpO2 95%   BMI 34.30 kg/m   Physical Exam  Constitutional: He appears well-developed and well-nourished. No distress.  HENT:  Head: Normocephalic and atraumatic.  Mouth/Throat: Oropharynx is clear and moist. No oropharyngeal exudate.  Eyes: Pupils are equal, round, and reactive to light. Conjunctivae are normal. Right eye exhibits no discharge. Left eye exhibits no discharge. No scleral icterus.  Neck: Normal range of motion. Neck supple. No  thyromegaly present.  Cardiovascular: Regular rhythm, normal heart sounds and intact distal pulses.  Exam reveals no gallop and no friction rub.   No murmur heard. Pulmonary/Chest: Effort normal and breath sounds normal. No stridor. No respiratory distress. He has no wheezes. He has no rales.  Abdominal: Soft. Bowel sounds are normal. He exhibits no distension. There is no tenderness. There is no rebound and no guarding.  Musculoskeletal: He exhibits no edema.  L knee: Mild edema in comparison to right, no warmth or erythema; anterior joint line tenderness with tenderness to the quadriceps muscles of medial thigh; patient also with mild tenderness to proximal calf posterior to the knee; no Erythema; no pain with passive range of motion; negative anterior/posterior drawer; no laxity or pain with varus and valgus stress  Lymphadenopathy:    He has no cervical adenopathy.  Neurological: He is alert. Coordination normal.  Skin: Skin is warm and dry. No rash noted. He is not diaphoretic. No pallor.  Psychiatric: He has a normal mood and affect.  Nursing note and vitals reviewed.    ED Treatments / Results  Labs (all labs ordered are listed, but only abnormal results are displayed) Labs Reviewed - No data to display  EKG  EKG Interpretation None       Radiology No results found.  Procedures Procedures (including critical care time)  Medications Ordered in ED Medications - No data to display   Initial Impression / Assessment and Plan / ED Course  I have reviewed the triage vital signs and the nursing notes.  Pertinent labs & imaging results that were available during my care of the patient were reviewed by me and considered in my medical decision making (see chart for details).     Patient with acute on chronic left knee pain. No warmth or erythema, no pain with passive range of motion. Doubt septic joint. I feel patient symptoms are returning due to his return to work, going up  and down steps all day. Patient symptoms may be related to patellofemoral syndrome. No new trauma or injury indicating repeat imaging at this time. Advised resting his knee, ice, NSAIDs treatment, knee sleeve, and follow-up to orthopedics. Return precautions discussed. Patient understands and agrees with plan. Patient vitals stable during ED course and discharged in satisfactory condition.  Final Clinical Impressions(s) / ED Diagnoses   Final diagnoses:  Acute pain of left knee    New Prescriptions Discharge Medication List as of 08/06/2016  1:45 PM       Emi HolesLaw, Palak Tercero M, PA-C 08/06/16 1722    Little, Ambrose Finlandachel Morgan, MD 08/08/16 0700

## 2016-09-18 ENCOUNTER — Emergency Department (HOSPITAL_BASED_OUTPATIENT_CLINIC_OR_DEPARTMENT_OTHER)
Admission: EM | Admit: 2016-09-18 | Discharge: 2016-09-18 | Disposition: A | Discharge status: Home / Self Care | Payer: Commercial Managed Care - PPO | Attending: Emergency Medicine | Admitting: Emergency Medicine

## 2016-09-18 ENCOUNTER — Encounter (HOSPITAL_BASED_OUTPATIENT_CLINIC_OR_DEPARTMENT_OTHER): Payer: Self-pay

## 2016-09-18 DIAGNOSIS — Z79899 Other long term (current) drug therapy: Secondary | ICD-10-CM | POA: Insufficient documentation

## 2016-09-18 DIAGNOSIS — M791 Myalgia: Secondary | ICD-10-CM | POA: Diagnosis present

## 2016-09-18 DIAGNOSIS — I10 Essential (primary) hypertension: Secondary | ICD-10-CM | POA: Insufficient documentation

## 2016-09-18 NOTE — ED Notes (Signed)
ED Provider at bedside. 
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
## 2016-10-18 ENCOUNTER — Emergency Department (HOSPITAL_BASED_OUTPATIENT_CLINIC_OR_DEPARTMENT_OTHER)
Admission: EM | Admit: 2016-10-18 | Discharge: 2016-10-18 | Disposition: A | Discharge status: Home / Self Care | Payer: Commercial Managed Care - PPO | Attending: Emergency Medicine | Admitting: Emergency Medicine

## 2016-10-18 ENCOUNTER — Encounter (HOSPITAL_BASED_OUTPATIENT_CLINIC_OR_DEPARTMENT_OTHER): Payer: Self-pay | Admitting: Emergency Medicine

## 2016-10-18 ENCOUNTER — Emergency Department (HOSPITAL_BASED_OUTPATIENT_CLINIC_OR_DEPARTMENT_OTHER): Payer: Commercial Managed Care - PPO

## 2016-10-18 DIAGNOSIS — I10 Essential (primary) hypertension: Secondary | ICD-10-CM | POA: Diagnosis not present

## 2016-10-18 DIAGNOSIS — G44209 Tension-type headache, unspecified, not intractable: Secondary | ICD-10-CM

## 2016-10-18 DIAGNOSIS — R079 Chest pain, unspecified: Secondary | ICD-10-CM | POA: Diagnosis present

## 2016-10-18 DIAGNOSIS — M5442 Lumbago with sciatica, left side: Secondary | ICD-10-CM | POA: Diagnosis not present

## 2016-10-18 DIAGNOSIS — R072 Precordial pain: Secondary | ICD-10-CM

## 2016-10-18 LAB — BASIC METABOLIC PANEL
ANION GAP: 7 (ref 5–15)
BUN: 15 mg/dL (ref 6–20)
CHLORIDE: 105 mmol/L (ref 101–111)
CO2: 28 mmol/L (ref 22–32)
Calcium: 9.6 mg/dL (ref 8.9–10.3)
Creatinine, Ser: 0.97 mg/dL (ref 0.61–1.24)
GFR calc Af Amer: >60 mL/min (ref >60)
GLUCOSE: 100 mg/dL — AB (ref 65–99)
POTASSIUM: 3.6 mmol/L (ref 3.5–5.1)
Sodium: 140 mmol/L (ref 135–145)

## 2016-10-18 LAB — CBC
HEMATOCRIT: 46.5 % (ref 39.0–52.0)
HEMOGLOBIN: 15.6 g/dL (ref 13.0–17.0)
MCH: 28.8 pg (ref 26.0–34.0)
MCHC: 33.5 g/dL (ref 30.0–36.0)
MCV: 85.8 fL (ref 78.0–100.0)
Platelets: 221 10*3/uL (ref 150–400)
RBC: 5.42 MIL/uL (ref 4.22–5.81)
RDW: 14.7 % (ref 11.5–15.5)
WBC: 7 10*3/uL (ref 4.0–10.5)

## 2016-10-18 LAB — TROPONIN I: Troponin I: <0.03 ng/mL (ref <0.03)

## 2016-10-18 MED ORDER — SODIUM CHLORIDE 0.9 % IV BOLUS (SEPSIS)
500.0000 mL | Freq: Once | INTRAVENOUS | Status: AC
  Administered 2016-10-18: 500 mL via INTRAVENOUS

## 2016-10-18 MED ORDER — METOCLOPRAMIDE HCL 5 MG/ML IJ SOLN
10.0000 mg | Freq: Once | INTRAMUSCULAR | Status: AC
  Administered 2016-10-18: 10 mg via INTRAVENOUS
  Filled 2016-10-18: qty 2

## 2016-10-18 MED ORDER — KETOROLAC TROMETHAMINE 30 MG/ML IJ SOLN
30.0000 mg | Freq: Once | INTRAMUSCULAR | Status: AC
  Administered 2016-10-18: 30 mg via INTRAVENOUS
  Filled 2016-10-18: qty 1

## 2016-10-18 MED ORDER — PREDNISONE 10 MG PO TABS: 20.0000 mg | ORAL_TABLET | Freq: Every day | ORAL | 0 refills | Status: AC

## 2016-10-18 MED ORDER — IBUPROFEN 800 MG PO TABS: 800.0000 mg | ORAL_TABLET | Freq: Three times a day (TID) | ORAL | 0 refills | Status: DC | PRN

## 2016-10-18 MED FILL — predniSONE 10 MG TABS: 10 | 4 days supply | Qty: 8 | Fill #0

## 2016-10-18 MED FILL — IBUPROFEN 800 MG TABS: 800 | 7 days supply | Qty: 21 | Fill #0

## 2016-10-18 NOTE — ED Triage Notes (Signed)
Chest pain since yesterday, describes as pressure. Also endorses back pain and L leg numbness. States he is nauseated.

## 2016-10-18 NOTE — ED Provider Notes (Signed)
Emergency Department Provider Note   I have reviewed the triage vital signs and the nursing notes.   HISTORY  Chief Complaint Chest Pain   HPI Randy Rose is a 51 y.o. male with PMH of depression and HTN presents to the emergency department for evaluation of constant left chest pressure which started yesterday. Patient describes it as a burning sensation and similar to GERD symptoms that he has had in the past. Does state that he is feeling slightly short of breath and having muscle aches. He felt like he may be getting an infection. Around the same time he developed some left buttock pain and tingling sensation down the left leg. No history of sciatica. Also complaining of some mild to moderate headache started around the same time. No fevers, chills, neck stiffness. No history of similar headaches. No vision changes.   Past Medical History:  Diagnosis Date  . Depression   . Hypertension     There are no active problems to display for this patient.   History reviewed. No pertinent surgical history.  Current Outpatient Rx  . Order #: [id number] Class: Historical Med  . Order #: [id number] Class: Print  . Order #: [id number] Class: Historical Med  . Order #: [id number] Class: Print  . Order #: [id number] Class: Historical Med    Allergies Penicillins  No family history on file.  Social History Social History  Substance Use Topics  . Smoking status: Never Smoker  . Smokeless tobacco: Never Used  . Alcohol use No    Review of Systems  Constitutional: No fever/chills Eyes: No visual changes. ENT: No sore throat. Cardiovascular: Positive chest pain. Respiratory: Denies shortness of breath. Gastrointestinal: No abdominal pain.  No nausea, no vomiting.  No diarrhea.  No constipation. Genitourinary: Negative for dysuria. Musculoskeletal: Positive for back pain and left leg tingling.  Skin: Negative for rash. Neurological: Negative for headaches, focal weakness or  numbness.  10-point ROS otherwise negative.  ____________________________________________   PHYSICAL EXAM:  VITAL SIGNS: ED Triage Vitals  Enc Vitals Group     BP 10/18/16 1516 (!) 157/97     Pulse Rate 10/18/16 1516 (!) 110     Resp 10/18/16 1516 18     Temp 10/18/16 1516 98.3 F (36.8 C)     Temp Source 10/18/16 1516 Oral     SpO2 10/18/16 1516 100 %     Pain Score 10/18/16 1514 9   Constitutional: Alert and oriented. Well appearing and in no acute distress. Eyes: Conjunctivae are normal.  Head: Atraumatic. Nose: No congestion/rhinnorhea. Mouth/Throat: Mucous membranes are moist.  Neck: No stridor.   Cardiovascular: Tachycardia. Good peripheral circulation. Grossly normal heart sounds.   Respiratory: Normal respiratory effort.  No retractions. Lungs CTAB. Gastrointestinal: Soft and nontender. No distention.  Musculoskeletal: No lower extremity tenderness nor edema. No gross deformities of extremities. Normal strength.  Neurologic:  Normal speech and language. No gross focal neurologic deficits are appreciated. Normal gait. Tingling reported in the LLE.  Skin:  Skin is warm, dry and intact. No rash noted.   ____________________________________________   LABS (all labs ordered are listed, but only abnormal results are displayed)  Labs Reviewed  BASIC METABOLIC PANEL - Abnormal; Notable for the following:       Result Value   Glucose, Bld 100 (*)    All other components within normal limits  CBC  TROPONIN I   ____________________________________________  EKG   EKG Interpretation  Date/Time:  Wednesday October 18 2016 15:16:29 EDT Ventricular Rate:  107  PR Interval:  142 QRS Duration: 78 QT Interval:  354 QTC Calculation: 472 R Axis:   70 Text Interpretation:  Sinus tachycardia Otherwise normal ECG No STEMI.  Confirmed by Alona Bene [phone]) on 10/18/2016 3:35:53 PM Also confirmed by Alona Bene [phone]), editor Barbette Hair [phone])  on 10/18/2016 3:36:55  PM       ____________________________________________  RADIOLOGY  Dg Chest 2 View  Result Date: 10/18/2016 CLINICAL DATA:  Chest pain EXAM: CHEST  2 VIEW COMPARISON:  Chest radiographs February 20, 2016 and Jun 15, 2016; chest CT August 02, 2015 FINDINGS: There is no edema or consolidation. The heart size and pulmonary vascularity are normal. No adenopathy. There is mild degenerative change in the thoracic spine. There is mild upper thoracic levoscoliosis. No pneumothorax. IMPRESSION: No edema or consolidation. Electronically Signed   By: Bretta Bang III M.D.   On: 10/18/2016 15:34    ____________________________________________   PROCEDURES  Procedure(s) performed:   Procedures  None ____________________________________________   INITIAL IMPRESSION / ASSESSMENT AND PLAN / ED COURSE  Pertinent labs & imaging results that were available during my care of the patient were reviewed by me and considered in my medical decision making (see chart for details).  Patient resents to the emergency department for evaluation of chest discomfort with headache and left leg tingling symptoms. Patient's chest pain is atypical and has been constant for the last 24 hours. His EKG and troponins are normal. X-ray unremarkable. Left leg symptoms seem most consistent with sciatica. Headache is mild to moderate gradually worsening over the past day. No sudden onset, maximal intensity headaches. Plan for steroid burst and NSAID for sciatica symptoms. Will give contact info for PCP and Cardiology for outpatient stress testing. Equal pulses bilaterally. Extremely low suspicion for aortic dissection.   At this time, I do not feel there is any life-threatening condition present. I have reviewed and discussed all results (EKG, imaging, lab, urine as appropriate), exam findings with patient. I have reviewed nursing notes and appropriate previous records.  I feel the patient is safe to be discharged home  without further emergent workup. Discussed usual and customary return precautions. Patient and family (if present) verbalize understanding and are comfortable with this plan.  Patient will follow-up with their primary care provider. If they do not have a primary care provider, information for follow-up has been provided to them. All questions have been answered.  ____________________________________________  FINAL CLINICAL IMPRESSION(S) / ED DIAGNOSES  Final diagnoses:  Precordial chest pain  Acute left-sided low back pain with left-sided sciatica  Acute non intractable tension-type headache     MEDICATIONS GIVEN DURING THIS VISIT:  Medications  sodium chloride 0.9 % bolus 500 mL (0 mLs Intravenous Stopped 10/18/16 1636)  ketorolac (TORADOL) 30 MG/ML injection 30 mg (30 mg Intravenous Given 10/18/16 1612)  metoCLOPramide (REGLAN) injection 10 mg (10 mg Intravenous Given 10/18/16 1612)     NEW OUTPATIENT MEDICATIONS STARTED DURING THIS VISIT:  Discharge Medication List as of 10/18/2016  4:36 PM    START taking these medications   Details  ibuprofen (ADVIL,MOTRIN) 800 MG tablet Take 1 tablet (800 mg total) by mouth every 8 (eight) hours as needed., Starting Wed 10/18/2016, Print    predniSONE (DELTASONE) 10 MG tablet Take 2 tablets (20 mg total) by mouth daily., Starting Wed 10/18/2016, Until Sun 10/22/2016, Print        Note:  This document was prepared using Dragon voice recognition software and may include unintentional dictation errors.  Ivin Booty  Long, MD Emergency Medicine    Long, Arlyss Repress, MD 10/19/16 [phone]

## 2016-10-18 NOTE — Discharge Instructions (Signed)

## 2016-10-18 NOTE — ED Notes (Signed)
ED Provider at bedside. 

## 2017-02-01 DIAGNOSIS — S83242A Other tear of medial meniscus, current injury, left knee, initial encounter: Secondary | ICD-10-CM | POA: Insufficient documentation

## 2017-09-29 ENCOUNTER — Emergency Department (HOSPITAL_BASED_OUTPATIENT_CLINIC_OR_DEPARTMENT_OTHER)
Admission: EM | Admit: 2017-09-29 | Discharge: 2017-09-29 | Disposition: A | Discharge status: Home / Self Care | Payer: Self-pay | Attending: Emergency Medicine | Admitting: Emergency Medicine

## 2017-09-29 ENCOUNTER — Emergency Department (HOSPITAL_BASED_OUTPATIENT_CLINIC_OR_DEPARTMENT_OTHER): Payer: Self-pay

## 2017-09-29 ENCOUNTER — Other Ambulatory Visit: Payer: Self-pay

## 2017-09-29 ENCOUNTER — Encounter (HOSPITAL_BASED_OUTPATIENT_CLINIC_OR_DEPARTMENT_OTHER): Payer: Self-pay | Admitting: Emergency Medicine

## 2017-09-29 DIAGNOSIS — R0789 Other chest pain: Secondary | ICD-10-CM

## 2017-09-29 DIAGNOSIS — I10 Essential (primary) hypertension: Secondary | ICD-10-CM | POA: Insufficient documentation

## 2017-09-29 DIAGNOSIS — R51 Headache: Secondary | ICD-10-CM | POA: Insufficient documentation

## 2017-09-29 DIAGNOSIS — R002 Palpitations: Secondary | ICD-10-CM

## 2017-09-29 DIAGNOSIS — R42 Dizziness and giddiness: Secondary | ICD-10-CM | POA: Insufficient documentation

## 2017-09-29 DIAGNOSIS — Z79899 Other long term (current) drug therapy: Secondary | ICD-10-CM | POA: Insufficient documentation

## 2017-09-29 DIAGNOSIS — R519 Headache, unspecified: Secondary | ICD-10-CM

## 2017-09-29 LAB — BASIC METABOLIC PANEL
ANION GAP: 10 (ref 5–15)
BUN: 9 mg/dL (ref 6–20)
CO2: 29 mmol/L (ref 22–32)
Calcium: 9.8 mg/dL (ref 8.9–10.3)
Chloride: 103 mmol/L (ref 98–111)
Creatinine, Ser: 0.97 mg/dL (ref 0.61–1.24)
GFR calc Af Amer: >60 mL/min (ref >60)
GLUCOSE: 124 mg/dL — AB (ref 70–99)
POTASSIUM: 4 mmol/L (ref 3.5–5.1)
Sodium: 142 mmol/L (ref 135–145)

## 2017-09-29 LAB — CBC
HEMATOCRIT: 48.7 % (ref 39.0–52.0)
HEMOGLOBIN: 16.3 g/dL (ref 13.0–17.0)
MCH: 28.6 pg (ref 26.0–34.0)
MCHC: 33.5 g/dL (ref 30.0–36.0)
MCV: 85.6 fL (ref 78.0–100.0)
Platelets: 247 10*3/uL (ref 150–400)
RBC: 5.69 MIL/uL (ref 4.22–5.81)
RDW: 14.2 % (ref 11.5–15.5)
WBC: 5.3 10*3/uL (ref 4.0–10.5)

## 2017-09-29 LAB — HEPATIC FUNCTION PANEL
ALK PHOS: 77 U/L (ref 38–126)
ALT: 34 U/L (ref 0–44)
AST: 30 U/L (ref 15–41)
Albumin: 4.4 g/dL (ref 3.5–5.0)
Bilirubin, Direct: <0.1 mg/dL (ref 0.0–0.2)
TOTAL PROTEIN: 8.2 g/dL — AB (ref 6.5–8.1)
Total Bilirubin: 0.8 mg/dL (ref 0.3–1.2)

## 2017-09-29 LAB — LIPASE, BLOOD: Lipase: 29 U/L (ref 11–51)

## 2017-09-29 LAB — TROPONIN I: Troponin I: <0.03 ng/mL (ref <0.03)

## 2017-09-29 MED ORDER — LISINOPRIL 20 MG PO TABS: 20.0000 mg | ORAL_TABLET | Freq: Every day | ORAL | 2 refills | Status: DC

## 2017-09-29 NOTE — ED Notes (Signed)
Pt understood dc material. NAD noted. Script given at dc  

## 2017-09-29 NOTE — ED Notes (Signed)
ED Provider at bedside. 

## 2017-09-29 NOTE — ED Notes (Signed)
Spoke with Okey RegalCarol in the lab and added on Hepatic and Lipase lab

## 2017-09-29 NOTE — Discharge Instructions (Addendum)
Make an appointment to follow-up with the wellness clinic.  For follow-up of your blood pressure.  Today's work-up for the other symptoms without any specific findings.  However return for any new or worse symptoms.  Also follow-up with wellness clinic for them.  Based on your blood pressures here are very concerned that you may actually have undiagnosed hypertension.  Follow-up blood pressures will confirm one way or the other.  Also did note going to your records that you are on lisinopril in the past.  We will go ahead and repeat scribe that.  As we stated most likely you are showing signs of hypertension.  Still recommend follow-up with the wellness clinic as soon as possible.

## 2017-09-29 NOTE — ED Provider Notes (Signed)
[facility] EMERGENCY DEPARTMENT Provider Note   CSN: [id number] Arrival date & time: 09/29/17  0859     History   Chief Complaint Chief Complaint  Patient presents with  . Palpitations  . Dizziness    HPI Randy Rose is a 52 y.o. male.  Patient presenting with multiple complaints.  But everything started at 1300 yesterday.  That time he got kind of a left-sided headache around his left eye.  Also had dizziness and lightheadedness but no vertigo.  Also had some chest discomfort that was sharp brief pains have continued throughout until today.  And also got some pain left arm and left side of his chest and abdomen.  No nausea no vomiting.  Also associated with the feelings of some palpitations where his heart was racing.  Has not had any of that since he has been here.  We had some of that on the way here.  No radiation of pain to the back.  Not associated with shortness of breath.  No syncope or near syncope.  No history of similar problems.  Blood pressure in triage noted to be elevated but patient does have a history of hypertension in the past however he denies having hypertension.  Patient not on any antihypertensive patient medicines at the moment.  How has however has been on lisinopril in the past.     Past Medical History:  Diagnosis Date  . Depression   . Hypertension     There are no active problems to display for this patient.   No past surgical history on file.      Home Medications    Prior to Admission medications   Medication Sig Start Date End Date Taking? Authorizing Provider  hydrOXYzine (ATARAX/VISTARIL) 25 MG tablet Take 25 mg by mouth 3 (three) times daily as needed.    [provider]  ibuprofen (ADVIL,MOTRIN) 800 MG tablet Take 1 tablet (800 mg total) by mouth every 8 (eight) hours as needed. 10/18/16   Long, Arlyss Repress, MD  lisinopril (PRINIVIL,ZESTRIL) 20 MG tablet Take 20 mg by mouth daily.    [provider]    sertraline (ZOLOFT) 50 MG tablet Take 50 mg by mouth daily.    [provider]    Family History No family history on file.  Social History Social History   Tobacco Use  . Smoking status: Never Smoker  . Smokeless tobacco: Never Used  Substance Use Topics  . Alcohol use: No  . Drug use: No     Allergies   Penicillins   Review of Systems Review of Systems   Physical Exam Updated Vital Signs BP (!) 157/101   Pulse 96   Temp 98.6 F (37 C) (Oral)   Resp 20   Ht 1.854 m (6\' 1" )   Wt 117.9 kg   SpO2 98%   BMI 34.30 kg/m   Physical Exam  Constitutional: He is oriented to person, place, and time. He appears well-developed and well-nourished. No distress.  HENT:  Head: Normocephalic and atraumatic.  Mouth/Throat: Oropharynx is clear and moist.  Eyes: Pupils are equal, round, and reactive to light. Conjunctivae and EOM are normal.  Neck: Neck supple.  Cardiovascular: Normal rate, regular rhythm and normal heart sounds.  Pulmonary/Chest: Effort normal and breath sounds normal.  Abdominal: Soft. Bowel sounds are normal. There is no tenderness.  Musculoskeletal: Normal range of motion. He exhibits no edema.  Neurological: He is alert and oriented to person, place, and time. No cranial  nerve deficit or sensory deficit. He exhibits normal muscle tone. Coordination normal.  Skin: Skin is warm.  Nursing note and vitals reviewed.    ED Treatments / Results  Labs (all labs ordered are listed, but only abnormal results are displayed) Labs Reviewed  BASIC METABOLIC PANEL - Abnormal; Notable for the following components:      Result Value   Glucose, Bld 124 (*)    All other components within normal limits  HEPATIC FUNCTION PANEL - Abnormal; Notable for the following components:   Total Protein 8.2 (*)    All other components within normal limits  CBC  TROPONIN I  LIPASE, BLOOD    EKG EKG Interpretation  Date/Time:  Saturday September 29 2017 09:09:15  EDT Ventricular Rate:  86 PR Interval:    QRS Duration: 96 QT Interval:  368 QTC Calculation: 441 R Axis:   32 Text Interpretation:  Sinus rhythm RSR' in V1 or V2, right VCD or RVH Borderline ST elevation, anterior leads Interpretation limited secondary to artifact No significant change since last tracing Confirmed by Vanetta Mulders [phone]) on 09/29/2017 9:19:20 AM   Radiology Dg Chest 2 View  Result Date: 09/29/2017 CLINICAL DATA:  Tachycardia for 2 days EXAM: CHEST - 2 VIEW COMPARISON:  10/18/2016 FINDINGS: The heart size and mediastinal contours are within normal limits. Both lungs are clear. The visualized skeletal structures show mild degenerative change of the thoracic spine. IMPRESSION: No acute abnormality noted. Electronically Signed   By: Alcide Clever M.D.   On: 09/29/2017 09:32   Ct Head Wo Contrast  Result Date: 09/29/2017 CLINICAL DATA:  Dizziness and headaches for 2 days EXAM: CT HEAD WITHOUT CONTRAST TECHNIQUE: Contiguous axial images were obtained from the base of the skull through the vertex without intravenous contrast. COMPARISON:  06/15/2016 FINDINGS: Brain: No evidence of acute infarction, hemorrhage, hydrocephalus, extra-axial collection or mass lesion/mass effect. Vascular: No hyperdense vessel or unexpected calcification. Skull: Normal. Negative for fracture or focal lesion. Sinuses/Orbits: No acute finding. Other: None. IMPRESSION: Normal head CT Electronically Signed   By: Alcide Clever M.D.   On: 09/29/2017 09:56    Procedures Procedures (including critical care time)  Medications Ordered in ED Medications - No data to display   Initial Impression / Assessment and Plan / ED Course  I have reviewed the triage vital signs and the nursing notes.  Pertinent labs & imaging results that were available during my care of the patient were reviewed by me and considered in my medical decision making (see chart for details).    Patient with past medical history of  hypertension.  Although he did not relay this.  It appears the patient was on lisinopril at one time in the past.  We will go ahead and renew that.  His blood pressure is elevated here most likely he does have persistent hypertension.  Other complaints including the dizziness without vertigo sharp brief chest pain since 1:00 yesterday afternoon the headache to the left side.  Without any significant findings.  EKG showed some sinus tachycardia heart rhythm here was below 100.  No arrhythmias other than occasional PVC.  Electrolytes without any significant abnormalities.  Chest x-ray negative head CT negative  Do recommend follow-up with primary care provider which she currently does not have so given referral to wellness clinic.  Do recommend he start his lisinopril.  In addition patient will return for any persistent palpitations lasting 40 minutes or longer.  Will also return for any new or worse symptoms.  Do not have any evidence of a focal neuro deficit to raise concerns for stroke.  Although patient talked about dizziness.  No significant ataxia.  Also no evidence of an acute cardiac event.  Doubt that patient is hyperthyroid.  Since heart rate here is been in the 70s.  Final Clinical Impressions(s) / ED Diagnoses   Final diagnoses:  Dizziness  Palpitations  Acute intractable headache, unspecified headache type  Atypical chest pain  Essential hypertension    ED Discharge Orders    None       Vanetta MuldersZackowski, Glendi Mohiuddin, MD 09/29/17 1102

## 2017-09-29 NOTE — ED Triage Notes (Signed)
Pt states that yesterday he felt his heart racing when he would get up to do anything, also stated that he had dizziness and left sided pain that started yesterday. Pt stated he drank plenty of water and fruit thinking it was his BP, those interventions did not help.

## 2018-04-20 DIAGNOSIS — R1013 Epigastric pain: Secondary | ICD-10-CM | POA: Insufficient documentation

## 2018-10-07 IMAGING — CR DG CHEST 2V
2 series · 2 of 2 positions shown · non-contrast
Comparison: PA and lateral chest x-ray February 20, 2016

CLINICAL DATA: Left-sided chest and shoulder pain extending into
the left arm which began early this morning. Nonsmoker.

EXAM:
CHEST  2 VIEW

[w chest pa]
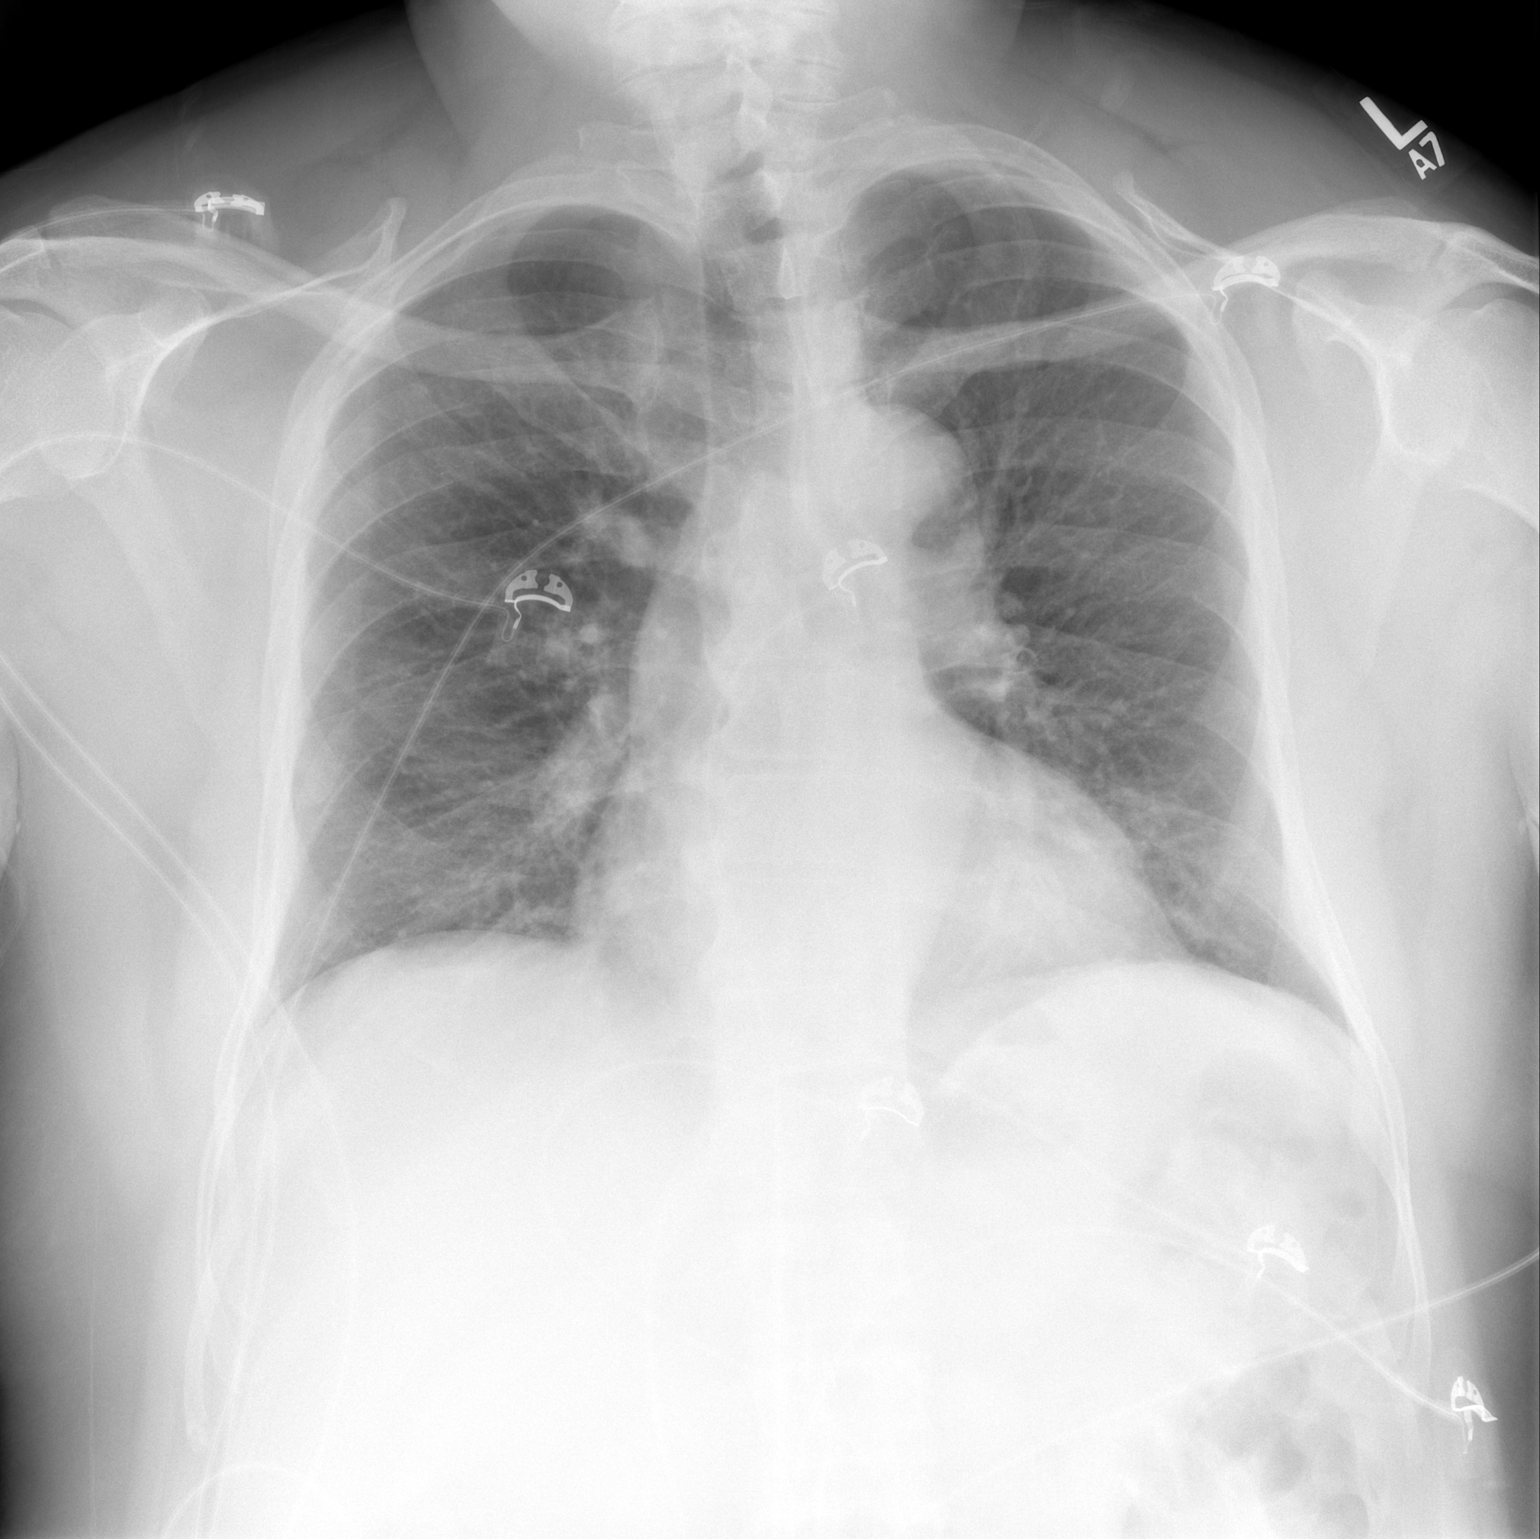

[w chest lat]
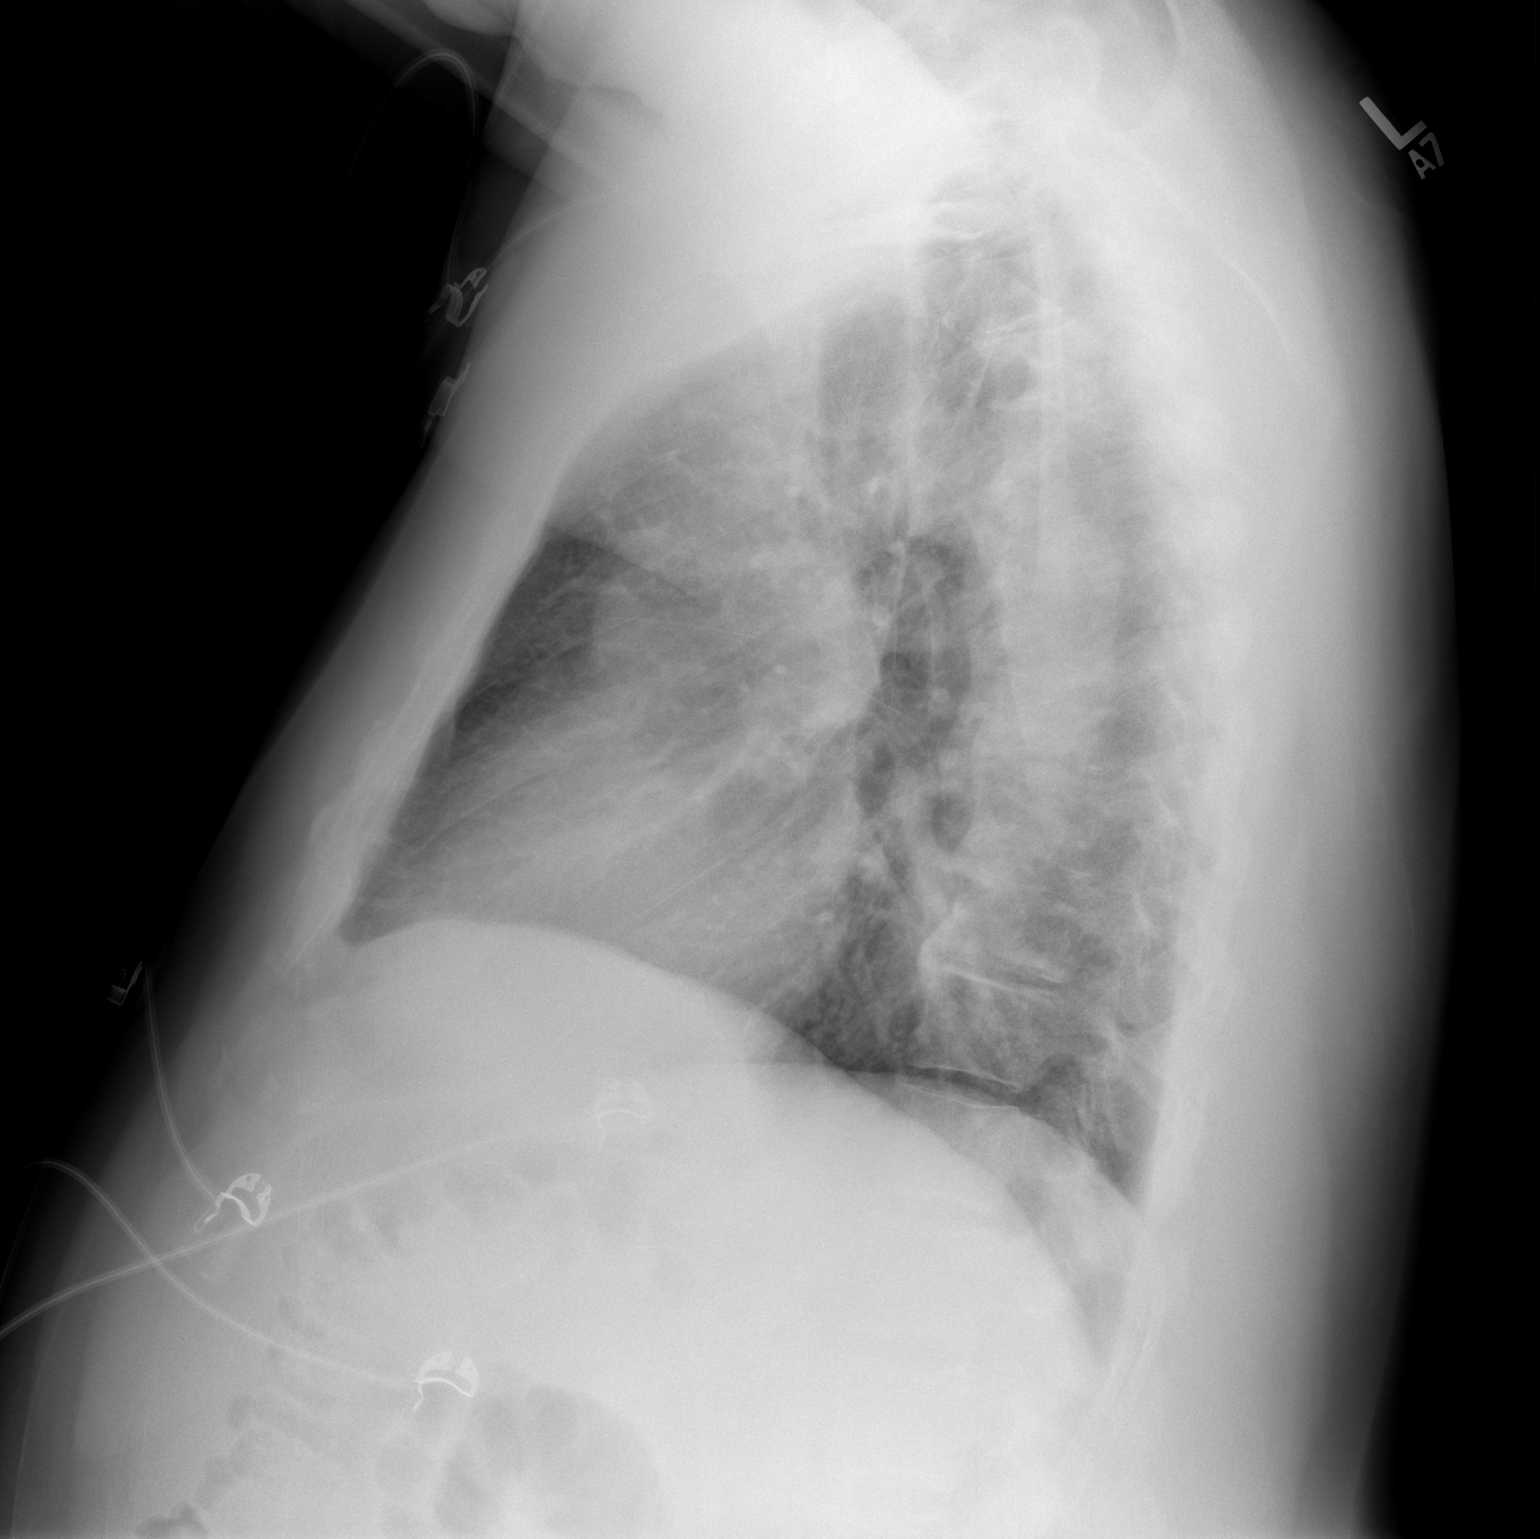

[2 of 2 positions shown; findings below may reference images not displayed]

FINDINGS: The lungs are adequately inflated. The interstitial markings are
coarse though stable. The heart top-normal in size. The pulmonary
vascularity is not clearly engorged. There is mild tortuosity of the
descending thoracic aorta. The bony thorax exhibits no acute
abnormality.
IMPRESSION: Mild interstitial prominence may reflect chronic bronchitic change.
There is no acute pneumonia nor overt evidence of CHF. If the
patient's symptoms persist and remain unexplained, chest CT scanning
may be a useful next imaging step.

## 2018-10-28 ENCOUNTER — Other Ambulatory Visit: Payer: Self-pay

## 2018-10-28 ENCOUNTER — Emergency Department (HOSPITAL_BASED_OUTPATIENT_CLINIC_OR_DEPARTMENT_OTHER): Payer: Self-pay

## 2018-10-28 ENCOUNTER — Emergency Department (HOSPITAL_BASED_OUTPATIENT_CLINIC_OR_DEPARTMENT_OTHER)
Admission: EM | Admit: 2018-10-28 | Discharge: 2018-10-28 | Disposition: A | Discharge status: Home / Self Care | Payer: Self-pay | Attending: Emergency Medicine | Admitting: Emergency Medicine

## 2018-10-28 ENCOUNTER — Encounter (HOSPITAL_BASED_OUTPATIENT_CLINIC_OR_DEPARTMENT_OTHER): Payer: Self-pay

## 2018-10-28 DIAGNOSIS — I10 Essential (primary) hypertension: Secondary | ICD-10-CM | POA: Insufficient documentation

## 2018-10-28 DIAGNOSIS — J069 Acute upper respiratory infection, unspecified: Secondary | ICD-10-CM | POA: Insufficient documentation

## 2018-10-28 DIAGNOSIS — Z20828 Contact with and (suspected) exposure to other viral communicable diseases: Secondary | ICD-10-CM | POA: Insufficient documentation

## 2018-10-28 DIAGNOSIS — M546 Pain in thoracic spine: Secondary | ICD-10-CM | POA: Insufficient documentation

## 2018-10-28 DIAGNOSIS — Z79899 Other long term (current) drug therapy: Secondary | ICD-10-CM | POA: Insufficient documentation

## 2018-10-28 LAB — SARS CORONAVIRUS 2 (TAT 6-24 HRS): SARS Coronavirus 2: NEGATIVE

## 2018-10-28 MED ORDER — LIDOCAINE 5 % EX PTCH
1.0000 | MEDICATED_PATCH | CUTANEOUS | Status: DC
  Filled 2018-10-28: qty 1

## 2018-10-28 MED ORDER — KETOROLAC TROMETHAMINE 30 MG/ML IJ SOLN
30.0000 mg | Freq: Once | INTRAMUSCULAR | Status: AC
  Administered 2018-10-28: 30 mg via INTRAMUSCULAR
  Filled 2018-10-28: qty 1

## 2018-10-28 MED ORDER — CYCLOBENZAPRINE HCL 10 MG PO TABS: 10.0000 mg | ORAL_TABLET | Freq: Two times a day (BID) | ORAL | 0 refills | Status: DC | PRN

## 2018-10-28 MED FILL — CYCLOBENZAPRINE HCL 10 MG T: 10 | 10 days supply | Qty: 20 | Fill #0

## 2018-10-28 NOTE — ED Provider Notes (Signed)
[facility] EMERGENCY DEPARTMENT Provider Note   CSN: [id number] Arrival date & time: 10/28/18  1322     History   Chief Complaint Chief Complaint  Patient presents with  . Back Pain    HPI Randy Rose is a 53 y.o. male with history of hypertension and depression presenting for multiple complaints: Eye itching and burning, body aches, exertional shortness of breath and fatigue, back pain.   States Thursday evening after work his eyes felt "tired" and itchy.  Patient states that he felt progressively more fatigued and then began feeling general body aches on Saturday.  Patient states that he notices feeling short of breath after walking up a long flight of stairs.  Patient states that he is not short of breath at rest.  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  States that he has noticed food tasting more bland recently.   Patient states headache feels similar to prior headache and has come on gradually with his general fatigue and body aches over the past few days.  Patient states headache is mild and bandlike.  Has taken some allergy medication over-the-counter without improvement d/t concern for sinus headache.   Endorses midthoracic right-sided back pain that feels like a bee sting when he bends over or moves.  Patient states that is worse with coughing and better with laying down.  Patient denies vision changes, focal weakness, numbness, tingling, bowel or bladder incontinence, fever, saddle anesthesia. Denies any history of IVDA.  Denies heavy alcohol use or any history of smoking ever.  Patient denies HLD, DM, or close family history of heart attacks.     HPI  Past Medical History:  Diagnosis Date  . Depression   . Hypertension     There are no active problems to display for this patient.   History reviewed. No pertinent surgical history.      Home Medications    Prior to Admission  medications   Medication Sig Start Date End Date Taking? Authorizing Provider  cyclobenzaprine (FLEXERIL) 10 MG tablet Take 1 tablet (10 mg total) by mouth 2 (two) times daily as needed for muscle spasms. 10/28/18   Gailen Shelter, PA  hydrOXYzine (ATARAX/VISTARIL) 25 MG tablet Take 25 mg by mouth 3 (three) times daily as needed.    [provider]  ibuprofen (ADVIL,MOTRIN) 800 MG tablet Take 1 tablet (800 mg total) by mouth every 8 (eight) hours as needed. 10/18/16   Long, Arlyss Repress, MD  lisinopril (PRINIVIL,ZESTRIL) 20 MG tablet Take 20 mg by mouth daily.    [provider]  lisinopril (PRINIVIL,ZESTRIL) 20 MG tablet Take 1 tablet (20 mg total) by mouth daily. 09/29/17   Vanetta Mulders, MD  sertraline (ZOLOFT) 50 MG tablet Take 50 mg by mouth daily.    [provider]    Family History No family history on file.  Social History Social History   Tobacco Use  . Smoking status: Never Smoker  . Smokeless tobacco: Never Used  Substance Use Topics  . Alcohol use: No  . Drug use: No     Allergies   Penicillins   Review of Systems Review of Systems  Constitutional: Negative for chills and fever.  HENT: Negative for congestion and ear pain.   Eyes: Negative for pain.  Respiratory: Positive for cough and shortness of breath. Negative for chest tightness and wheezing.   Cardiovascular: Negative for chest pain and leg swelling.  Gastrointestinal:  Negative for abdominal pain, diarrhea, nausea and vomiting.  Genitourinary: Negative for dysuria.  Musculoskeletal: Positive for back pain and myalgias. Negative for gait problem, neck pain and neck stiffness.  Skin: Negative for rash.  Neurological: Negative for dizziness and headaches.     Physical Exam Updated Vital Signs BP (!) 143/102 (BP Location: Left Arm)   Pulse 98   Temp 98.8 F (37.1 C) (Oral)   Resp 20   Ht 6\' 1"  (1.854 m)   Wt 124.7 kg   SpO2 98%   BMI 36.28 kg/m   Physical Exam Vitals  signs and nursing note reviewed.  Constitutional:      General: He is not in acute distress. HENT:     Head: Normocephalic and atraumatic.     Nose: Nose normal.     Mouth/Throat:     Mouth: Mucous membranes are moist.     Pharynx: No posterior oropharyngeal erythema.  Eyes:     General: No scleral icterus.       Right eye: No discharge.        Left eye: No discharge.     Extraocular Movements: Extraocular movements intact.     Conjunctiva/sclera: Conjunctivae normal.  Neck:     Musculoskeletal: Normal range of motion. No muscular tenderness.  Cardiovascular:     Rate and Rhythm: Normal rate and regular rhythm.     Pulses: Normal pulses.     Heart sounds: Normal heart sounds.  Pulmonary:     Effort: Pulmonary effort is normal. No respiratory distress.     Breath sounds: No wheezing.  Abdominal:     Palpations: Abdomen is soft.     Tenderness: There is no abdominal tenderness.  Musculoskeletal:     Right lower leg: No edema.     Left lower leg: No edema.     Comments: Muscular tenderness midthoracic on the right sided paravertebral muscle groups with spasm.  Pinpoint tenderness over muscle group reproduces pain.  Full range of motion of neck and upper and lower extremity joints conducted after palpation was conducted. Patient has 5/5 strength in lower and upper extremities. No chest wall tenderness, no facial or cranial tenderness. Patient able to ambulate however difficulty with standing.   Lymphadenopathy:     Cervical: No cervical adenopathy.  Skin:    General: Skin is warm and dry.     Capillary Refill: Capillary refill takes less than 2 seconds.  Neurological:     Mental Status: He is alert. Mental status is at baseline.     Motor: No weakness.     Comments: Pulses intact in upper and lower extremities   Patient has intact sensation grossly in lower and upper extremities. Intact patellar and ankle reflexes.    Psychiatric:        Mood and Affect: Mood normal.         Behavior: Behavior normal.      ED Treatments / Results  Labs (all labs ordered are listed, but only abnormal results are displayed) Labs Reviewed  SARS CORONAVIRUS 2 (TAT 6-24 HRS)    EKG EKG Interpretation  Date/Time:  Monday October 28 2018 14:43:40 EDT Ventricular Rate:  91 PR Interval:    QRS Duration: 82 QT Interval:  352 QTC Calculation: 433 R Axis:   66 Text Interpretation:  Sinus rhythm Probable left atrial enlargement No significant change since last tracing Confirmed by Blanchie Dessert ([id number]) on 10/28/2018 4:03:27 PM   Radiology Dg Chest Port 1 View  Result Date:  10/28/2018 CLINICAL DATA:  Mid back pain. EXAM: PORTABLE CHEST 1 VIEW COMPARISON:  September 29, 2017 FINDINGS: Cardiomediastinal silhouette is normal. Mediastinal contours appear intact. Mild tortuosity of the aorta. There is no evidence of focal airspace consolidation, pleural effusion or pneumothorax. Osseous structures are without acute abnormality. Soft tissues are grossly normal. IMPRESSION: No active disease. Electronically Signed   By: Ted Mcalpineobrinka  Dimitrova M.D.   On: 10/28/2018 15:11    Procedures Procedures (including critical care time)  Medications Ordered in ED Medications  lidocaine (LIDODERM) 5 % 1 patch (has no administration in time range)  ketorolac (TORADOL) 30 MG/ML injection 30 mg (30 mg Intramuscular Given 10/28/18 1550)     Initial Impression / Assessment and Plan / ED Course  I have reviewed the triage vital signs and the nursing notes.  Pertinent labs & imaging results that were available during my care of the patient were reviewed by me and considered in my medical decision making (see chart for details).         Patient is 53 year old male with chronic hypertension presenting for general body aches, focal back pain, fatigue and cough with headache and intermittent chest pain with coughing.  Patient's primary complaint is right-sided back pain which is likely muscular  as I am able to reproduce the exact pain by pushing in the right paravertebral muscle group.  Patient endorses pain is worse with movement and bending over which is consistent with muscular etiology.  Patient also had abrupt onset of pain when swinging his legs of the bed and likely strained a muscle.  Patient has reassuring neurologic exam.  And no red flags for back pain.  Patient does not have any urinary symptoms or CVA tenderness to indicate pyelonephritis or kidney stone.   Doubt pulmonary embolism as patient has no clinical signs of DVT, heart rate is under 100, no history of surgery or immobilization recently, no history of VTE, no hemoptysis no history of cancer.  Furthermore patient has reassuring SPO2 of 98.  Patient states chest pain occurs with coughing but denies pleuritic chest pain with deep inhalation or if normal breathing.  Patient's chest pain is atypical for ACS.  Doubt ACS etiology of chest pain as patient only endorses pain with coughing and his shortness of breath is not exertional although he feels tired with exertion.  Patient denies nausea or vomiting, diaphoresis or sensation of chest pressure.  Discussed with patient's elevated blood pressure patient states he did not take his medication today however he usually does.  Patient will follow-up with primary care and continue take his blood pressure as prescribed and recheck when possible. Given toradol shot today and flexeril prescription. Provided script for lidocaine patches.    Final Clinical Impressions(s) / ED Diagnoses   Final diagnoses:  Viral upper respiratory tract infection with cough  Acute right-sided thoracic back pain    ED Discharge Orders         Ordered    cyclobenzaprine (FLEXERIL) 10 MG tablet  2 times daily PRN     10/28/18 1555           Gailen ShelterFondaw, Makayela Secrest S, [place] 10/29/18 0059    Gwyneth SproutPlunkett, Whitney, MD 10/29/18 1603

## 2018-10-28 NOTE — ED Triage Notes (Signed)
Pt presents with mid back pain that worsens with movement. Pt c/o SOB and stabbing pain when coughing. Pt also c/o HA. Symptoms began Friday.

## 2018-10-28 NOTE — Discharge Instructions (Addendum)
Please call and make follow-up appointment at the [facility] for reassessment.  Please have your blood pressure rechecked soon and continue to take your blood pressure medications.

## 2018-11-14 ENCOUNTER — Emergency Department (HOSPITAL_BASED_OUTPATIENT_CLINIC_OR_DEPARTMENT_OTHER)
Admission: EM | Admit: 2018-11-14 | Discharge: 2018-11-15 | Disposition: A | Discharge status: Home / Self Care | Payer: Self-pay | Attending: Emergency Medicine | Admitting: Emergency Medicine

## 2018-11-14 ENCOUNTER — Other Ambulatory Visit: Payer: Self-pay

## 2018-11-14 ENCOUNTER — Emergency Department (HOSPITAL_BASED_OUTPATIENT_CLINIC_OR_DEPARTMENT_OTHER): Payer: Self-pay

## 2018-11-14 ENCOUNTER — Encounter (HOSPITAL_BASED_OUTPATIENT_CLINIC_OR_DEPARTMENT_OTHER): Payer: Self-pay | Admitting: Emergency Medicine

## 2018-11-14 DIAGNOSIS — I10 Essential (primary) hypertension: Secondary | ICD-10-CM | POA: Insufficient documentation

## 2018-11-14 DIAGNOSIS — R0602 Shortness of breath: Secondary | ICD-10-CM

## 2018-11-14 DIAGNOSIS — R079 Chest pain, unspecified: Secondary | ICD-10-CM

## 2018-11-14 DIAGNOSIS — J069 Acute upper respiratory infection, unspecified: Secondary | ICD-10-CM | POA: Insufficient documentation

## 2018-11-14 DIAGNOSIS — Z79899 Other long term (current) drug therapy: Secondary | ICD-10-CM | POA: Insufficient documentation

## 2018-11-14 DIAGNOSIS — R0789 Other chest pain: Secondary | ICD-10-CM | POA: Insufficient documentation

## 2018-11-14 DIAGNOSIS — Z20828 Contact with and (suspected) exposure to other viral communicable diseases: Secondary | ICD-10-CM | POA: Insufficient documentation

## 2018-11-14 LAB — TROPONIN I (HIGH SENSITIVITY): Troponin I (High Sensitivity): 5 ng/L (ref <18)

## 2018-11-14 LAB — COMPREHENSIVE METABOLIC PANEL
ALT: 40 U/L (ref 0–44)
AST: 30 U/L (ref 15–41)
Albumin: 4.2 g/dL (ref 3.5–5.0)
Alkaline Phosphatase: 72 U/L (ref 38–126)
Anion gap: 7 (ref 5–15)
BUN: 14 mg/dL (ref 6–20)
CO2: 26 mmol/L (ref 22–32)
Calcium: 9.2 mg/dL (ref 8.9–10.3)
Chloride: 107 mmol/L (ref 98–111)
Creatinine, Ser: 0.81 mg/dL (ref 0.61–1.24)
GFR calc Af Amer: >60 mL/min (ref >60)
GFR calc non Af Amer: >60 mL/min (ref >60)
Glucose, Bld: 102 mg/dL — ABNORMAL HIGH (ref 70–99)
Potassium: 3.7 mmol/L (ref 3.5–5.1)
Sodium: 140 mmol/L (ref 135–145)
Total Bilirubin: 0.5 mg/dL (ref 0.3–1.2)
Total Protein: 7.5 g/dL (ref 6.5–8.1)

## 2018-11-14 LAB — CBC WITH DIFFERENTIAL/PLATELET
Abs Immature Granulocytes: 0.01 10*3/uL (ref 0.00–0.07)
Basophils Absolute: 0 10*3/uL (ref 0.0–0.1)
Basophils Relative: 0 %
Eosinophils Absolute: 0.1 10*3/uL (ref 0.0–0.5)
Eosinophils Relative: 1 %
HCT: 45.9 % (ref 39.0–52.0)
Hemoglobin: 14.6 g/dL (ref 13.0–17.0)
Immature Granulocytes: 0 %
Lymphocytes Relative: 39 %
Lymphs Abs: 3 10*3/uL (ref 0.7–4.0)
MCH: 28 pg (ref 26.0–34.0)
MCHC: 31.8 g/dL (ref 30.0–36.0)
MCV: 87.9 fL (ref 80.0–100.0)
Monocytes Absolute: 0.6 10*3/uL (ref 0.1–1.0)
Monocytes Relative: 8 %
Neutro Abs: 3.9 10*3/uL (ref 1.7–7.7)
Neutrophils Relative %: 52 %
Platelets: 246 10*3/uL (ref 150–400)
RBC: 5.22 MIL/uL (ref 4.22–5.81)
RDW: 13.4 % (ref 11.5–15.5)
WBC: 7.6 10*3/uL (ref 4.0–10.5)
nRBC: 0 % (ref 0.0–0.2)

## 2018-11-14 LAB — LIPASE, BLOOD: Lipase: 25 U/L (ref 11–51)

## 2018-11-14 LAB — BRAIN NATRIURETIC PEPTIDE: B Natriuretic Peptide: 14.9 pg/mL (ref 0.0–100.0)

## 2018-11-14 MED ORDER — ACETAMINOPHEN 325 MG PO TABS
650.0000 mg | ORAL_TABLET | Freq: Once | ORAL | Status: AC | PRN
  Administered 2018-11-14: 650 mg via ORAL

## 2018-11-14 MED ORDER — IOHEXOL 350 MG/ML SOLN
100.0000 mL | Freq: Once | INTRAVENOUS | Status: AC | PRN
  Administered 2018-11-14: 100 mL via INTRAVENOUS

## 2018-11-14 MED ORDER — SODIUM CHLORIDE 0.9 % IV BOLUS
1000.0000 mL | Freq: Once | INTRAVENOUS | Status: AC
  Administered 2018-11-14: 21:00:00 1000 mL via INTRAVENOUS

## 2018-11-14 MED ORDER — DIPHENHYDRAMINE HCL 50 MG/ML IJ SOLN
25.0000 mg | Freq: Once | INTRAMUSCULAR | Status: AC
  Administered 2018-11-14: 21:00:00 25 mg via INTRAVENOUS
  Filled 2018-11-14: qty 1

## 2018-11-14 MED ORDER — ACETAMINOPHEN 325 MG PO TABS
ORAL_TABLET | ORAL | Status: AC
  Filled 2018-11-14: qty 2

## 2018-11-14 MED ORDER — PROCHLORPERAZINE EDISYLATE 10 MG/2ML IJ SOLN
10.0000 mg | Freq: Once | INTRAMUSCULAR | Status: AC
  Administered 2018-11-14: 10 mg via INTRAVENOUS
  Filled 2018-11-14: qty 2

## 2018-11-14 NOTE — ED Provider Notes (Signed)
[facility] EMERGENCY DEPARTMENT Provider Note   CSN: [id number] Arrival date & time: 11/14/18  1805     History   Chief Complaint Chief Complaint  Patient presents with  . Shortness of Breath    HPI Randy Rose is a 53 y.o. male.     The history is provided by the patient and medical records. No language interpreter was used.  Chest Pain Pain location:  L chest and substernal area Pain quality: aching and sharp   Pain radiates to:  Does not radiate Pain severity:  Moderate Onset quality:  Gradual Duration:  3 days Timing:  Constant Progression:  Waxing and waning Chronicity:  New Context: breathing   Relieved by:  Nothing Worsened by:  Coughing and deep breathing Associated symptoms: cough, fatigue, headache and shortness of breath   Associated symptoms: no abdominal pain, no anxiety, no back pain, no claudication, no diaphoresis, no fever, no heartburn, no lower extremity edema, no nausea, no near-syncope, no palpitations, no vomiting and no weakness   Risk factors: male sex     Past Medical History:  Diagnosis Date  . Depression   . Hypertension     There are no active problems to display for this patient.   History reviewed. No pertinent surgical history.      Home Medications    Prior to Admission medications   Medication Sig Start Date End Date Taking? Authorizing Provider  cyclobenzaprine (FLEXERIL) 10 MG tablet Take 1 tablet (10 mg total) by mouth 2 (two) times daily as needed for muscle spasms. 10/28/18   Gailen Shelter, PA  hydrOXYzine (ATARAX/VISTARIL) 25 MG tablet Take 25 mg by mouth 3 (three) times daily as needed.    [provider]  ibuprofen (ADVIL,MOTRIN) 800 MG tablet Take 1 tablet (800 mg total) by mouth every 8 (eight) hours as needed. 10/18/16   Long, Arlyss Repress, MD  lisinopril (PRINIVIL,ZESTRIL) 20 MG tablet Take 20 mg by mouth daily.    [provider]  lisinopril (PRINIVIL,ZESTRIL) 20 MG tablet Take 1  tablet (20 mg total) by mouth daily. 09/29/17   Vanetta Mulders, MD  sertraline (ZOLOFT) 50 MG tablet Take 50 mg by mouth daily.    [provider]    Family History No family history on file.  Social History Social History   Tobacco Use  . Smoking status: Never Smoker  . Smokeless tobacco: Never Used  Substance Use Topics  . Alcohol use: No  . Drug use: No     Allergies   Penicillins   Review of Systems Review of Systems  Constitutional: Positive for chills and fatigue. Negative for diaphoresis and fever.  HENT: Negative for congestion.   Eyes: Positive for photophobia. Negative for visual disturbance.  Respiratory: Positive for cough, chest tightness and shortness of breath. Negative for wheezing and stridor.   Cardiovascular: Positive for chest pain. Negative for palpitations, claudication and near-syncope.  Gastrointestinal: Negative for abdominal pain, constipation, diarrhea, heartburn, nausea and vomiting.  Genitourinary: Negative for flank pain and frequency.  Musculoskeletal: Negative for back pain, neck pain and neck stiffness.  Skin: Negative for rash and wound.  Neurological: Positive for headaches. Negative for weakness and light-headedness.  Psychiatric/Behavioral: Negative for agitation and confusion.  All other systems reviewed and are negative.    Physical Exam Updated Vital Signs BP (!) 150/95   Pulse (!) 107   Temp 100.1 F (37.8 C) (Oral)   Resp (!) 21   Ht  (1.854 m)  Wt 117.9 kg   SpO2 96%   BMI 34.30 kg/m   Physical Exam Vitals signs and nursing note reviewed.  Constitutional:      General: He is not in acute distress.    Appearance: He is well-developed. He is obese. He is not ill-appearing, toxic-appearing or diaphoretic.  HENT:     Head: Normocephalic and atraumatic.     Mouth/Throat:     Mouth: Mucous membranes are moist.     Pharynx: No pharyngeal swelling or oropharyngeal exudate.  Eyes:     Extraocular  Movements: Extraocular movements intact.     Conjunctiva/sclera: Conjunctivae normal.     Pupils: Pupils are equal, round, and reactive to light.  Neck:     Musculoskeletal: Neck supple.  Cardiovascular:     Rate and Rhythm: Regular rhythm. Tachycardia present.     Pulses: Normal pulses.     Heart sounds: No murmur.  Pulmonary:     Effort: Pulmonary effort is normal. No respiratory distress.     Breath sounds: Normal breath sounds. No wheezing, rhonchi or rales.  Chest:     Chest wall: No tenderness.  Abdominal:     Palpations: Abdomen is soft.     Tenderness: There is no abdominal tenderness.  Musculoskeletal:     Right lower leg: He exhibits no tenderness. No edema.     Left lower leg: He exhibits tenderness. No edema.  Skin:    General: Skin is warm and dry.     Capillary Refill: Capillary refill takes less than 2 seconds.     Findings: No erythema.  Neurological:     General: No focal deficit present.     Mental Status: He is alert.  Psychiatric:        Mood and Affect: Mood normal.      ED Treatments / Results  Labs (all labs ordered are listed, but only abnormal results are displayed) Labs Reviewed  COMPREHENSIVE METABOLIC PANEL - Abnormal; Notable for the following components:      Result Value   Glucose, Bld 102 (*)    All other components within normal limits  NOVEL CORONAVIRUS, NAA (HOSP ORDER, SEND-OUT TO REF LAB; TAT 18-24 HRS)  CBC WITH DIFFERENTIAL/PLATELET  BRAIN NATRIURETIC PEPTIDE  LIPASE, BLOOD  TROPONIN I (HIGH SENSITIVITY)  TROPONIN I (HIGH SENSITIVITY)    EKG EKG Interpretation  Date/Time:  Thursday November 14 2018 18:16:03 EDT Ventricular Rate:  109 PR Interval:  146 QRS Duration: 82 QT Interval:  332 QTC Calculation: 447 R Axis:   66 Text Interpretation: Sinus tachycardia Otherwise normal ECG When compared to prior, faster rate. No STEMI Confirmed by Theda Belfast ([id number]) on 11/14/2018 6:22:17 PM   Radiology Ct Angio Chest Pe W  And/or Wo Contrast  Result Date: 11/14/2018 CLINICAL DATA:  Chest pain, recent long drive with shortness of breath EXAM: CT ANGIOGRAPHY CHEST WITH CONTRAST TECHNIQUE: Multidetector CT imaging of the chest was performed using the standard protocol during bolus administration of intravenous contrast. Multiplanar CT image reconstructions and MIPs were obtained to evaluate the vascular anatomy. CONTRAST:  OMNIPAQUE IOHEXOL 350 MG/ML SOLN COMPARISON:  08/02/2015 FINDINGS: Cardiovascular: Thoracic aorta demonstrates atherosclerotic calcifications. No dissection or aneurysmal dilatation is seen. No cardiac enlargement is noted. The pulmonary artery is well visualized with a normal branching pattern. No intraluminal filling defect to suggest pulmonary embolism is noted. Coronary calcifications are seen. Mediastinum/Nodes: Thoracic inlet is within normal limits. No sizable hilar or mediastinal adenopathy is noted. The esophagus is within  normal limits. Lungs/Pleura: Mild dependent atelectatic changes are noted. No focal infiltrate or sizable effusion is seen. A tiny subpleural nodule is noted laterally in the right upper lobe stable in appearance from the prior exam. No new focal nodules are seen. Upper Abdomen: Visualized upper abdomen is within normal limits. Musculoskeletal: Degenerative changes of the thoracic spine are noted. Review of the MIP images confirms the above findings. IMPRESSION: No evidence of pulmonary emboli. Tiny right upper lobe nodule stable from 2017 consistent with a benign etiology. No further follow-up is noted. Coronary calcifications are seen. Aortic Atherosclerosis (ICD10-I70.0). Electronically Signed   By: Inez Catalina M.D.   On: 11/14/2018 22:01   US Venous Img Lower Unilateral Left  Result Date: 11/14/2018 CLINICAL DATA:  Left leg pain and swelling for 1 week, history of recent long car ride EXAM: LEFT LOWER EXTREMITY VENOUS DOPPLER ULTRASOUND TECHNIQUE: Gray-scale sonography  with graded compression, as well as color Doppler and duplex ultrasound were performed to evaluate the lower extremity deep venous systems from the level of the common femoral vein and including the common femoral, femoral, profunda femoral, popliteal and calf veins including the posterior tibial, peroneal and gastrocnemius veins when visible. The superficial great saphenous vein was also interrogated. Spectral Doppler was utilized to evaluate flow at rest and with distal augmentation maneuvers in the common femoral, femoral and popliteal veins. COMPARISON:  None. FINDINGS: Contralateral Common Femoral Vein: Respiratory phasicity is normal and symmetric with the symptomatic side. No evidence of thrombus. Normal compressibility. Common Femoral Vein: No evidence of thrombus. Normal compressibility, respiratory phasicity and response to augmentation. Saphenofemoral Junction: No evidence of thrombus. Normal compressibility and flow on color Doppler imaging. Profunda Femoral Vein: No evidence of thrombus. Normal compressibility and flow on color Doppler imaging. Femoral Vein: No evidence of thrombus. Normal compressibility, respiratory phasicity and response to augmentation. Popliteal Vein: No evidence of thrombus. Normal compressibility, respiratory phasicity and response to augmentation. Calf Veins: No evidence of thrombus. Normal compressibility and flow on color Doppler imaging. Superficial Great Saphenous Vein: No evidence of thrombus. Normal compressibility. Venous Reflux:  None. Other Findings:  None. IMPRESSION: No evidence of deep venous thrombosis. Electronically Signed   By: Inez Catalina M.D.   On: 11/14/2018 21:52   Dg Chest Portable 1 View  Result Date: 11/14/2018 CLINICAL DATA:  Chest pain and intermittent shortness of breath over the past few weeks. EXAM: PORTABLE CHEST 1 VIEW COMPARISON:  Chest x-ray dated October 28, 2018. FINDINGS: The heart size and mediastinal contours are within normal limits.  Both lungs are clear. The visualized skeletal structures are unremarkable. IMPRESSION: No active disease. Electronically Signed   By: Titus Dubin M.D.   On: 11/14/2018 19:34    Procedures Procedures (including critical care time)  Medications Ordered in ED Medications  acetaminophen (TYLENOL) tablet 650 mg (650 mg Oral Given 11/14/18 1847)  sodium chloride 0.9 % bolus 1,000 mL ( Intravenous Rate/Dose Verify 11/14/18 2235)  prochlorperazine (COMPAZINE) injection 10 mg (10 mg Intravenous Given 11/14/18 2057)  diphenhydrAMINE (BENADRYL) injection 25 mg (25 mg Intravenous Given 11/14/18 2056)  iohexol (OMNIPAQUE) 350 MG/ML injection 100 mL (100 mLs Intravenous Contrast Given 11/14/18 2151)     Initial Impression / Assessment and Plan / ED Course  I have reviewed the triage vital signs and the nursing notes.  Pertinent labs & imaging results that were available during my care of the patient were reviewed by me and considered in my medical decision making (see chart for details).  Kiet Geer is a 53 y.o. male with a past medical history significant for hypertension and depression who presents with chest pain, shortness of breath, cough, left leg pain, and headache.  Patient reports that several weeks ago he went to a wedding in [place] with a long drive.  He reports that after the drive he developed pain in his left calf and a knot.  He has no history of DVT or PE.  He reports that the Groom tested positive for COVID-19 infection after his honeymoon, unclear if the patient was infected during the wedding.  Patient says he has been seen once previously for similar symptoms of cough, chest pain, shortness breath and had a negative Covid test.  Patient reports his symptoms have worsened over the last several days and now he is having more pleuritic chest pain shortness of breath and fatigue.  He reports he is shortness of breath laying flat at night.  He reports he is developed  some headaches that are moderate to severe with photophobia.  He denies any neck stiffness at this time.  On exam, lungs are clear and chest is nontender.  Abdomen is nontender.  Patient moving all extremities.  Patient has some tenderness in his left calf but good pulse, strength and sensation in the foot.  Patient had normal oxygen saturations on room air but was slightly tachycardic on my exam.  EKG shows no STEMI.  I am concerned for several things.  Given his recent Covid exposure, dissipate retesting for COVID-19 when disposition is completed.  We will get a ultrasound of the leg to rule out DVT as well as a CT PE study to rule out PE.  Will get labs.  Will give headache cocktail and fluids to help with headache.  Anticipate reassessment after work-up to determine disposition.  Patient reports headache resolved with medications.  His other symptoms are also feeling better.  Patient will be Covid tested due to the possible exposure with his chest pain, shortness breath, and cough.  Patient does not appear to be septic.  Labs and imaging otherwise reassuring.  No evidence of PE on CT and ultrasound showed no DVT.  Patient agreed with plan to follow-up with PCP and conservatively treat likely URI versus musculoskeletal pain.  Patient reports he has muscle relaxants at home and will use them.  He did not feel nausea or any nausea medicine on discharge.  Patient understood return precautions and was discharged in good condition with improved symptoms.  Final Clinical Impressions(s) / ED Diagnoses   Final diagnoses:  SOB (shortness of breath)  Chest pain, unspecified type  Upper respiratory tract infection, unspecified type    ED Discharge Orders    None      Clinical Impression: 1. SOB (shortness of breath)   2. Chest pain, unspecified type   3. Upper respiratory tract infection, unspecified type     Disposition: Discharge  Condition: Good  I have discussed the results, Dx and  Tx plan with the pt(& family if present). He/she/they expressed understanding and agree(s) with the plan. Discharge instructions discussed at great length. Strict return precautions discussed and pt &/or family have verbalized understanding of the instructions. No further questions at time of discharge.    New Prescriptions   No medications on file    Follow Up: [facility] [address] [place] [address] [phone] Schedule an appointment as soon as possible for a visit    [facility] EM[facility]GENCY DEPART[facility] [address] [id number] mc [address] [id number] [phone]       Mulki Roesler, Canary Brimhristopher J, MD 11/15/18 [phone]

## 2018-11-14 NOTE — ED Notes (Signed)
ED Provider at bedside. 

## 2018-11-14 NOTE — ED Triage Notes (Signed)
Intermittent SOB for a few weeks with chest pain and headache. Had a neg COVID test 2 weeks ago.

## 2018-11-14 NOTE — ED Notes (Signed)
Pt had a positive contact with a COVID + person at the beginning of October.

## 2018-11-15 NOTE — Discharge Instructions (Signed)
Your work-up today was overall reassuring and we did not see evidence of blood clot in your legs or lungs.  Your heart enzyme was negative and your other work-up was reassuring.  Given the exposures you have, we tested you for coronavirus, will likely return in the next 24 hours.  Please follow-up on my chart for this result and if it is positive, you will likely be called.  I suspect your symptoms are more musculoskeletal versus related to the upper respiratory infection.  Please rest and stay hydrated.  Please follow-up with your PCP.  If any symptoms change or worsen, please return to the nearest emergency department.

## 2018-11-17 LAB — NOVEL CORONAVIRUS, NAA (HOSP ORDER, SEND-OUT TO REF LAB; TAT 18-24 HRS): SARS-CoV-2, NAA: NOT DETECTED

## 2019-01-30 DIAGNOSIS — F330 Major depressive disorder, recurrent, mild: Secondary | ICD-10-CM | POA: Insufficient documentation

## 2019-01-30 DIAGNOSIS — J4520 Mild intermittent asthma, uncomplicated: Secondary | ICD-10-CM | POA: Diagnosis present

## 2019-01-30 DIAGNOSIS — Z6834 Body mass index (BMI) 34.0-34.9, adult: Secondary | ICD-10-CM | POA: Insufficient documentation

## 2019-02-10 ENCOUNTER — Other Ambulatory Visit: Payer: Self-pay

## 2019-02-10 ENCOUNTER — Emergency Department
Admission: EM | Admit: 2019-02-10 | Discharge: 2019-02-10 | Disposition: A | Discharge status: Home / Self Care | Payer: 59 | Source: Home / Self Care | Attending: Family Medicine | Admitting: Family Medicine

## 2019-02-10 ENCOUNTER — Encounter: Payer: Self-pay | Admitting: Emergency Medicine

## 2019-02-10 DIAGNOSIS — J029 Acute pharyngitis, unspecified: Secondary | ICD-10-CM

## 2019-02-10 DIAGNOSIS — J069 Acute upper respiratory infection, unspecified: Secondary | ICD-10-CM

## 2019-02-10 LAB — POCT RAPID STREP A (OFFICE): Rapid Strep A Screen: NEGATIVE

## 2019-02-10 NOTE — ED Provider Notes (Signed)
[facility]    CSN: [id number] Arrival date & time: 02/10/19  1653      History   Chief Complaint Chief Complaint  Patient presents with  . Headache    HPI Damany Hermiz is a 54 y.o. male.   Five days ago patient developed headache, fatigue, sinus congestion, myalgias, mild sore throat, and sweats.  He has developed a mild cough at night, and notes that he has been mildly winded with activity.  He has had a change in his ability to taste/smell.  The history is provided by the patient.    Past Medical History:  Diagnosis Date  . Depression   . Hypertension     There are no problems to display for this patient.   History reviewed. No pertinent surgical history.     Home Medications    Prior to Admission medications   Medication Sig Start Date End Date Taking? Authorizing Provider  ibuprofen (ADVIL,MOTRIN) 800 MG tablet Take 1 tablet (800 mg total) by mouth every 8 (eight) hours as needed. 10/18/16   Long, Arlyss Repress, MD  lisinopril (PRINIVIL,ZESTRIL) 20 MG tablet Take 20 mg by mouth daily.    [provider]  lisinopril (PRINIVIL,ZESTRIL) 20 MG tablet Take 1 tablet (20 mg total) by mouth daily. 09/29/17   Vanetta Mulders, MD    Family History Family History  Problem Relation Age of Onset  . Hypertension Mother   . Hyperlipidemia Mother   . Hyperlipidemia Father   . Hypertension Father     Social History Social History   Tobacco Use  . Smoking status: Never Smoker  . Smokeless tobacco: Never Used  Substance Use Topics  . Alcohol use: No  . Drug use: No     Allergies   Penicillins   Review of Systems Review of Systems  + sore throat + cough No pleuritic pain No wheezing + nasal congestion + post-nasal drainage No sinus pain/pressure No itchy/red eyes + right earache No hemoptysis No SOB No fever, + chills/sweats No nausea No vomiting No abdominal pain + diarrhea, resolved No urinary symptoms No skin rash +  fatigue + myalgias + headache Used OTC meds without relief  Physical Exam Triage Vital Signs ED Triage Vitals  Enc Vitals Group     BP 02/10/19 1826 (!) 157/94     Pulse Rate 02/10/19 1826 77     Resp --      Temp 02/10/19 1826 98.3 F (36.8 C)     Temp Source 02/10/19 1826 Oral     SpO2 02/10/19 1826 97 %     Weight 02/10/19 1827 262 lb (118.8 kg)     Height 02/10/19 1827 6\' 2"  (1.88 m)     Head Circumference --      Peak Flow --      Pain Score 02/10/19 1827 9     Pain Loc --      Pain Edu? --      Excl. in GC? --    No data found.  Updated Vital Signs BP (!) 157/94 (BP Location: Right Arm)   Pulse 77   Temp 98.3 F (36.8 C) (Oral)   Ht 6\' 2"  (1.88 m)   Wt 118.8 kg   SpO2 97%   BMI 33.64 kg/m   Visual Acuity Right Eye Distance:   Left Eye Distance:   Bilateral Distance:    Right Eye Near:   Left Eye Near:    Bilateral Near:     Physical  Exam Nursing notes and Vital Signs reviewed. Appearance:  Patient appears stated age, and in no acute distress Eyes:  Pupils are equal, round, and reactive to light and accomodation.  Extraocular movement is intact.  Conjunctivae are not inflamed  Ears:  Canals normal.  Tympanic membranes normal.  Nose:  Mildly congested turbinates.  No sinus tenderness. Pharynx:  Normal Neck:  Supple.  Bilateral tender shotty nodes.   Lungs:  Clear to auscultation.  Breath sounds are equal.  Moving air well. Heart:  Regular rate and rhythm without murmurs, rubs, or gallops.  Abdomen:  Nontender without masses or hepatosplenomegaly.  Bowel sounds are present.  No CVA or flank tenderness.  Extremities:  No edema.  Skin:  No rash present.   UC Treatments / Results  Labs (all labs ordered are listed, but only abnormal results are displayed) Labs Reviewed  STREP A DNA PROBE  NOVEL CORONAVIRUS, NAA  POCT RAPID STREP A (OFFICE) negative    EKG   Radiology No results found.  Procedures Procedures (including critical care  time)  Medications Ordered in UC Medications - No data to display  Initial Impression / Assessment and Plan / UC Course  I have reviewed the triage vital signs and the nursing notes.  Pertinent labs & imaging results that were available during my care of the patient were reviewed by me and considered in my medical decision making (see chart for details).    Benign exam.  There is no evidence of bacterial infection today.  Treat symptomatically for now. COVID19 send out.   Final Clinical Impressions(s) / UC Diagnoses   Final diagnoses:  Acute pharyngitis, unspecified etiology  Viral URI with cough     Discharge Instructions     Take plain guaifenesin (1200mg  extended release tabs such as Mucinex) twice daily, with plenty of water, for cough and congestion. Get adequate rest.   May use Afrin nasal spray (or generic oxymetazoline) each morning for about 5 days and then discontinue.  Also recommend using saline nasal spray several times daily and saline nasal irrigation (AYR is a common brand).  Use Flonase nasal spray each morning after using Afrin nasal spray and saline nasal irrigation. Try warm salt water gargles for sore throat.  Stop all antihistamines (Coricidin, etc) for now, and other non-prescription cough/cold preparations. May take Tylenol for headache, body aches, etc. May take Delsym Cough Suppressant at bedtime for nighttime cough.   Isolate yourself until COVID-19 test result is available.   If your COVID19 test is positive, then you are infected with the novel coronavirus and could give the virus to others.  Please continue isolation at home for at least 10 days since the start of your symptoms. Once you complete your 10 day quarantine, you may return to normal activities as long as you've not had a fever for over 24 hours (without taking fever reducing medicine) and your symptoms are improving. Please continue good preventive care measures, including:  frequent  hand-washing, avoid touching your face, cover coughs/sneezes, stay out of crowds and keep a 6 foot distance from others.  Go to the nearest hospital emergency room if fever/cough/breathlessness are severe or illness seems like a threat to life.     ED Prescriptions    None        Kandra Nicolas, MD 02/12/19 1352

## 2019-02-10 NOTE — Discharge Instructions (Addendum)
Take plain guaifenesin (1200mg  extended release tabs such as Mucinex) twice daily, with plenty of water, for cough and congestion. Get adequate rest.   May use Afrin nasal spray (or generic oxymetazoline) each morning for about 5 days and then discontinue.  Also recommend using saline nasal spray several times daily and saline nasal irrigation (AYR is a common brand).  Use Flonase nasal spray each morning after using Afrin nasal spray and saline nasal irrigation. Try warm salt water gargles for sore throat.  Stop all antihistamines (Coricidin, etc) for now, and other non-prescription cough/cold preparations. May take Tylenol for headache, body aches, etc. May take Delsym Cough Suppressant at bedtime for nighttime cough.   Isolate yourself until COVID-19 test result is available.   If your COVID19 test is positive, then you are infected with the novel coronavirus and could give the virus to others.  Please continue isolation at home for at least 10 days since the start of your symptoms. Once you complete your 10 day quarantine, you may return to normal activities as long as you've not had a fever for over 24 hours (without taking fever reducing medicine) and your symptoms are improving. Please continue good preventive care measures, including:  frequent hand-washing, avoid touching your face, cover coughs/sneezes, stay out of crowds and keep a 6 foot distance from others.  Go to the nearest hospital emergency room if fever/cough/breathlessness are severe or illness seems like a threat to life.

## 2019-02-10 NOTE — ED Triage Notes (Signed)
Headache, body aches, night sweats, sore throat, RT ear pain x 5 days

## 2019-02-11 LAB — STREP A DNA PROBE: Group A Strep Probe: NOT DETECTED

## 2019-02-12 LAB — NOVEL CORONAVIRUS, NAA: SARS-CoV-2, NAA: NOT DETECTED

## 2019-02-20 DIAGNOSIS — B009 Herpesviral infection, unspecified: Secondary | ICD-10-CM | POA: Insufficient documentation

## 2019-03-15 ENCOUNTER — Other Ambulatory Visit: Payer: Self-pay

## 2019-03-15 ENCOUNTER — Emergency Department
Admission: EM | Admit: 2019-03-15 | Discharge: 2019-03-15 | Disposition: A | Discharge status: Home / Self Care | Payer: 59 | Source: Home / Self Care | Attending: Family Medicine | Admitting: Family Medicine

## 2019-03-15 ENCOUNTER — Encounter: Payer: Self-pay | Admitting: Emergency Medicine

## 2019-03-15 DIAGNOSIS — M273 Alveolitis of jaws: Secondary | ICD-10-CM

## 2019-03-15 MED ORDER — AMOXICILLIN 875 MG PO TABS: 875.0000 mg | ORAL_TABLET | Freq: Two times a day (BID) | ORAL | 0 refills | Status: DC

## 2019-03-15 MED ORDER — HYDROCODONE-ACETAMINOPHEN 5-325 MG PO TABS: 1.0000 | ORAL_TABLET | Freq: Four times a day (QID) | ORAL | 0 refills | Status: DC | PRN

## 2019-03-15 NOTE — ED Provider Notes (Signed)
[facility]    CSN: [id number] Arrival date & time: 03/15/19  0919      History   Chief Complaint Chief Complaint  Patient presents with  . Dental Pain    post oral surgery    HPI Randy Rose is a 54 y.o. male.   Patient had tooth #30 extracted 4 days ago.  He has gradually developed increasing pain at the extraction site, and has had persistent mild swelling of his right lateral cheek at the angle of his jaw.  He has examined the extraction site and believes that he has a "dry socket."  He feels well otherwise and denies fevers, chills, and sweats.  No trismus.  He has been taking ibuprofen 800mg   The history is provided by the patient.  Dental Pain Location:  Lower Lower teeth location:  30/RL 1st molar Quality:  Aching and constant Severity:  Moderate Onset quality:  Gradual Duration:  4 days Timing:  Constant Progression:  Worsening Chronicity:  New Context: recent dental surgery   Relieved by:  Nothing Worsened by:  Pressure Ineffective treatments:  NSAIDs Associated symptoms: facial pain, facial swelling and gum swelling   Associated symptoms: no difficulty swallowing, no drooling, no fever, no headaches, no neck swelling, no oral bleeding and no trismus     Past Medical History:  Diagnosis Date  . Depression   . Hypertension     There are no problems to display for this patient.   History reviewed. No pertinent surgical history.     Home Medications    Prior to Admission medications   Medication Sig Start Date End Date Taking? Authorizing Provider  vortioxetine HBr (TRINTELLIX) 10 MG TABS tablet Take 10 mg by mouth daily.   Yes [provider]  amoxicillin (AMOXIL) 875 MG tablet Take 1 tablet (875 mg total) by mouth 2 (two) times daily. 03/15/19   03/17/19, MD  HYDROcodone-acetaminophen (NORCO/VICODIN) 5-325 MG tablet Take 1 tablet by mouth every 6 (six) hours as needed for moderate pain or severe pain. 03/15/19    03/17/19, MD  ibuprofen (ADVIL,MOTRIN) 800 MG tablet Take 1 tablet (800 mg total) by mouth every 8 (eight) hours as needed. 10/18/16   Long, 12/18/16, MD  lisinopril (PRINIVIL,ZESTRIL) 20 MG tablet Take 20 mg by mouth daily.    [provider]  lisinopril (PRINIVIL,ZESTRIL) 20 MG tablet Take 1 tablet (20 mg total) by mouth daily. 09/29/17   10/01/17, MD    Family History Family History  Problem Relation Age of Onset  . Hypertension Mother   . Hyperlipidemia Mother   . Hyperlipidemia Father   . Hypertension Father     Social History Social History   Tobacco Use  . Smoking status: Never Smoker  . Smokeless tobacco: Never Used  Substance Use Topics  . Alcohol use: No  . Drug use: No     Allergies   Patient has no active allergies.   Review of Systems Review of Systems  Constitutional: Negative for chills, diaphoresis, fatigue and fever.  HENT: Positive for facial swelling. Negative for drooling, sore throat and trouble swallowing.   Neurological: Negative for headaches.  All other systems reviewed and are negative.    Physical Exam Triage Vital Signs ED Triage Vitals  Enc Vitals Group     BP 03/15/19 0945 (!) 152/89     Pulse Rate 03/15/19 0945 80     Resp 03/15/19 0945 18     Temp 03/15/19 0945  98.4 F (36.9 C)     Temp Source 03/15/19 0945 Oral     SpO2 03/15/19 0945 96 %     Weight 03/15/19 0947 260 lb 2.3 oz (118 kg)     Height 03/15/19 0947 6\' 2"  (1.88 m)     Head Circumference --      Peak Flow --      Pain Score 03/15/19 0946 10     Pain Loc --      Pain Edu? --      Excl. in GC? --    No data found.  Updated Vital Signs BP (!) 152/89 (BP Location: Right Arm)   Pulse 80   Temp 98.4 F (36.9 C) (Oral)   Resp 18   Ht 6\' 2"  (1.88 m)   Wt 118 kg   SpO2 96%   BMI 33.40 kg/m   Visual Acuity Right Eye Distance:   Left Eye Distance:   Bilateral Distance:    Right Eye Near:   Left Eye Near:    Bilateral Near:      Physical Exam Vitals and nursing note reviewed.  Constitutional:      General: He is not in acute distress. HENT:     Head: Normocephalic.     Right Ear: External ear normal.     Left Ear: External ear normal.     Nose: Nose normal.     Mouth/Throat:     Mouth: Mucous membranes are moist.      Comments: "Dry socket" noted at position #30 with superficial bone exposure.  Gingiva tender but not swollen.  Mild tenderness and minimal swelling over right cheek at angle of jaw.  No trismus. Eyes:     Pupils: Pupils are equal, round, and reactive to light.  Cardiovascular:     Rate and Rhythm: Normal rate.  Pulmonary:     Effort: Pulmonary effort is normal.  Lymphadenopathy:     Cervical: No cervical adenopathy.  Skin:    General: Skin is warm and dry.  Neurological:     Mental Status: He is alert.      UC Treatments / Results  Labs (all labs ordered are listed, but only abnormal results are displayed) Labs Reviewed - No data to display  EKG   Radiology No results found.  Procedures Procedures (including critical care time)  Medications Ordered in UC Medications - No data to display  Initial Impression / Assessment and Plan / UC Course  I have reviewed the triage vital signs and the nursing notes.  Pertinent labs & imaging results that were available during my care of the patient were reviewed by me and considered in my medical decision making (see chart for details).    Begin empiric amoxicillin.  Rx for Lortab (#15, no refill). Controlled Substance Prescriptions I have consulted the [place] Controlled Substances Registry for this patient, and feel the risk/benefit ratio today is favorable for proceeding with this prescription for a controlled substance.   Followup with dentist as soon as possible.   Final Clinical Impressions(s) / UC Diagnoses   Final diagnoses:  Dry tooth socket     Discharge Instructions     Continue warm salt water gargles several times  daily.  Continue a mouthwash such as Listerine several times daily. May continue Ibuprofen 800mg  every 8 hours with food.  If symptoms become significantly worse during the night or over the weekend, proceed to the local emergency room.     ED Prescriptions  Medication Sig Dispense Auth. Provider   amoxicillin (AMOXIL) 875 MG tablet Take 1 tablet (875 mg total) by mouth 2 (two) times daily. 20 tablet Kandra Nicolas, MD   HYDROcodone-acetaminophen (NORCO/VICODIN) 5-325 MG tablet Take 1 tablet by mouth every 6 (six) hours as needed for moderate pain or severe pain. 15 tablet Kandra Nicolas, MD        Kandra Nicolas, MD 03/15/19 1014

## 2019-03-15 NOTE — ED Triage Notes (Signed)
Patient had tooth #30 extracted 03/11/19; over next 2 days pain in area increased rather than improved; followed all post op directions; taking ibuprofen 800 mg po q 8 hours without relief; no appts with DDS available over weekend. Has had influenza vacc this season. No known exposure to covid positive person.

## 2019-03-15 NOTE — Discharge Instructions (Addendum)
Continue warm salt water gargles several times daily.  Continue a mouthwash such as Listerine several times daily. May continue Ibuprofen 800mg  every 8 hours with food.  If symptoms become significantly worse during the night or over the weekend, proceed to the local emergency room.

## 2019-03-19 ENCOUNTER — Emergency Department (HOSPITAL_BASED_OUTPATIENT_CLINIC_OR_DEPARTMENT_OTHER)
Admission: EM | Admit: 2019-03-19 | Discharge: 2019-03-19 | Disposition: A | Discharge status: Home / Self Care | Payer: 59 | Attending: Emergency Medicine | Admitting: Emergency Medicine

## 2019-03-19 ENCOUNTER — Emergency Department (HOSPITAL_BASED_OUTPATIENT_CLINIC_OR_DEPARTMENT_OTHER): Payer: 59

## 2019-03-19 ENCOUNTER — Other Ambulatory Visit: Payer: Self-pay

## 2019-03-19 ENCOUNTER — Encounter (HOSPITAL_BASED_OUTPATIENT_CLINIC_OR_DEPARTMENT_OTHER): Payer: Self-pay | Admitting: *Deleted

## 2019-03-19 DIAGNOSIS — Z79899 Other long term (current) drug therapy: Secondary | ICD-10-CM | POA: Insufficient documentation

## 2019-03-19 DIAGNOSIS — K0889 Other specified disorders of teeth and supporting structures: Secondary | ICD-10-CM | POA: Diagnosis present

## 2019-03-19 DIAGNOSIS — M273 Alveolitis of jaws: Secondary | ICD-10-CM | POA: Diagnosis not present

## 2019-03-19 DIAGNOSIS — I10 Essential (primary) hypertension: Secondary | ICD-10-CM | POA: Diagnosis not present

## 2019-03-19 LAB — BASIC METABOLIC PANEL
Anion gap: 8 (ref 5–15)
BUN: 19 mg/dL (ref 6–20)
CO2: 28 mmol/L (ref 22–32)
Calcium: 9.1 mg/dL (ref 8.9–10.3)
Chloride: 106 mmol/L (ref 98–111)
Creatinine, Ser: 0.97 mg/dL (ref 0.61–1.24)
GFR calc Af Amer: >60 mL/min (ref >60)
GFR calc non Af Amer: >60 mL/min (ref >60)
Glucose, Bld: 90 mg/dL (ref 70–99)
Potassium: 4.2 mmol/L (ref 3.5–5.1)
Sodium: 142 mmol/L (ref 135–145)

## 2019-03-19 LAB — CBC WITH DIFFERENTIAL/PLATELET
Abs Immature Granulocytes: 0.01 10*3/uL (ref 0.00–0.07)
Basophils Absolute: 0 10*3/uL (ref 0.0–0.1)
Basophils Relative: 1 %
Eosinophils Absolute: 0.1 10*3/uL (ref 0.0–0.5)
Eosinophils Relative: 2 %
HCT: 46.7 % (ref 39.0–52.0)
Hemoglobin: 15 g/dL (ref 13.0–17.0)
Immature Granulocytes: 0 %
Lymphocytes Relative: 45 %
Lymphs Abs: 2.7 10*3/uL (ref 0.7–4.0)
MCH: 28.2 pg (ref 26.0–34.0)
MCHC: 32.1 g/dL (ref 30.0–36.0)
MCV: 87.9 fL (ref 80.0–100.0)
Monocytes Absolute: 0.4 10*3/uL (ref 0.1–1.0)
Monocytes Relative: 7 %
Neutro Abs: 2.7 10*3/uL (ref 1.7–7.7)
Neutrophils Relative %: 45 %
Platelets: 267 10*3/uL (ref 150–400)
RBC: 5.31 MIL/uL (ref 4.22–5.81)
RDW: 13.6 % (ref 11.5–15.5)
WBC: 6 10*3/uL (ref 4.0–10.5)
nRBC: 0 % (ref 0.0–0.2)

## 2019-03-19 MED ORDER — NAPROXEN 500 MG PO TBEC: 500.0000 mg | DELAYED_RELEASE_TABLET | Freq: Two times a day (BID) | ORAL | 0 refills | Status: DC

## 2019-03-19 MED ORDER — IOHEXOL 300 MG/ML  SOLN
100.0000 mL | Freq: Once | INTRAMUSCULAR | Status: AC | PRN
  Administered 2019-03-19: 75 mL via INTRAVENOUS

## 2019-03-19 MED ORDER — MORPHINE SULFATE (PF) 4 MG/ML IV SOLN
4.0000 mg | Freq: Once | INTRAVENOUS | Status: AC
  Administered 2019-03-19: 4 mg via INTRAVENOUS
  Filled 2019-03-19: qty 1

## 2019-03-19 MED ORDER — SODIUM CHLORIDE 0.9 % IV BOLUS
1000.0000 mL | Freq: Once | INTRAVENOUS | Status: AC
  Administered 2019-03-19: 1000 mL via INTRAVENOUS

## 2019-03-19 MED ORDER — LIDOCAINE VISCOUS HCL 2 % MT SOLN: 15.0000 mL | OROMUCOSAL | 0 refills | Status: DC | PRN

## 2019-03-19 MED ORDER — ONDANSETRON HCL 4 MG/2ML IJ SOLN
4.0000 mg | Freq: Once | INTRAMUSCULAR | Status: AC
  Administered 2019-03-19: 4 mg via INTRAVENOUS
  Filled 2019-03-19: qty 2

## 2019-03-19 MED ORDER — HYDROCODONE-ACETAMINOPHEN 5-325 MG PO TABS: 1.0000 | ORAL_TABLET | Freq: Four times a day (QID) | ORAL | 0 refills | Status: DC | PRN

## 2019-03-19 MED ORDER — CLINDAMYCIN PHOSPHATE 600 MG/50ML IV SOLN
600.0000 mg | Freq: Once | INTRAVENOUS | Status: AC
  Administered 2019-03-19: 600 mg via INTRAVENOUS
  Filled 2019-03-19: qty 50

## 2019-03-19 MED FILL — NAPROXEN 500 MG TABS: 500 | 15 days supply | Qty: 30 | Fill #0

## 2019-03-19 MED FILL — LIDOCAINE 2% VISCOUS SOLN: 2 | 7 days supply | Qty: 100 | Fill #0

## 2019-03-19 MED FILL — HYDROCODON-APAP 5-325: 5-325 | 3 days supply | Qty: 6 | Fill #0

## 2019-03-19 NOTE — ED Notes (Signed)
Pt ambulated to bathroom without difficulty.

## 2019-03-19 NOTE — ED Notes (Signed)
Pt teaching provided on medications that may cause drowsiness. Pt instructed not to drive or operate heavy machinery while taking the prescribed medication. Pt verbalized understanding.   

## 2019-03-19 NOTE — ED Notes (Signed)
Pt transported to radiology.

## 2019-03-19 NOTE — ED Triage Notes (Signed)
Dental extraction on 2/23 to his right lower tooth.  After 3 days post extraction, patient can not open his mouth and he think that his jaw is broken.  He is on Amoxicillin and Hydrocodone with no relief.  He c/o headache and dizziness.

## 2019-03-19 NOTE — Discharge Instructions (Signed)
Please take all of your antibiotics until finished!   You may develop abdominal discomfort or diarrhea from the antibiotic.  You may help offset this with probiotics which you can buy or get in yogurt. Do not eat  or take the probiotics until 2 hours after your antibiotic.   Apply warm or cool compresses to jaw throughout the day, whichever feels best.  Start taking naproxen twice daily with meals.  Do not take ibuprofen, Advil, Aleve, or Motrin while taking this medication.  You can however take [phone] mg of Tylenol every 6 hours as needed for pain. Do not exceed 4000 mg of Tylenol daily.  You can also take hydrocodone as needed for severe breakthrough pain but do not drive, drink alcohol, or operate heavy machinery while taking this medicine as it can cause drowsiness.  Be aware this medicine also contains Tylenol.    You may also use warm water salt gargles, Orajel, or other over-the-counter dental pain remedies. Use lidocaine swish and spit (do not swallow) as needed for dental pain.   Followup with a dentist is very important for ongoing evaluation and management of recurrent dental pain.  Call your dentist today to schedule follow-up appointment in the next few days.  Return to emergency department for emergent changing or worsening symptoms such as fever, worsening facial swelling, difficulty breathing or swallowing, throat tightness, or vision changes.
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
## 2019-05-21 ENCOUNTER — Emergency Department (HOSPITAL_BASED_OUTPATIENT_CLINIC_OR_DEPARTMENT_OTHER): Payer: 59

## 2019-05-21 ENCOUNTER — Emergency Department (HOSPITAL_BASED_OUTPATIENT_CLINIC_OR_DEPARTMENT_OTHER)
Admission: EM | Admit: 2019-05-21 | Discharge: 2019-05-21 | Disposition: A | Discharge status: Home / Self Care | Payer: 59 | Attending: Emergency Medicine | Admitting: Emergency Medicine

## 2019-05-21 ENCOUNTER — Other Ambulatory Visit: Payer: Self-pay

## 2019-05-21 ENCOUNTER — Encounter (HOSPITAL_BASED_OUTPATIENT_CLINIC_OR_DEPARTMENT_OTHER): Payer: Self-pay

## 2019-05-21 DIAGNOSIS — Y999 Unspecified external cause status: Secondary | ICD-10-CM | POA: Diagnosis not present

## 2019-05-21 DIAGNOSIS — M25512 Pain in left shoulder: Secondary | ICD-10-CM | POA: Insufficient documentation

## 2019-05-21 DIAGNOSIS — I10 Essential (primary) hypertension: Secondary | ICD-10-CM | POA: Insufficient documentation

## 2019-05-21 DIAGNOSIS — Z79899 Other long term (current) drug therapy: Secondary | ICD-10-CM | POA: Insufficient documentation

## 2019-05-21 DIAGNOSIS — M542 Cervicalgia: Secondary | ICD-10-CM | POA: Diagnosis present

## 2019-05-21 DIAGNOSIS — T148XXA Other injury of unspecified body region, initial encounter: Secondary | ICD-10-CM | POA: Insufficient documentation

## 2019-05-21 DIAGNOSIS — Y92008 Other place in unspecified non-institutional (private) residence as the place of occurrence of the external cause: Secondary | ICD-10-CM | POA: Insufficient documentation

## 2019-05-21 DIAGNOSIS — Y939 Activity, unspecified: Secondary | ICD-10-CM | POA: Diagnosis not present

## 2019-05-21 MED ORDER — IBUPROFEN 400 MG PO TABS
600.0000 mg | ORAL_TABLET | Freq: Once | ORAL | Status: AC
  Administered 2019-05-21: 600 mg via ORAL
  Filled 2019-05-21: qty 1

## 2019-05-21 MED ORDER — DEXAMETHASONE 6 MG PO TABS
12.0000 mg | ORAL_TABLET | Freq: Once | ORAL | Status: AC
  Administered 2019-05-21: 21:00:00 12 mg via ORAL
  Filled 2019-05-21: qty 2

## 2019-05-21 MED ORDER — HYDROCODONE-ACETAMINOPHEN 5-325 MG PO TABS
1.0000 | ORAL_TABLET | Freq: Once | ORAL | Status: AC
  Administered 2019-05-21: 21:00:00 1 via ORAL
  Filled 2019-05-21: qty 1
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
## 2019-06-27 ENCOUNTER — Other Ambulatory Visit: Payer: Self-pay

## 2019-06-27 ENCOUNTER — Encounter (HOSPITAL_BASED_OUTPATIENT_CLINIC_OR_DEPARTMENT_OTHER): Payer: Self-pay

## 2019-06-27 ENCOUNTER — Emergency Department (HOSPITAL_BASED_OUTPATIENT_CLINIC_OR_DEPARTMENT_OTHER)
Admission: EM | Admit: 2019-06-27 | Discharge: 2019-06-27 | Disposition: A | Discharge status: Home / Self Care | Payer: 59 | Attending: Emergency Medicine | Admitting: Emergency Medicine

## 2019-06-27 DIAGNOSIS — M79602 Pain in left arm: Secondary | ICD-10-CM | POA: Diagnosis not present

## 2019-06-27 DIAGNOSIS — Y999 Unspecified external cause status: Secondary | ICD-10-CM | POA: Insufficient documentation

## 2019-06-27 DIAGNOSIS — M25512 Pain in left shoulder: Secondary | ICD-10-CM | POA: Insufficient documentation

## 2019-06-27 DIAGNOSIS — Z79899 Other long term (current) drug therapy: Secondary | ICD-10-CM | POA: Insufficient documentation

## 2019-06-27 DIAGNOSIS — M791 Myalgia, unspecified site: Secondary | ICD-10-CM | POA: Diagnosis not present

## 2019-06-27 DIAGNOSIS — S46811A Strain of other muscles, fascia and tendons at shoulder and upper arm level, right arm, initial encounter: Secondary | ICD-10-CM | POA: Insufficient documentation

## 2019-06-27 DIAGNOSIS — I10 Essential (primary) hypertension: Secondary | ICD-10-CM | POA: Insufficient documentation

## 2019-06-27 DIAGNOSIS — R519 Headache, unspecified: Secondary | ICD-10-CM | POA: Diagnosis not present

## 2019-06-27 DIAGNOSIS — Y9289 Other specified places as the place of occurrence of the external cause: Secondary | ICD-10-CM | POA: Insufficient documentation

## 2019-06-27 DIAGNOSIS — Y939 Activity, unspecified: Secondary | ICD-10-CM | POA: Insufficient documentation

## 2019-06-27 DIAGNOSIS — Z041 Encounter for examination and observation following transport accident: Secondary | ICD-10-CM | POA: Insufficient documentation

## 2019-06-27 DIAGNOSIS — S46812A Strain of other muscles, fascia and tendons at shoulder and upper arm level, left arm, initial encounter: Secondary | ICD-10-CM

## 2019-06-27 MED ORDER — KETOROLAC TROMETHAMINE 60 MG/2ML IM SOLN
15.0000 mg | Freq: Once | INTRAMUSCULAR | Status: AC
  Administered 2019-06-27: 15 mg via INTRAMUSCULAR
  Filled 2019-06-27: qty 2

## 2019-06-27 MED ORDER — ACETAMINOPHEN 500 MG PO TABS
1000.0000 mg | ORAL_TABLET | Freq: Once | ORAL | Status: AC
  Administered 2019-06-27: 1000 mg via ORAL
  Filled 2019-06-27: qty 2
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
## 2019-06-27 NOTE — Discharge Instructions (Signed)
Take 4 over the counter ibuprofen tablets 3 times a day or 2 over-the-counter naproxen tablets twice a day for pain. Also take tylenol 1000mg(2 extra strength) four times a day.    

## 2019-08-15 ENCOUNTER — Observation Stay (HOSPITAL_BASED_OUTPATIENT_CLINIC_OR_DEPARTMENT_OTHER)
Admission: EM | Admit: 2019-08-15 | Discharge: 2019-08-17 | Disposition: A | Discharge status: Home / Self Care | Payer: 59 | Attending: Internal Medicine | Admitting: Internal Medicine

## 2019-08-15 ENCOUNTER — Other Ambulatory Visit: Payer: Self-pay

## 2019-08-15 ENCOUNTER — Emergency Department (HOSPITAL_BASED_OUTPATIENT_CLINIC_OR_DEPARTMENT_OTHER): Payer: 59

## 2019-08-15 ENCOUNTER — Encounter (HOSPITAL_BASED_OUTPATIENT_CLINIC_OR_DEPARTMENT_OTHER): Payer: Self-pay

## 2019-08-15 DIAGNOSIS — R414 Neurologic neglect syndrome: Secondary | ICD-10-CM | POA: Diagnosis not present

## 2019-08-15 DIAGNOSIS — R202 Paresthesia of skin: Secondary | ICD-10-CM

## 2019-08-15 DIAGNOSIS — Z79899 Other long term (current) drug therapy: Secondary | ICD-10-CM | POA: Insufficient documentation

## 2019-08-15 DIAGNOSIS — I10 Essential (primary) hypertension: Secondary | ICD-10-CM | POA: Diagnosis not present

## 2019-08-15 DIAGNOSIS — R0989 Other specified symptoms and signs involving the circulatory and respiratory systems: Secondary | ICD-10-CM

## 2019-08-15 DIAGNOSIS — R2990 Unspecified symptoms and signs involving the nervous system: Secondary | ICD-10-CM

## 2019-08-15 DIAGNOSIS — Z20822 Contact with and (suspected) exposure to covid-19: Secondary | ICD-10-CM | POA: Diagnosis not present

## 2019-08-15 DIAGNOSIS — F419 Anxiety disorder, unspecified: Secondary | ICD-10-CM | POA: Diagnosis not present

## 2019-08-15 DIAGNOSIS — R791 Abnormal coagulation profile: Secondary | ICD-10-CM | POA: Insufficient documentation

## 2019-08-15 DIAGNOSIS — R531 Weakness: Secondary | ICD-10-CM | POA: Diagnosis not present

## 2019-08-15 DIAGNOSIS — M545 Low back pain: Secondary | ICD-10-CM | POA: Insufficient documentation

## 2019-08-15 DIAGNOSIS — M109 Gout, unspecified: Secondary | ICD-10-CM

## 2019-08-15 DIAGNOSIS — I639 Cerebral infarction, unspecified: Secondary | ICD-10-CM | POA: Insufficient documentation

## 2019-08-15 LAB — RAPID URINE DRUG SCREEN, HOSP PERFORMED
Amphetamines: NOT DETECTED
Barbiturates: NOT DETECTED
Benzodiazepines: NOT DETECTED
Cocaine: NOT DETECTED
Opiates: NOT DETECTED
Tetrahydrocannabinol: NOT DETECTED

## 2019-08-15 LAB — COMPREHENSIVE METABOLIC PANEL
ALT: 35 U/L (ref 0–44)
AST: 29 U/L (ref 15–41)
Albumin: 4.6 g/dL (ref 3.5–5.0)
Alkaline Phosphatase: 68 U/L (ref 38–126)
Anion gap: 12 (ref 5–15)
BUN: 16 mg/dL (ref 6–20)
CO2: 28 mmol/L (ref 22–32)
Calcium: 9.1 mg/dL (ref 8.9–10.3)
Chloride: 102 mmol/L (ref 98–111)
Creatinine, Ser: 1 mg/dL (ref 0.61–1.24)
GFR calc Af Amer: >60 mL/min (ref >60)
GFR calc non Af Amer: >60 mL/min (ref >60)
Glucose, Bld: 93 mg/dL (ref 70–99)
Potassium: 3.4 mmol/L — ABNORMAL LOW (ref 3.5–5.1)
Sodium: 142 mmol/L (ref 135–145)
Total Bilirubin: 0.7 mg/dL (ref 0.3–1.2)
Total Protein: 8.1 g/dL (ref 6.5–8.1)

## 2019-08-15 LAB — CBC
HCT: 48.2 % (ref 39.0–52.0)
Hemoglobin: 15.5 g/dL (ref 13.0–17.0)
MCH: 28.1 pg (ref 26.0–34.0)
MCHC: 32.2 g/dL (ref 30.0–36.0)
MCV: 87.5 fL (ref 80.0–100.0)
Platelets: 243 10*3/uL (ref 150–400)
RBC: 5.51 MIL/uL (ref 4.22–5.81)
RDW: 13.2 % (ref 11.5–15.5)
WBC: 7.6 10*3/uL (ref 4.0–10.5)
nRBC: 0 % (ref 0.0–0.2)

## 2019-08-15 LAB — URINALYSIS, ROUTINE W REFLEX MICROSCOPIC
Bilirubin Urine: NEGATIVE
Glucose, UA: NEGATIVE mg/dL
Hgb urine dipstick: NEGATIVE
Ketones, ur: NEGATIVE mg/dL
Leukocytes,Ua: NEGATIVE
Nitrite: NEGATIVE
Protein, ur: NEGATIVE mg/dL
Specific Gravity, Urine: >1.03 — ABNORMAL HIGH (ref 1.005–1.030)
pH: 6 (ref 5.0–8.0)

## 2019-08-15 LAB — DIFFERENTIAL
Abs Immature Granulocytes: 0.03 10*3/uL (ref 0.00–0.07)
Basophils Absolute: 0 10*3/uL (ref 0.0–0.1)
Basophils Relative: 1 %
Eosinophils Absolute: 0.1 10*3/uL (ref 0.0–0.5)
Eosinophils Relative: 1 %
Immature Granulocytes: 0 %
Lymphocytes Relative: 41 %
Lymphs Abs: 3.1 10*3/uL (ref 0.7–4.0)
Monocytes Absolute: 0.5 10*3/uL (ref 0.1–1.0)
Monocytes Relative: 6 %
Neutro Abs: 3.9 10*3/uL (ref 1.7–7.7)
Neutrophils Relative %: 51 %

## 2019-08-15 LAB — PROTIME-INR
INR: 1 (ref 0.8–1.2)
Prothrombin Time: 12.3 seconds (ref 11.4–15.2)

## 2019-08-15 LAB — ETHANOL: Alcohol, Ethyl (B): <10 mg/dL (ref <10)

## 2019-08-15 LAB — SARS CORONAVIRUS 2 BY RT PCR (HOSPITAL ORDER, PERFORMED IN ~~LOC~~ HOSPITAL LAB): SARS Coronavirus 2: NEGATIVE

## 2019-08-15 LAB — APTT: aPTT: 26 seconds (ref 24–36)

## 2019-08-15 MED ORDER — SODIUM CHLORIDE 0.9 % IV SOLN
100.0000 mL/h | INTRAVENOUS | Status: DC
  Administered 2019-08-15 – 2019-08-17 (×3): 100 mL/h via INTRAVENOUS

## 2019-08-15 MED ORDER — SODIUM CHLORIDE 0.9 % IV BOLUS
500.0000 mL | Freq: Once | INTRAVENOUS | Status: AC
  Administered 2019-08-15: 500 mL via INTRAVENOUS

## 2019-08-15 MED ORDER — ASPIRIN 81 MG PO CHEW
324.0000 mg | CHEWABLE_TABLET | Freq: Once | ORAL | Status: AC
  Administered 2019-08-15: 324 mg via ORAL
  Filled 2019-08-15: qty 4

## 2019-08-15 NOTE — ED Provider Notes (Signed)
[facility] EMERGENCY DEPARTMENT Provider Note   CSN: [id number] Arrival date & time: 08/15/19  1719     History Chief Complaint  Patient presents with  . Numbness    Randy Rose is a 54 y.o. male with a past medical history of depression, hypertension, who presents today for evaluation of decreased sensation.  He reports that he went to bed last night at 930 and was normal.  Since waking up this morning he initially felt like he had it was weak in his right foot and unable to lay it flat with subjective decreased sensation.  He reports that this has been worsening and spreading up his leg throughout the day and is now also including his right arm.  He denies any recent trauma or medication changes.  He states that through the day he has become progressively weaker in the right and leg arm.  He denies any slurred speech.  He reports mild lower back pain and right-sided headache.  He denies any fevers.  No alleviating symptoms noted.  He denies any visual changes.  HPI     Past Medical History:  Diagnosis Date  . Depression   . Hypertension     Patient Active Problem List   Diagnosis Date Noted  . Stroke [facility]) 08/15/2019    History reviewed. No pertinent surgical history.     Family History  Problem Relation Age of Onset  . Hypertension Mother   . Hyperlipidemia Mother   . Hyperlipidemia Father   . Hypertension Father     Social History   Tobacco Use  . Smoking status: Never Smoker  . Smokeless tobacco: Never Used  Vaping Use  . Vaping Use: Never used  Substance Use Topics  . Alcohol use: No  . Drug use: No    Home Medications Prior to Admission medications   Medication Sig Start Date End Date Taking? Authorizing Provider  albuterol (VENTOLIN HFA) 108 (90 Base) MCG/ACT inhaler  06/09/19   [provider]  hydrOXYzine (VISTARIL) 50 MG capsule Take 50 mg by mouth 2 (two) times daily as needed. 05/23/19   [provider]  ibuprofen  (ADVIL) 600 MG tablet Take 600 mg by mouth 3 (three) times daily. 06/09/19   [provider]  lisinopril (PRINIVIL,ZESTRIL) 20 MG tablet Take 20 mg by mouth daily.    [provider]  lisinopril-hydrochlorothiazide (ZESTORETIC) 20-12.5 MG tablet Take 2 tablets by mouth daily. 05/14/19   [provider]  valACYclovir (VALTREX) 1000 MG tablet Take 1,000 mg by mouth 2 (two) times daily. 06/09/19   [provider]  vortioxetine HBr (TRINTELLIX) 10 MG TABS tablet Take 10 mg by mouth daily.    [provider]    Allergies    Patient has no known allergies.  Review of Systems   Review of Systems  Constitutional: Negative for chills, fatigue and fever.  Respiratory: Negative for cough and shortness of breath.   Cardiovascular: Positive for palpitations. Negative for chest pain.  Gastrointestinal: Negative for abdominal pain, diarrhea, nausea and vomiting.  Musculoskeletal: Positive for back pain.  Neurological: Positive for weakness, numbness and headaches. Negative for facial asymmetry and speech difficulty.  All other systems reviewed and are negative.   Physical Exam Updated Vital Signs BP (!) 133/82   Pulse 63   Temp 98.8 F (37.1 C) (Oral)   Resp 15   Ht 6\' 1"  (1.854 m)   Wt (!) 117 kg   SpO2 100%   BMI 34.03 kg/m  Physical Exam Vitals and nursing note reviewed.  Constitutional:      Appearance: He is well-developed. He is not ill-appearing.  HENT:     Head: Normocephalic and atraumatic.  Eyes:     Conjunctiva/sclera: Conjunctivae normal.  Cardiovascular:     Rate and Rhythm: Normal rate and regular rhythm.     Pulses: Normal pulses.     Heart sounds: Normal heart sounds. No murmur heard.   Pulmonary:     Effort: Pulmonary effort is normal. No respiratory distress.     Breath sounds: Normal breath sounds.  Abdominal:     General: There is no distension.     Palpations: Abdomen is soft.     Tenderness: There is no abdominal  tenderness. There is no guarding.  Musculoskeletal:     Cervical back: Neck supple.  Skin:    General: Skin is warm and dry.  Neurological:     Mental Status: He is alert.     Comments: Mental Status:  Alert, oriented, thought content appropriate, able to give a coherent history. Speech fluent without evidence of aphasia. Able to follow 2 step commands without difficulty.  Cranial Nerves:  II:  Peripheral visual fields grossly normal, pupils equal, round, reactive to light III,IV, VI: ptosis not present, extra-ocular motions intact bilaterally  V,VII: smile symmetric, Subjective decreased sensation to light touch on entire right side of head VIII: Right ear with subjective decreased hearing, left ear normal.  X: uvula elevates symmetrically  XI: Decreased strength right shoulder shrug when compared to left.  XII: midline tongue extension without fassiculations Motor:  Normal tone. Global 4/5 strength in entire right side upper and lower extremities.  5/5 strength left side.  Cerebellar: normal finger-to-nose with bilateral upper extremities CV: distal pulses palpable throughout  Bilateral extremities, abdomen, chest, neck, head and face all have subjective decreased sensation to light touch on the right side through entire body.  Left side is normal through entire body.   Psychiatric:        Mood and Affect: Mood normal.        Behavior: Behavior normal.     ED Results / Procedures / Treatments   Labs (all labs ordered are listed, but only abnormal results are displayed) Labs Reviewed  COMPREHENSIVE METABOLIC PANEL - Abnormal; Notable for the following components:      Result Value   Potassium 3.4 (*)    All other components within normal limits  URINALYSIS, ROUTINE W REFLEX MICROSCOPIC - Abnormal; Notable for the following components:   Specific Gravity, Urine >1.030 (*)    All other components within normal limits  SARS CORONAVIRUS 2 BY RT PCR (HOSPITAL ORDER, PERFORMED IN   HOSPITAL LAB)  ETHANOL  PROTIME-INR  APTT  CBC  DIFFERENTIAL  RAPID URINE DRUG SCREEN, HOSP PERFORMED    EKG EKG Interpretation  Date/Time:  Friday August 15 2019 18:52:10 EDT Ventricular Rate:  80 PR Interval:  148 QRS Duration: 97 QT Interval:  362 QTC Calculation: 418 R Axis:   38 Text Interpretation: Sinus rhythm Consider left atrial enlargement RSR' in V1 or V2, right VCD or RVH rate is slower compared to earlier in the day Confirmed by Pricilla LovelessGoldston, Scott [phone]([id number]) on 08/15/2019 9:11:37 PM   Radiology DG Chest 2 View  Result Date: 08/15/2019 CLINICAL DATA:  Weakness RIGHT-sided upper and lower extremity weakness decreased mobility since 05/30 EXAM: CHEST - 2 VIEW COMPARISON:  CT of 11/14/2018 FINDINGS: Trachea is midline. Cardiomediastinal contours and hilar structures are  normal. Lungs are clear. No sign of pleural effusion. Visualized skeletal structures on limited assessment are unremarkable. IMPRESSION: No acute cardiopulmonary disease. Electronically Signed   By: Donzetta Kohut M.D.   On: 08/15/2019 19:08   CT HEAD WO CONTRAST  Result Date: 08/15/2019 CLINICAL DATA:  Neuro deficit, acute, stroke suspected Right-sided upper and lower extremity weakness. EXAM: CT HEAD WITHOUT CONTRAST TECHNIQUE: Contiguous axial images were obtained from the base of the skull through the vertex without intravenous contrast. COMPARISON:  Head CT 09/29/2017 FINDINGS: Brain: No intracranial hemorrhage, mass effect, or midline shift. No hydrocephalus. The basilar cisterns are patent. No evidence of territorial infarct or acute ischemia. No extra-axial or intracranial fluid collection. Vascular: No hyperdense vessel. Skull: No fracture or focal lesion. Sinuses/Orbits: Paranasal sinuses and mastoid air cells are clear. The visualized orbits are unremarkable. Other: None. IMPRESSION: Negative noncontrast head CT. Electronically Signed   By: Narda Rutherford M.D.   On: 08/15/2019 19:11     Procedures .Critical Care Performed by: Cristina Gong, PA-C Authorized by: Cristina Gong, PA-C   Critical care provider statement:    Critical care time (minutes):  45   Critical care time was exclusive of:  Separately billable procedures and treating other patients and teaching time   Critical care was necessary to treat or prevent imminent or life-threatening deterioration of the following conditions:  CNS failure or compromise   Critical care was time spent personally by me on the following activities:  Discussions with consultants, evaluation of patient's response to treatment, examination of patient, ordering and performing treatments and interventions, ordering and review of laboratory studies, ordering and review of radiographic studies, pulse oximetry, re-evaluation of patient's condition, obtaining history from patient or surrogate and review of old charts   (including critical care time)  Medications Ordered in ED Medications  sodium chloride 0.9 % bolus 500 mL (0 mLs Intravenous Stopped 08/15/19 2033)    Followed by  0.9 %  sodium chloride infusion (100 mL/hr Intravenous New Bag/Given 08/15/19 1858)  aspirin chewable tablet 324 mg (324 mg Oral Given 08/15/19 2229)    ED Course  I have reviewed the triage vital signs and the nursing notes.  Pertinent labs & imaging results that were available during my care of the patient were reviewed by me and considered in my medical decision making (see chart for details).  Clinical Course as of Aug 15 49  Fri Aug 15, 2019  2135 I spoke with telemetry neurologist, they suspect a small stroke.  Recommended admission for stroke work-up and MRI.   [EH]    Clinical Course User Index [EH] Norman Clay   MDM Rules/Calculators/A&P                         Patient is a 54 year old man who presents today for evaluation of decreased sensation to light touch and weakness on the entire right side of his body which  he reports has been worsening since he woke up this morning.    On my exam he has subjective decreased sensation to light touch to the entire right side of his body including the right side of his face and head, decreased reported hearing out of the right ear along with decreased strength in the right arm and leg.    CT head was obtained without acute stroke or other abnormalities.  CMP is unremarkable.  UA shows dehydration.  CBC is unremarkable.  Chest x-ray is reassuring,  imaging is reviewed specifically in 10/2018 he had a CTA PE study without evidence of aneurysmal dilation.  Based on the diffuse nature of his symptoms including his upper extremities and head/neck I doubt a AAA as it would not explain all of his symptoms.  Telemetry neurology is consulted, they suspect he may have had a small stroke and recommend admission, starting patient on aspirin and a MRI.  Dr. Gwenlyn Fudge spoke with hospitalist who will see patient for admission.  Patient was outside any TPA window is, and did not meet criteria for LVO.  Note: Portions of this report may have been transcribed using voice recognition software. Every effort was made to ensure accuracy; however, inadvertent computerized transcription errors may be present   Final Clinical Impression(s) / ED Diagnoses Final diagnoses:  Suspected stroke patient last known to be well more than 2 hours ago  Paresthesia  Weakness    Rx / DC Orders ED Discharge Orders    None       Cristina Gong, PA-C 08/16/19 0054    Pricilla Loveless, MD 08/16/19 1620

## 2019-08-15 NOTE — ED Notes (Signed)
Teleneuro cart taken to room.

## 2019-08-15 NOTE — Consult Note (Signed)
[facility] Tele[facility]urology Consult Services  Stat Consult  Date of Service:   08/15/2019 20:39:46  Impression:     .  I63.9 - Cerebrovascular accident (CVA), unspecified mechanism (HCC)  Comments/Sign-Out: 54 year old male with a history of hypertension presents with right-sided sensory disturbance since waking up this morning. Outside the window for IV thrombolytics. Symptoms are consistent with a small ischemic stroke, left hemisphere. Would recommend admission for stroke work-up. Initiate aspirin in the emergency department.  CT HEAD: Showed No Acute Hemorrhage or Acute Core Infarct  Metrics: [facility] Notification Time: 08/15/2019 20:37:59 Stamp Time: 08/15/2019 20:39:46 Callback Response Time: 08/15/2019 20:38:00  Our recommendations are outlined below.  Recommendations:     Marland Kitchen  MRI brain without IV contrast     .  MRA head/neck     .  TSH, A1c, lipid profile     .  Transthoracic echocardiogram     .  Continuous telemetry     .  Physical, occupational, and speech therapies     .  q4h neuro checks/NIHSS     .  NPO until bedside swallow     .  Neurology follow-up   ----------------------------------------------------------------------------------------------------  Chief Complaint: Right sided numbness  History of Present Illness: Patient is a 54 year old Male.  54 year old male with a history of hypertension presents to emergency room with right-sided sensory disturbance. He went to bed last night feeling well. This morning, he woke up at around 530 with symptoms of right-sided numbness and sensory disturbance involving the right side of the face, arm, and leg. Symptoms persisted throughout the day and then worsened, therefore came to the emergency department. CT head without contrast showed no clear acute findings. He feels like his symptoms are stable. He also feels like the right side of his body just "is not working right" when compared to  the left. His NIH stroke scale is a 1 for sensory disturbance but he has no drift in the upper and lower extremities currently. However, he does appear to have some more limited range of motion especially in the right lower extremity with dorsiflexion plantarflexion when compared to the left.   Past Medical History:     . Hypertension      Examination: BP(142/93), Pulse(78), Blood Glucose(93) 1A: Level of Consciousness - Alert; keenly responsive + 0 1B: Ask Month and Age - Both Questions Right + 0 1C: Blink Eyes & Squeeze Hands - Performs Both Tasks + 0 2: Test Horizontal Extraocular Movements - Normal + 0 3: Test Visual Fields - No Visual Loss + 0 4: Test Facial Palsy (Use Grimace if Obtunded) - Normal symmetry + 0 5A: Test Left Arm Motor Drift - No Drift for 10 Seconds + 0 5B: Test Right Arm Motor Drift - No Drift for 10 Seconds + 0 6A: Test Left Leg Motor Drift - No Drift for 5 Seconds + 0 6B: Test Right Leg Motor Drift - No Drift for 5 Seconds + 0 7: Test Limb Ataxia (FNF/Heel-Shin) - No Ataxia + 0 8: Test Sensation - Mild-Moderate Loss: Less Sharp/More Dull + 1 9: Test Language/Aphasia - Normal; No aphasia + 0 10: Test Dysarthria - Normal + 0 11: Test Extinction/Inattention - No abnormality + 0  NIHSS Score: 1    Patient is being evaluated for possible acute neurologic impairment and high probability of imminent or life-threatening deterioration. I spent total of 35 minutes providing care to this patient, including time for face to face visit via telemedicine, review of  medical records, imaging studies and discussion of findings with providers, the patient and/or family.   Dr Lacie Scotts   [facility] [phone]  Case [id number]

## 2019-08-15 NOTE — ED Notes (Signed)
Off unit - radiology department

## 2019-08-15 NOTE — ED Triage Notes (Signed)
Pt states he woke this am ~530am with numbness right LE and UE with decreased mobility to LE and UE-denies injury

## 2019-08-16 ENCOUNTER — Observation Stay (HOSPITAL_COMMUNITY): Payer: 59

## 2019-08-16 ENCOUNTER — Observation Stay (HOSPITAL_BASED_OUTPATIENT_CLINIC_OR_DEPARTMENT_OTHER): Payer: 59

## 2019-08-16 DIAGNOSIS — R2990 Unspecified symptoms and signs involving the nervous system: Secondary | ICD-10-CM

## 2019-08-16 DIAGNOSIS — R791 Abnormal coagulation profile: Secondary | ICD-10-CM | POA: Diagnosis not present

## 2019-08-16 DIAGNOSIS — R0989 Other specified symptoms and signs involving the circulatory and respiratory systems: Secondary | ICD-10-CM | POA: Diagnosis not present

## 2019-08-16 DIAGNOSIS — R531 Weakness: Secondary | ICD-10-CM | POA: Diagnosis not present

## 2019-08-16 DIAGNOSIS — I10 Essential (primary) hypertension: Secondary | ICD-10-CM

## 2019-08-16 DIAGNOSIS — R414 Neurologic neglect syndrome: Secondary | ICD-10-CM | POA: Diagnosis not present

## 2019-08-16 DIAGNOSIS — I6389 Other cerebral infarction: Secondary | ICD-10-CM | POA: Diagnosis not present

## 2019-08-16 DIAGNOSIS — Z79899 Other long term (current) drug therapy: Secondary | ICD-10-CM | POA: Diagnosis not present

## 2019-08-16 DIAGNOSIS — M545 Low back pain: Secondary | ICD-10-CM | POA: Diagnosis not present

## 2019-08-16 DIAGNOSIS — F419 Anxiety disorder, unspecified: Secondary | ICD-10-CM | POA: Diagnosis not present

## 2019-08-16 DIAGNOSIS — R202 Paresthesia of skin: Secondary | ICD-10-CM | POA: Diagnosis present

## 2019-08-16 DIAGNOSIS — Z20822 Contact with and (suspected) exposure to covid-19: Secondary | ICD-10-CM | POA: Diagnosis not present

## 2019-08-16 LAB — CBC
HCT: 45.6 % (ref 39.0–52.0)
Hemoglobin: 14.7 g/dL (ref 13.0–17.0)
MCH: 28.5 pg (ref 26.0–34.0)
MCHC: 32.2 g/dL (ref 30.0–36.0)
MCV: 88.4 fL (ref 80.0–100.0)
Platelets: 213 10*3/uL (ref 150–400)
RBC: 5.16 MIL/uL (ref 4.22–5.81)
RDW: 13.2 % (ref 11.5–15.5)
WBC: 5.6 10*3/uL (ref 4.0–10.5)
nRBC: 0 % (ref 0.0–0.2)

## 2019-08-16 LAB — ECHOCARDIOGRAM COMPLETE
AR max vel: 3.36 cm2
AV Area VTI: 3.6 cm2
AV Area mean vel: 2.98 cm2
AV Mean grad: 3 mmHg
AV Peak grad: 5.3 mmHg
Ao pk vel: 1.15 m/s
Area-P 1/2: 3.31 cm2
Height: 73 in
S' Lateral: 3.3 cm
Weight: 4127.01 oz

## 2019-08-16 LAB — HIV ANTIBODY (ROUTINE TESTING W REFLEX): HIV Screen 4th Generation wRfx: NONREACTIVE

## 2019-08-16 LAB — CREATININE, SERUM
Creatinine, Ser: 0.9 mg/dL (ref 0.61–1.24)
GFR calc Af Amer: >60 mL/min (ref >60)
GFR calc non Af Amer: >60 mL/min (ref >60)

## 2019-08-16 MED ORDER — HYDROXYZINE HCL 50 MG PO TABS
50.0000 mg | ORAL_TABLET | Freq: Two times a day (BID) | ORAL | Status: DC | PRN
  Filled 2019-08-16: qty 1

## 2019-08-16 MED ORDER — POLYETHYLENE GLYCOL 3350 17 G PO PACK: 17.0000 g | PACK | Freq: Every day | ORAL | Status: DC | PRN

## 2019-08-16 MED ORDER — VALACYCLOVIR HCL 500 MG PO TABS
1000.0000 mg | ORAL_TABLET | Freq: Two times a day (BID) | ORAL | Status: DC
  Administered 2019-08-16 – 2019-08-17 (×2): 1000 mg via ORAL
  Filled 2019-08-16 (×2): qty 2

## 2019-08-16 MED ORDER — HYDROCODONE-ACETAMINOPHEN 5-325 MG PO TABS
1.0000 | ORAL_TABLET | Freq: Once | ORAL | Status: AC
  Administered 2019-08-16: 1 via ORAL
  Filled 2019-08-16: qty 1

## 2019-08-16 MED ORDER — SODIUM CHLORIDE 0.9% FLUSH
3.0000 mL | Freq: Two times a day (BID) | INTRAVENOUS | Status: DC
  Administered 2019-08-16 – 2019-08-17 (×3): 3 mL via INTRAVENOUS

## 2019-08-16 MED ORDER — ENOXAPARIN SODIUM 40 MG/0.4ML ~~LOC~~ SOLN
40.0000 mg | Freq: Every day | SUBCUTANEOUS | Status: DC
  Administered 2019-08-16: 40 mg via SUBCUTANEOUS
  Filled 2019-08-16 (×2): qty 0.4

## 2019-08-16 MED ORDER — LISINOPRIL 20 MG PO TABS
20.0000 mg | ORAL_TABLET | Freq: Every day | ORAL | Status: DC
  Administered 2019-08-17: 20 mg via ORAL
  Filled 2019-08-16: qty 1

## 2019-08-16 MED ORDER — LISINOPRIL-HYDROCHLOROTHIAZIDE 20-12.5 MG PO TABS: 2.0000 | ORAL_TABLET | Freq: Every day | ORAL | Status: DC

## 2019-08-16 MED ORDER — HYDROCHLOROTHIAZIDE 12.5 MG PO CAPS
12.5000 mg | ORAL_CAPSULE | Freq: Every day | ORAL | Status: DC
  Administered 2019-08-17: 12.5 mg via ORAL
  Filled 2019-08-16: qty 1

## 2019-08-16 MED ORDER — VORTIOXETINE HBR 5 MG PO TABS
10.0000 mg | ORAL_TABLET | Freq: Every day | ORAL | Status: DC
  Administered 2019-08-17: 10 mg via ORAL
  Filled 2019-08-16: qty 2

## 2019-08-16 NOTE — Consult Note (Addendum)
Neurology Consultation  Reason for Consult: Leg pain, questionable stroke  Referring Physician: Dr. Whitney Post  CC: Significant pain along the right distal metatarsals along with distal first metatarsal  History is obtained from: Patient  HPI: Randy Rose is a 54 y.o. male with a history of hypertension and depression.  Patient states that he was last known well at 8:30 PM yesterday when he went to bed.  Patient woke at 0545 today and felt as though his foot was cramping up and then slightly his thigh.  Due to the cramping in his foot not improving, and not being able to place weight on his right foot, he became very anxious.  He noted that when he put weight on his right foot his great toe would go up.  He also noticed that his right bicep cramped up at one point.  At this point in time he still has severe pain to any pressure on the right foot especially along the plantar surface of the foot in the region of the distal metatarsals, with the first metatarsal more painful than the others.  He feels as though he cannot flex his toes due to the pain. He does believe that if the pain were to go away he likely could move his foot normally.  When asked, if all his pain could be relieved, would he be able to move his toes, he stated "I do think I could move my toes."  Patient states that he does eat a lot of meat and fish and that he has been slightly dehydrated recently. He does not drink alcohol.  Of note, he does also state that there is a warm sensation, especially on that first distal metatarsal.  Past Medical History:  Diagnosis Date  . Depression   . Hypertension     Family History  Problem Relation Age of Onset  . Hypertension Mother   . Hyperlipidemia Mother   . Hyperlipidemia Father   . Hypertension Father    Social History:   reports that he has never smoked. He has never used smokeless tobacco. He reports that he does not drink alcohol and does not use  drugs.  Medications  Current Facility-Administered Medications:  .  [COMPLETED] sodium chloride 0.9 % bolus 500 mL, 500 mL, Intravenous, Once, Stopped at 08/15/19 2033 **FOLLOWED BY** 0.9 %  sodium chloride infusion, 100 mL/hr, Intravenous, Continuous, Jae Dire, MD, Last Rate: 100 mL/hr at 08/16/19 1213, 100 mL/hr at 08/16/19 1213 .  enoxaparin (LOVENOX) injection 40 mg, 40 mg, Subcutaneous, Daily, Jae Dire, MD, 40 mg at 08/16/19 1452 .  hydrOXYzine (VISTARIL) capsule 50 mg, 50 mg, Oral, BID PRN, Jae Dire, MD .  lisinopril-hydrochlorothiazide (ZESTORETIC) 20-12.5 MG per tablet 2 tablet, 2 tablet, Oral, Daily, Jae Dire, MD .  polyethylene glycol (MIRALAX / GLYCOLAX) packet 17 g, 17 g, Oral, Daily PRN, Whitney Post E, MD .  sodium chloride flush (NS) 0.9 % injection 3 mL, 3 mL, Intravenous, Q12H, Jae Dire, MD, 3 mL at 08/16/19 1452 .  valACYclovir (VALTREX) tablet 1,000 mg, 1,000 mg, Oral, BID, Jae Dire, MD .  vortioxetine HBr (TRINTELLIX) tablet 10 mg, 10 mg, Oral, Daily, Dairl Ponder, Roe Rutherford, MD  ROS:    General ROS: negative for - chills, fatigue, fever, night sweats, weight gain or weight loss Psychological ROS: negative for - behavioral disorder, hallucinations, memory difficulties, mood swings or suicidal ideation Ophthalmic ROS: negative for - blurry vision, double vision, eye pain or loss  of vision ENT ROS: negative for - epistaxis, nasal discharge, oral lesions, sore throat, tinnitus or vertigo Respiratory ROS: negative for - cough, hemoptysis, shortness of breath or wheezing Cardiovascular ROS: negative for - chest pain, dyspnea on exertion, edema or irregular heartbeat Gastrointestinal ROS: negative for - abdominal pain, diarrhea, hematemesis, nausea/vomiting or stool incontinence Genito-Urinary ROS: negative for - dysuria, hematuria, incontinence or urinary frequency/urgency Musculoskeletal ROS: Positive for -significant joint pain and warmth on the  right distal metatarsals. Neurological ROS: as noted in HPI Dermatological ROS: negative for rash and skin lesion changes  Exam: Current vital signs: BP (!) 141/87 (BP Location: Left Arm)   Pulse 70   Temp 97.7 F (36.5 C) (Oral)   Resp 18   Ht 6\' 1"  (1.854 m)   Wt (!) 117 kg   SpO2 98%   BMI 34.03 kg/m  Vital signs in last 24 hours: Temp:  [97.7 F (36.5 C)-98.8 F (37.1 C)] 97.7 F (36.5 C) (07/31 1158) Pulse Rate:  [59-103] 70 (07/31 1158) Resp:  [13-26] 18 (07/31 1158) BP: (120-154)/(67-98) 141/87 (07/31 1158) SpO2:  [95 %-100 %] 98 % (07/31 1158) Weight:  09-26-1975 kg] 117 kg (07/30 1758)   Constitutional: Appears well-developed and well-nourished.  Psych: Affect appropriate to situation Eyes: No scleral injection HENT: No OP obstrucion Head: Normocephalic.  Cardiovascular: Normal rate and regular rhythm.  Respiratory: Effort normal, non-labored breathing GI: Soft.  No distension. There is no tenderness.  Skin: WDI  Neuro: Mental Status: Patient is awake, alert, oriented to person, place, month, year, and situation.  Patient speech is clear with no dysarthria or aphasia.  Intact naming, repeating, comprehension.  Patient is able to give a good history.  Patient is able to follow commands without difficulty Cranial Nerves: II: Visual Fields are full.  III,IV, VI: EOMI without ptosis or diplopia. Pupils equal, round and reactive to light V: Facial sensation is symmetric to temperature VII: Facial movement is symmetric.  VIII: hearing is intact to voice X: Palate elevates symmetrically XI: Shoulder shrug is symmetric. XII: Tongue is midline without atrophy or fasciculations.  Motor: Tone is normal. Bulk is normal. 5/5 strength was present in all four extremities.  Sensory: Sensation is symmetric to light touch and temperature in the arms and legs.  Severe discomfort with any pressure on the distal metatarsals of his right foot Deep Tendon Reflexes: 2+ and symmetric  in the biceps and patellae.  Plantars: Toes are downgoing bilaterally. Cerebellar: FNF and HKS are intact bilaterally  Labs I have reviewed labs in epic and the results pertinent to this consultation are:   CBC    Component Value Date/Time   WBC 5.6 08/16/2019 1221   RBC 5.16 08/16/2019 1221   HGB 14.7 08/16/2019 1221   HCT 45.6 08/16/2019 1221   PLT 213 08/16/2019 1221   MCV 88.4 08/16/2019 1221   MCH 28.5 08/16/2019 1221   MCHC 32.2 08/16/2019 1221   RDW 13.2 08/16/2019 1221   LYMPHSABS 3.1 08/15/2019 1814   MONOABS 0.5 08/15/2019 1814   EOSABS 0.1 08/15/2019 1814   BASOSABS 0.0 08/15/2019 1814    CMP     Component Value Date/Time   NA 142 08/15/2019 1814   K 3.4 (L) 08/15/2019 1814   CL 102 08/15/2019 1814   CO2 28 08/15/2019 1814   GLUCOSE 93 08/15/2019 1814   BUN 16 08/15/2019 1814   CREATININE 0.90 08/16/2019 1221   CALCIUM 9.1 08/15/2019 1814   PROT 8.1 08/15/2019 1814  ALBUMIN 4.6 08/15/2019 1814   AST 29 08/15/2019 1814   ALT 35 08/15/2019 1814   ALKPHOS 68 08/15/2019 1814   BILITOT 0.7 08/15/2019 1814   GFRNONAA >60 08/16/2019 1221   GFRAA >60 08/16/2019 1221    Lipid Panel  No results found for: CHOL, TRIG, HDL, CHOLHDL, VLDL, LDLCALC, LDLDIRECT   Imaging I have reviewed the images obtained:  CT-scan of the brain: Negative CT noncontrast of head  MRI examination of the brain- No acute or subacute infarction.  Scattered punctate foci of T2 and FLAIR signal within the frontal white matter, which could indicate an early manifestation of small vessel disease.  Felicie Morn PA-C [facility] [phone] 08/16/2019, 2:53 PM     Assessment:  54 year old male with acute onset of right foot pain.  1. No focal neurological deficit is noted on exam. 2. Given the history and exam showing severe pain located specifically in the distal metatarsals with the great toe distal metatarsophalangeal joint greater than others, along with warmth of  the great toe region, his presentation may be due to an acute gout flare. Per the acute gout diagnosis rule (UpToDate), he has a score of 6.5 but if urate level is > 5.88 mg/dL, then his score would be 10, which would carry with it a high likelihood of gout as the diagnosis. 3. Overall presentation is not characteristic of stroke. MRI brain is also negative for acute or subacute infarction.   Recommendations: -Imaging of the right foot. Start with plain films to assess for characteristic erosive appearance ("overhanging edge") in the region of the pain. --Serum urate levels --May need joint aspiration -Hydration -Treat for joint inflammation  I have seen and examined the patient. I have formulated the assessment and recommendations. 54 year old male with acute onset of right foot pain. Right great toe tenderness and warmth are noted. Gout is suspected. MRI brain is negative for acute stroke. Recommendations as above.  Electronically signed: Dr. Caryl Pina

## 2019-08-16 NOTE — Plan of Care (Signed)
Problem: Clinical Measurements: Goal: Ability to maintain clinical measurements within normal limits will improve Outcome: Progressing Goal: Will remain free from infection Outcome: Progressing Goal: Respiratory complications will improve Outcome: Progressing   Problem: Activity: Goal: Risk for activity intolerance will decrease Outcome: Progressing   

## 2019-08-16 NOTE — ED Notes (Signed)
To bedside at request of teleneuro. Assessed visual fields and sensation for doctor. Pt showed no deficit in visual fields. Pt did demonstrate decreased sensation on right sided (face, arm and leg).

## 2019-08-16 NOTE — ED Notes (Signed)
Pt c/o burning, throbbing pain in right foot radiating up. Dr. Blinda Leatherwood aware.

## 2019-08-16 NOTE — Progress Notes (Signed)
  Echocardiogram 2D Echocardiogram has been performed.  Randy Rose 08/16/2019, 1:40 PM

## 2019-08-16 NOTE — H&P (Addendum)
History and Physical        Hospital Admission Note Date: 08/16/2019  Patient name: Franke Menter Medical record number: [id number] Date of birth: [birth year] Age: 54 y.o. Gender: male  PCP: Loura Pardon, PA  Patient coming from: Home  Lives with: alone At baseline, ambulates: Independently  Chief Complaint    Chief Complaint  Patient presents with  . Numbness      HPI:   This is a 54 year old male with past medical history of depression, hypertension presented to [facility] on 7/30 for evaluation of right foot weakness and pain which started in the a.m. of 7/30 upon awakening.  Noted that he was last normal in the evening prior to going to bed.  Patient states that upon awakening getting ready for bed he noticed a discomfort in his right plantar foot but did not think much of it.  Throughout the day it started to worsen and at 1 point he went to stand up and had severe pain in his right foot.  Occasionally has radiation to right medial thigh.  Additionally, he had sensory changes in his right upper extremity as well as right face later in the day. States that when he stands up he has significant weakness in his right foot.  Also reports that when he moves his right leg is occasionally induces the need to urinate.  ED Course: Afebrile and hemodynamically stable on room air.  Noted to have subjective decreased sensation to light touch over the entire right side of his body and face as well as decreased hearing in the right ear and decreased strength in right upper and lower extremity.  CMP, CBC, UA, UDS, CXR, CT head overall unremarkable.  Telemetry neurology consulted and suspected that he may have had a small stroke and recommended starting the patient on aspirin and obtaining an MRI.  He did not receive TPA as he was out of the window.  He was transferred to [facility] for further  neurologic work-up.  COVID-19 negative  Vitals:   08/16/19 0700 08/16/19 1158  BP: (!) 135/86 (!) 141/87  Pulse: 78 70  Resp: 16 18  Temp:  97.7 F (36.5 C)  SpO2: 95% 98%     Review of Systems:  Review of Systems  Constitutional: Negative for chills and fever.  Eyes: Negative.   Respiratory: Negative for shortness of breath.   Cardiovascular: Negative.  Negative for chest pain and palpitations.  Gastrointestinal: Negative for nausea and vomiting.  Musculoskeletal: Positive for back pain.  Neurological: Positive for sensory change and headaches. Negative for dizziness and speech change.  All other systems reviewed and are negative.   Medical/Social/Family History   Past Medical History: Past Medical History:  Diagnosis Date  . Depression   . Hypertension     History reviewed. No pertinent surgical history.  Medications: Prior to Admission medications   Medication Sig Start Date End Date Taking? Authorizing Provider  albuterol (VENTOLIN HFA) 108 (90 Base) MCG/ACT inhaler  06/09/19   [provider]  hydrOXYzine (VISTARIL) 50 MG capsule Take 50 mg by mouth 2 (two) times daily as needed. 05/23/19   [provider]  ibuprofen (ADVIL) 600 MG tablet Take 600 mg by mouth  3 (three) times daily. 06/09/19   [provider]  lisinopril (PRINIVIL,ZESTRIL) 20 MG tablet Take 20 mg by mouth daily.    [provider]  lisinopril-hydrochlorothiazide (ZESTORETIC) 20-12.5 MG tablet Take 2 tablets by mouth daily. 05/14/19   [provider]  valACYclovir (VALTREX) 1000 MG tablet Take 1,000 mg by mouth 2 (two) times daily. 06/09/19   [provider]  vortioxetine HBr (TRINTELLIX) 10 MG TABS tablet Take 10 mg by mouth daily.    [provider]    Allergies:  No Known Allergies  Social History:  reports that he has never smoked. He has never used smokeless tobacco. He reports that he does not drink alcohol and does not use  drugs.  Family History: Family History  Problem Relation Age of Onset  . Hypertension Mother   . Hyperlipidemia Mother   . Hyperlipidemia Father   . Hypertension Father      Objective   Physical Exam: Blood pressure (!) 141/87, pulse 70, temperature 97.7 F (36.5 C), temperature source Oral, resp. rate 18, height 6\' 1"  (1.854 m), weight (!) 117 kg, SpO2 98 %.  Physical Exam Vitals and nursing note reviewed.  Constitutional:      Appearance: Normal appearance.  HENT:     Head: Normocephalic and atraumatic.  Eyes:     Conjunctiva/sclera: Conjunctivae normal.  Cardiovascular:     Rate and Rhythm: Normal rate and regular rhythm.  Pulmonary:     Effort: Pulmonary effort is normal.     Breath sounds: Normal breath sounds.  Abdominal:     General: Abdomen is flat.     Palpations: Abdomen is soft.  Musculoskeletal:     Comments: Unable to bear weight on right foot Negative straight leg raise test Unable to dorsi or plantarflex his toes on the right Remaining extremities unremarkable  Skin:    Coloration: Skin is not jaundiced or pale.  Neurological:     Mental Status: He is alert and oriented to person, place, and time.     Cranial Nerves: No cranial nerve deficit.     Motor: Weakness present.     Comments: Weakness with elevating right leg at the hip Bilateral patellar reflexes symmetric Negative kernig/brudzinski  Psychiatric:        Mood and Affect: Mood normal.        Behavior: Behavior normal.     LABS on Admission: I have personally reviewed all the labs and imaging below    Basic Metabolic Panel: Recent Labs  Lab 08/15/19 1814  NA 142  K 3.4*  CL 102  CO2 28  GLUCOSE 93  BUN 16  CREATININE 1.00  CALCIUM 9.1   Liver Function Tests: Recent Labs  Lab 08/15/19 1814  AST 29  ALT 35  ALKPHOS 68  BILITOT 0.7  PROT 8.1  ALBUMIN 4.6   No results for input(s): LIPASE, AMYLASE in the last 168 hours. No results for input(s): AMMONIA in the last 168  hours. CBC: Recent Labs  Lab 08/15/19 1814  WBC 7.6  NEUTROABS 3.9  HGB 15.5  HCT 48.2  MCV 87.5  PLT 243   Cardiac Enzymes: No results for input(s): CKTOTAL, CKMB, CKMBINDEX, TROPONINI in the last 168 hours. BNP: Invalid input(s): POCBNP CBG: No results for input(s): GLUCAP in the last 168 hours.  Radiological Exams on Admission:  DG Chest 2 View  Result Date: 08/15/2019 CLINICAL DATA:  Weakness RIGHT-sided upper and lower extremity weakness decreased mobility since 05/30 EXAM: CHEST - 2  VIEW COMPARISON:  CT of 11/14/2018 FINDINGS: Trachea is midline. Cardiomediastinal contours and hilar structures are normal. Lungs are clear. No sign of pleural effusion. Visualized skeletal structures on limited assessment are unremarkable. IMPRESSION: No acute cardiopulmonary disease. Electronically Signed   By: Donzetta KohutGeoffrey  Wile M.D.   On: 08/15/2019 19:08   CT HEAD WO CONTRAST  Result Date: 08/15/2019 CLINICAL DATA:  Neuro deficit, acute, stroke suspected Right-sided upper and lower extremity weakness. EXAM: CT HEAD WITHOUT CONTRAST TECHNIQUE: Contiguous axial images were obtained from the base of the skull through the vertex without intravenous contrast. COMPARISON:  Head CT 09/29/2017 FINDINGS: Brain: No intracranial hemorrhage, mass effect, or midline shift. No hydrocephalus. The basilar cisterns are patent. No evidence of territorial infarct or acute ischemia. No extra-axial or intracranial fluid collection. Vascular: No hyperdense vessel. Skull: No fracture or focal lesion. Sinuses/Orbits: Paranasal sinuses and mastoid air cells are clear. The visualized orbits are unremarkable. Other: None. IMPRESSION: Negative noncontrast head CT. Electronically Signed   By: Narda RutherfordMelanie  Sanford M.D.   On: 08/15/2019 19:11      EKG: Independently reviewed   A & P   Principal Problem:   Neurosensory deficit Active Problems:   Essential hypertension   Anxiety   1. Nonspecific right-sided neurologic  changes: Decreased sensation on right face, decreased sensation right upper extremity, pain of right plantar foot with right weakness and decreased sensation to touch -unknown etiology a. AAOx3, negative straight leg raise test, no trauma or inciting event b. All lab work and imaging (CXR, CT brain) so far unremarkable c. MRI brain without contrast to evaluate for stroke d. May consider lumbar spine MRI for disc pathology, though this would not explain his arm or facial neurologic changes e. Echo f. Neurology consult - discussed with Dr. Otelia LimesLindzen  2. Hypertension a. Continue home meds  3. Anxiety a. Continue home meds   DVT prophylaxis: lovenox   Code Status: Not on file  Diet: Heart healthy Family Communication: Admission, patients condition and plan of care including tests being ordered have been discussed with the patient who indicates understanding and agrees with the plan and Code Status.  Disposition Plan: The appropriate patient status for this patient is OBSERVATION. Observation status is judged to be reasonable and necessary in order to provide the required intensity of service to ensure the patient's safety. The patient's presenting symptoms, physical exam findings, and initial radiographic and laboratory data in the context of their medical condition is felt to place them at decreased risk for further clinical deterioration. Furthermore, it is anticipated that the patient will be medically stable for discharge from the hospital within 2 midnights of admission. The following factors support the patient status of observation.   " The patient's presenting symptoms include right foot pain/weakness. " The physical exam findings include right foot pain and RLE weakness. " The initial radiographic and laboratory data are unremarkable.    Status is: Observation  The patient remains OBS appropriate and will d/c before 2 midnights.  Dispo: The patient is from: Home               Anticipated d/c is to: Home              Anticipated d/c date is: 1 day              Patient currently is not medically stable to d/c.      Consultants  . Neurology  Procedures  . none  Time Spent on Admission: 68 minutes  Jae Dire, DO [facility] Pager [phone] 08/16/2019, 12:00 PM

## 2019-08-17 ENCOUNTER — Observation Stay (HOSPITAL_COMMUNITY): Payer: 59

## 2019-08-17 DIAGNOSIS — I10 Essential (primary) hypertension: Secondary | ICD-10-CM | POA: Diagnosis not present

## 2019-08-17 LAB — CBC
HCT: 44 % (ref 39.0–52.0)
Hemoglobin: 14.3 g/dL (ref 13.0–17.0)
MCH: 28.6 pg (ref 26.0–34.0)
MCHC: 32.5 g/dL (ref 30.0–36.0)
MCV: 88 fL (ref 80.0–100.0)
Platelets: 223 10*3/uL (ref 150–400)
RBC: 5 MIL/uL (ref 4.22–5.81)
RDW: 13.2 % (ref 11.5–15.5)
WBC: 5.6 10*3/uL (ref 4.0–10.5)
nRBC: 0 % (ref 0.0–0.2)

## 2019-08-17 LAB — BASIC METABOLIC PANEL
Anion gap: 7 (ref 5–15)
BUN: 11 mg/dL (ref 6–20)
CO2: 27 mmol/L (ref 22–32)
Calcium: 8.7 mg/dL — ABNORMAL LOW (ref 8.9–10.3)
Chloride: 108 mmol/L (ref 98–111)
Creatinine, Ser: 0.85 mg/dL (ref 0.61–1.24)
GFR calc Af Amer: >60 mL/min (ref >60)
GFR calc non Af Amer: >60 mL/min (ref >60)
Glucose, Bld: 108 mg/dL — ABNORMAL HIGH (ref 70–99)
Potassium: 3.7 mmol/L (ref 3.5–5.1)
Sodium: 142 mmol/L (ref 135–145)

## 2019-08-17 MED ORDER — COLCHICINE 0.6 MG PO TABS: 0.6000 mg | ORAL_TABLET | Freq: Two times a day (BID) | ORAL | 0 refills | Status: DC

## 2019-08-17 MED ORDER — COLCHICINE 0.6 MG PO CAPS: 0.6000 mg | ORAL_CAPSULE | Freq: Two times a day (BID) | ORAL | 0 refills | Status: DC

## 2019-08-17 MED ORDER — AMLODIPINE BESYLATE 10 MG PO TABS: 10.0000 mg | ORAL_TABLET | Freq: Every day | ORAL | 0 refills | Status: DC

## 2019-08-17 MED ORDER — COLCHICINE 0.6 MG PO TABS
1.2000 mg | ORAL_TABLET | Freq: Every day | ORAL | Status: DC
  Administered 2019-08-17: 1.2 mg via ORAL
  Filled 2019-08-17: qty 2

## 2019-08-17 NOTE — Social Work (Signed)
CSW contacted to provide patient transportation back to [employer] [employer]. CSW scheduled patient's transportation with safe transport. CSW provided RN with patient pick-up information. No additional social work needs at this time.  Verlon Au, LCSWA Clinical Social Worker

## 2019-08-17 NOTE — Care Management (Addendum)
13:50 Could not reach [facility] pharmacist to check cost of colchicine. Spoke w [facility], who states that they will need a prior auth for it. Sent message to Dr Natale Milch to call [phone] for prior auth.  14:15 Spoke w [facility] pharmacist who confirmed request for PA from insurance.

## 2019-08-17 NOTE — Plan of Care (Signed)

## 2019-08-17 NOTE — Discharge Summary (Signed)
Physician Discharge Summary  Randy Rose EAV:[id number] DOB: August 03, 1965 DOA: 08/15/2019  PCP: Loura Pardon, PA  Admit date: 08/15/2019 Discharge date: 08/17/2019  Admitted From: Home Disposition: Home  Recommendations for Outpatient Follow-up:  1. Follow up with PCP in 1-2 days as scheduled 2. Please obtain BMP/CBC/uric acid level in one week  Discharge Condition: Stable CODE STATUS: Full Diet recommendation: Gout specific diet as discussed at bedside  Brief/Interim Summary: This is a 54 year old male with past medical history of depression, hypertension [facility] of right foot weakness and painwhich started in the a.m. of 7/30 upon awakening. Noted that he was last normal in the evening prior to going to bed. Patient states that upon awakening getting ready for bed he noticed a discomfort in his right plantar foot but did not think much of it. Throughout the day it started to worsen and at 1 point he went to stand up and had severe pain in his right foot. Occasionally has radiation to right medial thigh. Additionally, he had sensory changes in his right upper extremity as well as right face later in the day. States that when he stands up he has significant weakness in his right foot. Also reports that when he moves his right leg is occasionally induces the need to urinate. Afebrile and hemodynamically stable on room air. Noted to have subjective decreased sensation to light touch over the entire right side of his body and face as well as decreased hearing in the right ear and decreased strength in right upper and lower extremity. CMP, CBC, UA, UDS, CXR, CT head overall unremarkable. Telemetry neurology consulted and suspected that he may have had a small stroke and recommended starting the patient on aspirin and obtaining an MRI. He did not receive TPA as he was out of the window. He was transferred to [facility] for further neurologic work-up.  COVID-19 negative.  Patient admitted as above with acute ambulatory dysfunction and pain of his right foot initially concerning for sensory loss with negative imaging, transfer to [facility] for MRI and further neurological evaluation, on further exam patient's findings are more consistent with acute gout flare which is a new diagnosis for him.  Aspiration of the joint was attempted but unavailable, patient improved with supportive care, colchicine, NSAIDs.  At discharge patient will be discharged on colchicine as well as discontinued lisinopril HCTZ and transition to amlodipine for blood pressure control.  Patient is to follow with PCP on 08-18-19 for further evaluation and possible joint aspiration as well as further discussion about gout specific diet and further medication adjustments.   Discharge Diagnoses:  Principal Problem:   Neurosensory deficit Active Problems:   Essential hypertension   Anxiety    Discharge Instructions  Discharge Instructions    Call MD for:  redness, tenderness, or signs of infection (pain, swelling, redness, odor or green/yellow discharge around incision site)   Complete by: As directed    Call MD for:  severe uncontrolled pain   Complete by: As directed    Diet - low sodium heart healthy   Complete by: As directed    Increase activity slowly   Complete by: As directed      Allergies as of 08/17/2019   No Known Allergies     Medication List    STOP taking these medications   lisinopril 20 MG tablet Commonly known as: ZESTRIL   lisinopril-hydrochlorothiazide 20-12.5 MG tablet Commonly known as: ZESTORETIC     TAKE these medications  albuterol 108 (90 Base) MCG/ACT inhaler Commonly known as: VENTOLIN HFA   amLODipine 10 MG tablet Commonly known as: NORVASC Take 1 tablet (10 mg total) by mouth daily.   colchicine 0.6 MG tablet Take 1 tablet (0.6 mg total) by mouth 2 (two) times daily.   hydrOXYzine 50 MG capsule Commonly known as:  VISTARIL Take 50 mg by mouth 2 (two) times daily as needed.   ibuprofen 600 MG tablet Commonly known as: ADVIL Take 600 mg by mouth 3 (three) times daily.   Trintellix 10 MG Tabs tablet Generic drug: vortioxetine HBr Take 10 mg by mouth daily.   valACYclovir 1000 MG tablet Commonly known as: VALTREX Take 1,000 mg by mouth 2 (two) times daily.       No Known Allergies  Consultations: Neurology  Procedures/Studies: DG Chest 2 View  Result Date: 08/15/2019 CLINICAL DATA:  Weakness RIGHT-sided upper and lower extremity weakness decreased mobility since 05/30 EXAM: CHEST - 2 VIEW COMPARISON:  CT of 11/14/2018 FINDINGS: Trachea is midline. Cardiomediastinal contours and hilar structures are normal. Lungs are clear. No sign of pleural effusion. Visualized skeletal structures on limited assessment are unremarkable. IMPRESSION: No acute cardiopulmonary disease. Electronically Signed   By: Donzetta Kohut M.D.   On: 08/15/2019 19:08   CT HEAD WO CONTRAST  Result Date: 08/15/2019 CLINICAL DATA:  Neuro deficit, acute, stroke suspected Right-sided upper and lower extremity weakness. EXAM: CT HEAD WITHOUT CONTRAST TECHNIQUE: Contiguous axial images were obtained from the base of the skull through the vertex without intravenous contrast. COMPARISON:  Head CT 09/29/2017 FINDINGS: Brain: No intracranial hemorrhage, mass effect, or midline shift. No hydrocephalus. The basilar cisterns are patent. No evidence of territorial infarct or acute ischemia. No extra-axial or intracranial fluid collection. Vascular: No hyperdense vessel. Skull: No fracture or focal lesion. Sinuses/Orbits: Paranasal sinuses and mastoid air cells are clear. The visualized orbits are unremarkable. Other: None. IMPRESSION: Negative noncontrast head CT. Electronically Signed   By: Narda Rutherford M.D.   On: 08/15/2019 19:11   MR BRAIN WO CONTRAST  Result Date: 08/16/2019 CLINICAL DATA:  Acute stroke suspected.  Right extremity  weakness. EXAM: MRI HEAD WITHOUT CONTRAST TECHNIQUE: Multiplanar, multiecho pulse sequences of the brain and surrounding structures were obtained without intravenous contrast. COMPARISON:  Head CT yesterday. FINDINGS: Brain: Diffusion imaging does not show any acute or subacute infarction. The brainstem and cerebellum are normal. Cerebral hemispheres show mild scattered foci of T2 and FLAIR signal in the frontal white matter. No cortical or large vessel territory insult. No mass lesion, hemorrhage, hydrocephalus or extra-axial collection. Vascular: Major vessels at the base of the brain show flow. Skull and upper cervical spine: Negative Sinuses/Orbits: Clear/normal Other: None IMPRESSION: No acute or subacute infarction. Scattered punctate foci of T2 and FLAIR signal within the frontal white matter, which can indicate an early manifestation of small vessel disease but can also be seen in otherwise normal patients. Electronically Signed   By: Paulina Fusi M.D.   On: 08/16/2019 13:56   DG Foot 2 Views Right  Result Date: 08/17/2019 CLINICAL DATA:  54 year old complaining of RIGHT foot pain localizing to the metatarsals, especially the first metatarsal, associated with numbness. EXAM: RIGHT FOOT - 2 VIEW COMPARISON:  None. FINDINGS: No evidence of acute, subacute or healed fractures. Well-preserved joint spaces. No erosive changes to suggest gout. Well-preserved bone mineral density. Very small calcaneal spur. No significant intrinsic osseous abnormalities. IMPRESSION: No acute or significant osseous abnormality. Specifically, no erosive changes to suggest a diagnosis  of gout. Electronically Signed   By: Hulan Saashomas  Lawrence M.D.   On: 08/17/2019 09:29   ECHOCARDIOGRAM COMPLETE  Result Date: 08/16/2019    ECHOCARDIOGRAM REPORT   Patient Name:   Randy Rose Date of Exam: 08/16/2019 Medical Rec #:  [id number]      Height:       73.0 in Accession #:    [id number]     Weight:       257.9 lb Date of Birth:  1965-10-04       BSA:          2.397 m Patient Age:    54 years       BP:           141/87 mmHg Patient Gender: M              HR:           70 bpm. Exam Location:  Inpatient Procedure: 2D Echo, Cardiac Doppler and Color Doppler Indications:    CVA  History:        Patient has no prior history of Echocardiogram examinations.                 Risk Factors:Hypertension, Dyslipidemia and Obesity.  Sonographer:    Lavenia AtlasBrooke Strickland Referring Phys: [id number] JARED E SEGAL IMPRESSIONS  1. Left ventricular ejection fraction, by estimation, is 60 to 65%. The left ventricle has normal function. The left ventricle has no regional wall motion abnormalities. There is mild left ventricular hypertrophy. Left ventricular diastolic parameters are consistent with Grade I diastolic dysfunction (impaired relaxation).  2. Right ventricular systolic function is normal. The right ventricular size is normal. There is normal pulmonary artery systolic pressure.  3. The mitral valve is normal in structure. Trivial mitral valve regurgitation. No evidence of mitral stenosis.  4. The aortic valve is tricuspid. Aortic valve regurgitation is not visualized. No aortic stenosis is present.  5. The inferior vena cava is normal in size with greater than 50% respiratory variability, suggesting right atrial pressure of 3 mmHg. FINDINGS  Left Ventricle: Left ventricular ejection fraction, by estimation, is 60 to 65%. The left ventricle has normal function. The left ventricle has no regional wall motion abnormalities. The left ventricular internal cavity size was normal in size. There is  mild left ventricular hypertrophy. Left ventricular diastolic parameters are consistent with Grade I diastolic dysfunction (impaired relaxation). Normal left ventricular filling pressure. Right Ventricle: The right ventricular size is normal. No increase in right ventricular wall thickness. Right ventricular systolic function is normal. There is normal pulmonary artery systolic  pressure. The tricuspid regurgitant velocity is 2.30 m/s, and  with an assumed right atrial pressure of 3 mmHg, the estimated right ventricular systolic pressure is 24.2 mmHg. Left Atrium: Left atrial size was normal in size. Right Atrium: Right atrial size was normal in size. Pericardium: There is no evidence of pericardial effusion. Mitral Valve: The mitral valve is normal in structure. Trivial mitral valve regurgitation. No evidence of mitral valve stenosis. Tricuspid Valve: The tricuspid valve is normal in structure. Tricuspid valve regurgitation is trivial. No evidence of tricuspid stenosis. Aortic Valve: The aortic valve is tricuspid. Aortic valve regurgitation is not visualized. No aortic stenosis is present. Aortic valve mean gradient measures 3.0 mmHg. Aortic valve peak gradient measures 5.3 mmHg. Aortic valve area, by VTI measures 3.60 cm. Pulmonic Valve: The pulmonic valve was not well visualized. Pulmonic valve regurgitation is not visualized. No evidence of pulmonic stenosis. Aorta: The aortic root  is normal in size and structure. Pulmonary Artery: Indeterminant PASP, inadequate TR jet. Venous: The inferior vena cava is normal in size with greater than 50% respiratory variability, suggesting right atrial pressure of 3 mmHg. IAS/Shunts: No atrial level shunt detected by color flow Doppler.  LEFT VENTRICLE PLAX 2D LVIDd:         4.50 cm  Diastology LVIDs:         3.30 cm  LV e' lateral:   8.49 cm/s LV PW:         1.10 cm  LV E/e' lateral: 6.1 LV IVS:        1.10 cm  LV e' medial:    6.53 cm/s LVOT diam:     2.40 cm  LV E/e' medial:  7.9 LV SV:         85 LV SV Index:   35 LVOT Area:     4.52 cm  RIGHT VENTRICLE RV Basal diam:  3.50 cm RV S prime:     9.68 cm/s TAPSE (M-mode): 2.8 cm LEFT ATRIUM             Index       RIGHT ATRIUM           Index LA diam:        3.20 cm 1.33 cm/m  RA Area:     18.60 cm LA Vol (A2C):   50.3 ml 20.98 ml/m RA Volume:   51.70 ml  21.57 ml/m LA Vol (A4C):   63.7 ml 26.57  ml/m LA Biplane Vol: 58.3 ml 24.32 ml/m  AORTIC VALVE AV Area (Vmax):    3.36 cm AV Area (Vmean):   2.98 cm AV Area (VTI):     3.60 cm AV Vmax:           115.11 cm/s AV Vmean:          80.880 cm/s AV VTI:            0.236 m AV Peak Grad:      5.3 mmHg AV Mean Grad:      3.0 mmHg LVOT Vmax:         85.50 cm/s LVOT Vmean:        53.200 cm/s LVOT VTI:          0.188 m LVOT/AV VTI ratio: 0.80  AORTA Ao Root diam: 3.60 cm MITRAL VALVE               TRICUSPID VALVE MV Area (PHT): 3.31 cm    TR Peak grad:   21.2 mmHg MV Decel Time: 229 msec    TR Vmax:        230.00 cm/s MV E velocity: 51.40 cm/s MV A velocity: 60.00 cm/s  SHUNTS MV E/A ratio:  0.86        Systemic VTI:  0.19 m                            Systemic Diam: 2.40 cm Dina Rich MD Electronically signed by Dina Rich MD Signature Date/Time: 08/16/2019/1:49:47 PM    Final       Subjective: No acute issues or events overnight, right toe pain markedly improving with supportive care as above.  Denies nausea, vomiting, diarrhea, constipation, headache, fevers, chills.   Discharge Exam: Vitals:   08/17/19 0938 08/17/19 1239  BP: (!) 136/78 (!) 142/97  Pulse: 78 89  Resp: 18 20  Temp: 98.1 F (36.7 C) 98  F (36.7 C)  SpO2: 97% 99%   Vitals:   08/16/19 2307 08/17/19 0500 08/17/19 0938 08/17/19 1239  BP: 113/76 127/75 (!) 136/78 (!) 142/97  Pulse: 65 68 78 89  Resp: 18 14 18 20   Temp: 98.6 F (37 C) 97.6 F (36.4 C) 98.1 F (36.7 C) 98 F (36.7 C)  TempSrc: Oral Oral Oral Oral  SpO2: 97% 96% 97% 99%  Weight:      Height:        General: Pt is alert, awake, not in acute distress Cardiovascular: RRR, S1/S2 +, no rubs, no gallops Respiratory: CTA bilaterally, no wheezing, no rhonchi Abdominal: Soft, NT, ND, bowel sounds + Extremities: no edema, no cyanosis, right great toe proximal joint erythematous and tender to palpation    The results of significant diagnostics from this hospitalization (including imaging,  microbiology, ancillary and laboratory) are listed below for reference.     Microbiology: Recent Results (from the past 240 hour(s))  SARS Coronavirus 2 by RT PCR (hospital order, performed in [facility] hospital lab) Nasopharyngeal Nasopharyngeal Swab     Status: None   Collection Time: 08/15/19 10:30 PM   Specimen: Nasopharyngeal Swab  Result Value Ref Range Status   SARS Coronavirus 2 NEGATIVE NEGATIVE Final    Comment: (NOTE) SARS-CoV-2 target nucleic acids are NOT DETECTED.  The SARS-CoV-2 RNA is generally detectable in upper and lower respiratory specimens during the acute phase of infection. The lowest concentration of SARS-CoV-2 viral copies this assay can detect is 250 copies / mL. A negative result does not preclude SARS-CoV-2 infection and should not be used as the sole basis for treatment or other patient management decisions.  A negative result may occur with improper specimen collection / handling, submission of specimen other than nasopharyngeal swab, presence of viral mutation(s) within the areas targeted by this assay, and inadequate number of viral copies (<250 copies / mL). A negative result must be combined with clinical observations, patient history, and epidemiological information.  Fact Sheet for Patients:   08/17/19  Fact Sheet for Healthcare Providers: BoilerBrush.com.cy  This test is not yet approved or  cleared by the https://pope.com/ FDA and has been authorized for detection and/or diagnosis of SARS-CoV-2 by FDA under an Emergency Use Authorization (EUA).  This EUA will remain in effect (meaning this test can be used) for the duration of the COVID-19 declaration under Section 564(b)(1) of the Act, 21 U.S.C. section 360bbb-3(b)(1), unless the authorization is terminated or revoked sooner.  Performed at [facility], [address] [place], [place]      Labs: BNP (last 3  results) Recent Labs    11/14/18 2102  BNP 14.9   Basic Metabolic Panel: Recent Labs  Lab 08/15/19 1814 08/16/19 1221 08/17/19 0216  NA 142  --  142  K 3.4*  --  3.7  CL 102  --  108  CO2 28  --  27  GLUCOSE 93  --  108*  BUN 16  --  11  CREATININE 1.00 0.90 0.85  CALCIUM 9.1  --  8.7*   Liver Function Tests: Recent Labs  Lab 08/15/19 1814  AST 29  ALT 35  ALKPHOS 68  BILITOT 0.7  PROT 8.1  ALBUMIN 4.6   No results for input(s): LIPASE, AMYLASE in the last 168 hours. No results for input(s): AMMONIA in the last 168 hours. CBC: Recent Labs  Lab 08/15/19 1814 08/16/19 1221 08/17/19 0216  WBC 7.6 5.6 5.6  NEUTROABS 3.9  --   --   HGB 15.5 14.7 14.3  HCT 48.2 45.6 44.0  MCV 87.5 88.4 88.0  PLT 243 213 223   Cardiac Enzymes: No results for input(s): CKTOTAL, CKMB, CKMBINDEX, TROPONINI in the last 168 hours. BNP: Invalid input(s): POCBNP CBG: No results for input(s): GLUCAP in the last 168 hours. D-Dimer No results for input(s): DDIMER in the last 72 hours. Hgb A1c No results for input(s): HGBA1C in the last 72 hours. Lipid Profile No results for input(s): CHOL, HDL, LDLCALC, TRIG, CHOLHDL, LDLDIRECT in the last 72 hours. Thyroid function studies No results for input(s): TSH, T4TOTAL, T3FREE, THYROIDAB in the last 72 hours.  Invalid input(s): FREET3 Anemia work up No results for input(s): VITAMINB12, FOLATE, FERRITIN, TIBC, IRON, RETICCTPCT in the last 72 hours. Urinalysis    Component Value Date/Time   COLORURINE YELLOW 08/15/2019 1835   APPEARANCEUR CLEAR 08/15/2019 1835   LABSPEC >1.030 (H) 08/15/2019 1835   PHURINE 6.0 08/15/2019 1835   GLUCOSEU NEGATIVE 08/15/2019 1835   HGBUR NEGATIVE 08/15/2019 1835   BILIRUBINUR NEGATIVE 08/15/2019 1835   KETONESUR NEGATIVE 08/15/2019 1835   PROTEINUR NEGATIVE 08/15/2019 1835   NITRITE NEGATIVE 08/15/2019 1835   LEUKOCYTESUR NEGATIVE 08/15/2019 1835   Sepsis Labs Invalid input(s): PROCALCITONIN,  WBC,   LACTICIDVEN Microbiology Recent Results (from the past 240 hour(s))  SARS Coronavirus 2 by RT PCR (hospital order, performed in [facility] hospital lab) Nasopharyngeal Nasopharyngeal Swab     Status: None   Collection Time: 08/15/19 10:30 PM   Specimen: Nasopharyngeal Swab  Result Value Ref Range Status   SARS Coronavirus 2 NEGATIVE NEGATIVE Final    Comment: (NOTE) SARS-CoV-2 target nucleic acids are NOT DETECTED.  The SARS-CoV-2 RNA is generally detectable in upper and lower respiratory specimens during the acute phase of infection. The lowest concentration of SARS-CoV-2 viral copies this assay can detect is 250 copies / mL. A negative result does not preclude SARS-CoV-2 infection and should not be used as the sole basis for treatment or other patient management decisions.  A negative result may occur with improper specimen collection / handling, submission of specimen other than nasopharyngeal swab, presence of viral mutation(s) within the areas targeted by this assay, and inadequate number of viral copies (<250 copies / mL). A negative result must be combined with clinical observations, patient history, and epidemiological information.  Fact Sheet for Patients:   BoilerBrush.com.cy  Fact Sheet for Healthcare Providers: https://pope.com/  This test is not yet approved or  cleared by the [place] FDA and has been authorized for detection and/or diagnosis of SARS-CoV-2 by FDA under an Emergency Use Authorization (EUA).  This EUA will remain in effect (meaning this test can be used) for the duration of the COVID-19 declaration under Section 564(b)(1) of the Act, 21 U.S.C. section 360bbb-3(b)(1), unless the authorization is terminated or revoked sooner.  Performed at [facility], [address] [place], [place] [address]      Time coordinating discharge: Over 30 minutes  SIGNED:   Azucena Fallen,  DO [facility] 08/17/2019, 1:02 PM Pager   If 7PM-7AM, please contact night-coverage [email or web address]

## 2019-08-17 NOTE — Progress Notes (Signed)
PIV removed without complications. All personal belongings collected from the room by patient. Discharge instructions reviewed with patient, all questions answered for patient. Patient discharged from via wheelchair to safe transport vehicle that will take patient to his personal vehicle at [facility].

## 2019-09-05 DIAGNOSIS — M1A9XX0 Chronic gout, unspecified, without tophus (tophi): Secondary | ICD-10-CM | POA: Diagnosis present

## 2019-09-11 ENCOUNTER — Emergency Department
Admission: EM | Admit: 2019-09-11 | Discharge: 2019-09-11 | Disposition: A | Discharge status: Home / Self Care | Payer: 59 | Source: Home / Self Care

## 2019-09-11 ENCOUNTER — Emergency Department (INDEPENDENT_AMBULATORY_CARE_PROVIDER_SITE_OTHER): Payer: 59

## 2019-09-11 ENCOUNTER — Other Ambulatory Visit: Payer: Self-pay

## 2019-09-11 DIAGNOSIS — R221 Localized swelling, mass and lump, neck: Secondary | ICD-10-CM

## 2019-09-11 LAB — POCT RAPID STREP A (OFFICE): Rapid Strep A Screen: NEGATIVE

## 2019-09-11 MED ORDER — CLINDAMYCIN HCL 300 MG PO CAPS
300.0000 mg | ORAL_CAPSULE | Freq: Four times a day (QID) | ORAL | 0 refills | Status: DC
Start: 2019-09-11 — End: 2021-06-28

## 2019-09-11 MED ORDER — PREDNISONE 50 MG PO TABS
50.0000 mg | ORAL_TABLET | Freq: Every day | ORAL | 0 refills | Status: AC
Start: 2019-09-11 — End: 2019-09-16

## 2019-09-11 NOTE — ED Triage Notes (Signed)
Pt c/o slight sore throat and LT sided neck swelling x 3 days. Denies fever or other sxs. Gargling with salt water. Pain 7/10

## 2019-09-11 NOTE — Discharge Instructions (Signed)
  Please take antibiotics as prescribed and be sure to complete entire course even if you start to feel better to ensure infection does not come back. You can try warm compresses 2-3 times daily to help with pain and inflammation.  Please follow up with your primary care provider Monday if not improving, or return to urgent care if not able to get in with family medicine.  Call 911 or go to the hospital if worsening pain, swelling, or other symptoms develop such as trouble breathing or swallowing.

## 2019-09-11 NOTE — ED Provider Notes (Signed)
[facility]    CSN: [id number] Arrival date & time: 09/11/19  1335      History   Chief Complaint Chief Complaint  Patient presents with  . Sore Throat    HPI Randy Rose is a 54 y.o. male.   HPI Randy Rose is a 54 y.o. male presenting to UC with c/o sore throat pain that started 3 days ago, associated Right side neck swelling.  He has tried gargling water without relief. Pain is 3/10.  Denies fever, chills, n/v/d. Denies trouble breathing. Denies difficulty swallowing liquids.  Per medical records, pt had a CT soft tissue neck due to neck abscess/dental extraction (03/11/19), CT performed on 03/19/19 showed a Left submandibular gland stone.    Pt has not had trouble again until 3 days ago.  Past Medical History:  Diagnosis Date  . Depression   . Hypertension     Patient Active Problem List   Diagnosis Date Noted  . Neurosensory deficit 08/16/2019  . Essential hypertension 08/16/2019  . Anxiety 08/16/2019  . Stroke [facility]) 08/15/2019    History reviewed. No pertinent surgical history.     Home Medications    Prior to Admission medications   Medication Sig Start Date End Date Taking? Authorizing Provider  albuterol (VENTOLIN HFA) 108 (90 Base) MCG/ACT inhaler  06/09/19   [provider]  amLODipine (NORVASC) 10 MG tablet Take 1 tablet (10 mg total) by mouth daily. 08/17/19 09/16/19  Azucena Fallen, MD  clindamycin (CLEOCIN) 300 MG capsule Take 1 capsule (300 mg total) by mouth 4 (four) times daily. X 7 days 09/11/19   Lurene Shadow, PA-C  Colchicine (MITIGARE) 0.6 MG CAPS Take 0.6 mg by mouth in the morning and at bedtime. 08/17/19 09/16/19  Azucena Fallen, MD  hydrOXYzine (VISTARIL) 50 MG capsule Take 50 mg by mouth 2 (two) times daily as needed. 05/23/19   [provider]  ibuprofen (ADVIL) 600 MG tablet Take 600 mg by mouth 3 (three) times daily. 06/09/19   [provider]  predniSONE (DELTASONE) 50 MG tablet Take 1  tablet (50 mg total) by mouth daily with breakfast for 5 days. 09/11/19 09/16/19  Lurene Shadow, PA-C  valACYclovir (VALTREX) 1000 MG tablet Take 1,000 mg by mouth 2 (two) times daily. 06/09/19   [provider]  vortioxetine HBr (TRINTELLIX) 10 MG TABS tablet Take 10 mg by mouth daily.    [provider]    Family History Family History  Problem Relation Age of Onset  . Hypertension Mother   . Hyperlipidemia Mother   . Hyperlipidemia Father   . Hypertension Father     Social History Social History   Tobacco Use  . Smoking status: Never Smoker  . Smokeless tobacco: Never Used  Vaping Use  . Vaping Use: Never used  Substance Use Topics  . Alcohol use: No  . Drug use: No     Allergies   Patient has no known allergies.   Review of Systems Review of Systems  Constitutional: Negative for chills and fever.  HENT: Positive for dental problem and facial swelling. Negative for sore throat.   Gastrointestinal: Negative for diarrhea and vomiting.  Neurological: Negative for dizziness and headaches.     Physical Exam Triage Vital Signs ED Triage Vitals  Enc Vitals Group     BP 09/11/19 1434 (!) 157/85     Pulse Rate 09/11/19 1434 77     Resp 09/11/19 1434 18     Temp  09/11/19 1434 98.3 F (36.8 C)     Temp Source 09/11/19 1434 Oral     SpO2 09/11/19 1434 97 %     Weight --      Height --      Head Circumference --      Peak Flow --      Pain Score 09/11/19 1436 7     Pain Loc --      Pain Edu? --      Excl. in GC? --    No data found.  Updated Vital Signs BP (!) 157/85 (BP Location: Right Arm)   Pulse 77   Temp 98.3 F (36.8 C) (Oral)   Resp 18   SpO2 97%   Visual Acuity Right Eye Distance:   Left Eye Distance:   Bilateral Distance:    Right Eye Near:   Left Eye Near:    Bilateral Near:     Physical Exam Vitals and nursing note reviewed.  Constitutional:      General: He is not in acute distress.    Appearance: He is  well-developed. He is not ill-appearing, toxic-appearing or diaphoretic.  HENT:     Head: Normocephalic and atraumatic.     Right Ear: Tympanic membrane and ear canal normal.     Left Ear: Tympanic membrane and ear canal normal.     Nose: Nose normal.     Right Sinus: No maxillary sinus tenderness or frontal sinus tenderness.     Left Sinus: No maxillary sinus tenderness or frontal sinus tenderness.     Mouth/Throat:     Lips: Pink.     Mouth: Mucous membranes are moist.     Dentition: Gingival swelling and dental caries present. No dental abscesses.     Pharynx: Oropharynx is clear. Uvula midline. No pharyngeal swelling, oropharyngeal exudate, posterior oropharyngeal erythema or uvula swelling.     Tonsils: No tonsillar exudate or tonsillar abscesses.   Neck:   Cardiovascular:     Rate and Rhythm: Normal rate and regular rhythm.  Pulmonary:     Effort: Pulmonary effort is normal. No respiratory distress.     Breath sounds: Normal breath sounds.  Musculoskeletal:        General: Normal range of motion.     Cervical back: Normal range of motion and neck supple.  Lymphadenopathy:     Cervical: Cervical adenopathy present.  Skin:    General: Skin is warm and dry.  Neurological:     Mental Status: He is alert and oriented to person, place, and time.  Psychiatric:        Behavior: Behavior normal.      UC Treatments / Results  Labs (all labs ordered are listed, but only abnormal results are displayed) Labs Reviewed  POCT RAPID STREP A (OFFICE)    EKG   Radiology US SOFT TISSUE HEAD & NECK (NON-THYROID)  Result Date: 09/11/2019 CLINICAL DATA:  Left neck mass noted over the last 3 days which is enlarging and tender. EXAM: ULTRASOUND OF HEAD/NECK SOFT TISSUES TECHNIQUE: Ultrasound examination of the head and neck soft tissues was performed in the area of clinical concern. COMPARISON:  CT neck 03/19/2019 FINDINGS: The area of clinical concern is in the left submandibular  region. The left submandibular gland itself appears normal by ultrasound. The left submandibular stone previously seen by CT cannot be identified in the substance of the gland. This may simply not be visible or it may have passed into the left submandibular duct. Consider  CT evaluation. Level 2 and level 3 lymph nodes are seen on the left, with a level 2 node measuring 2.1 x 0.6 x 0.8 cm, head level 2 level 3 junction node measuring 3.0 x 1.0 x 1.5 cm, and a level 3 node measuring 1.8 x 1.1 x 1.5 cm. None of these show suppuration or gross hyperemia. They may be reactive to inflammatory disease. IMPRESSION: 1. The area of clinical concern in the left submandibular region is normal by ultrasound. The left submandibular gland itself appears normal. The stone previously seen by CT could have passed into the left submandibular duct. Consider CT of the neck with contrast. 2. Borderline enlarged left neck lymph nodes, nonspecific. None of these show suppuration or gross hyperemia. They may be reactive. Electronically Signed   By: Paulina Fusi M.D.   On: 09/11/2019 16:07    Procedures Procedures (including critical care time)  Medications Ordered in UC Medications - No data to display  Initial Impression / Assessment and Plan / UC Course  I have reviewed the triage vital signs and the nursing notes.  Pertinent labs & imaging results that were available during my care of the patient were reviewed by me and considered in my medical decision making (see chart for details).     Discussed imaging with pt Offered CT for further evaluation Pt would like to try trial of steroids and antibiotics. Declined IM Rocephin and Decadron, pt does not like needles Will start pt on oral prednisone to help with swelling and clindamycin, suspected abscess Encouraged close f/u if not improving Discussed symptoms that warrant emergent care in the ED. AVS given  Final Clinical Impressions(s) / UC Diagnoses   Final  diagnoses:  Mass of left side of neck     Discharge Instructions      Please take antibiotics as prescribed and be sure to complete entire course even if you start to feel better to ensure infection does not come back. You can try warm compresses 2-3 times daily to help with pain and inflammation.  Please follow up with your primary care provider Monday if not improving, or return to urgent care if not able to get in with family medicine.  Call 911 or go to the hospital if worsening pain, swelling, or other symptoms develop such as trouble breathing or swallowing.     ED Prescriptions    Medication Sig Dispense Auth. Provider   clindamycin (CLEOCIN) 300 MG capsule Take 1 capsule (300 mg total) by mouth 4 (four) times daily. X 7 days 28 capsule Doroteo Glassman, Oiva Dibari O, PA-C   predniSONE (DELTASONE) 50 MG tablet Take 1 tablet (50 mg total) by mouth daily with breakfast for 5 days. 5 tablet Lurene Shadow, PA-C     PDMP not reviewed this encounter.   Lurene Shadow, [place] 09/12/19 [phone]

## 2020-04-21 ENCOUNTER — Emergency Department (HOSPITAL_BASED_OUTPATIENT_CLINIC_OR_DEPARTMENT_OTHER)
Admission: EM | Admit: 2020-04-21 | Discharge: 2020-04-21 | Disposition: A | Discharge status: Home / Self Care | Payer: Self-pay | Attending: Emergency Medicine | Admitting: Emergency Medicine

## 2020-04-21 ENCOUNTER — Encounter (HOSPITAL_BASED_OUTPATIENT_CLINIC_OR_DEPARTMENT_OTHER): Payer: Self-pay

## 2020-04-21 ENCOUNTER — Other Ambulatory Visit: Payer: Self-pay

## 2020-04-21 ENCOUNTER — Emergency Department (HOSPITAL_BASED_OUTPATIENT_CLINIC_OR_DEPARTMENT_OTHER): Payer: Self-pay

## 2020-04-21 DIAGNOSIS — Z79899 Other long term (current) drug therapy: Secondary | ICD-10-CM | POA: Insufficient documentation

## 2020-04-21 DIAGNOSIS — R059 Cough, unspecified: Secondary | ICD-10-CM

## 2020-04-21 DIAGNOSIS — I10 Essential (primary) hypertension: Secondary | ICD-10-CM | POA: Insufficient documentation

## 2020-04-21 DIAGNOSIS — J069 Acute upper respiratory infection, unspecified: Secondary | ICD-10-CM | POA: Insufficient documentation

## 2020-04-21 DIAGNOSIS — Z20822 Contact with and (suspected) exposure to covid-19: Secondary | ICD-10-CM | POA: Insufficient documentation

## 2020-04-21 LAB — D-DIMER, QUANTITATIVE: D-Dimer, Quant: 0.81 ug/mL-FEU — ABNORMAL HIGH (ref 0.00–0.50)

## 2020-04-21 LAB — CBC WITH DIFFERENTIAL/PLATELET
Abs Immature Granulocytes: 0.02 10*3/uL (ref 0.00–0.07)
Basophils Absolute: 0 10*3/uL (ref 0.0–0.1)
Basophils Relative: 1 %
Eosinophils Absolute: 0.1 10*3/uL (ref 0.0–0.5)
Eosinophils Relative: 2 %
HCT: 45.9 % (ref 39.0–52.0)
Hemoglobin: 15 g/dL (ref 13.0–17.0)
Immature Granulocytes: 0 %
Lymphocytes Relative: 48 %
Lymphs Abs: 2.7 10*3/uL (ref 0.7–4.0)
MCH: 28.4 pg (ref 26.0–34.0)
MCHC: 32.7 g/dL (ref 30.0–36.0)
MCV: 86.8 fL (ref 80.0–100.0)
Monocytes Absolute: 0.4 10*3/uL (ref 0.1–1.0)
Monocytes Relative: 8 %
Neutro Abs: 2.3 10*3/uL (ref 1.7–7.7)
Neutrophils Relative %: 41 %
Platelets: 244 10*3/uL (ref 150–400)
RBC: 5.29 MIL/uL (ref 4.22–5.81)
RDW: 13.7 % (ref 11.5–15.5)
WBC: 5.6 10*3/uL (ref 4.0–10.5)
nRBC: 0 % (ref 0.0–0.2)

## 2020-04-21 LAB — RESP PANEL BY RT-PCR (FLU A&B, COVID) ARPGX2
Influenza A by PCR: NEGATIVE
Influenza B by PCR: NEGATIVE
SARS Coronavirus 2 by RT PCR: NEGATIVE

## 2020-04-21 LAB — BASIC METABOLIC PANEL WITH GFR
Anion gap: 10 (ref 5–15)
BUN: 13 mg/dL (ref 6–20)
CO2: 24 mmol/L (ref 22–32)
Calcium: 9.3 mg/dL (ref 8.9–10.3)
Chloride: 107 mmol/L (ref 98–111)
Creatinine, Ser: 0.96 mg/dL (ref 0.61–1.24)
GFR, Estimated: >60 mL/min
Glucose, Bld: 123 mg/dL — ABNORMAL HIGH (ref 70–99)
Potassium: 3.6 mmol/L (ref 3.5–5.1)
Sodium: 141 mmol/L (ref 135–145)

## 2020-04-21 LAB — TROPONIN I (HIGH SENSITIVITY): Troponin I (High Sensitivity): 3 ng/L (ref <18)

## 2020-04-21 MED ORDER — IOHEXOL 350 MG/ML SOLN
100.0000 mL | Freq: Once | INTRAVENOUS | Status: AC | PRN
  Administered 2020-04-21: 100 mL via INTRAVENOUS

## 2020-04-21 NOTE — ED Provider Notes (Signed)
[facility] EMERGENCY DEPARTMENT Provider Note   CSN: [id number] Arrival date & time: 04/21/20  1650     History Chief Complaint  Patient presents with  . Cough    Randy Rose is a 55 y.o. male.  55 y.o male with a PMH of HTN presents to the ED with a chief complaint of cough, congestion x 2 days. Symptoms began with a sore throat, cough with dark beige sputum. Exacerbation occurs with deep inspiration which he describes "when I had bronchitis in the past". Described as a burning sensation. Alleviated with rest. In addition, sinus pressure along the frontal sinuses which.  Endorses shortness of breath which is worst at night with lying down.He has taken some antihistamines today and tylenol sinus for his headache, no improvement. Endorses nausea which occurred the first two days exacerbated with phlegm, this has now subsided. No fever, abdominal pain, prior hx of blood clots, no hx CAD, no family CAD. No other complaints.   The history is provided by the patient.       Past Medical History:  Diagnosis Date  . Depression   . Hypertension     Patient Active Problem List   Diagnosis Date Noted  . Neurosensory deficit 08/16/2019  . Essential hypertension 08/16/2019  . Anxiety 08/16/2019  . Stroke [facility]) 08/15/2019    History reviewed. No pertinent surgical history.     Family History  Problem Relation Age of Onset  . Hypertension Mother   . Hyperlipidemia Mother   . Hyperlipidemia Father   . Hypertension Father     Social History   Tobacco Use  . Smoking status: Never Smoker  . Smokeless tobacco: Never Used  Vaping Use  . Vaping Use: Never used  Substance Use Topics  . Alcohol use: No  . Drug use: No    Home Medications Prior to Admission medications   Medication Sig Start Date End Date Taking? Authorizing Provider  albuterol (VENTOLIN HFA) 108 (90 Base) MCG/ACT inhaler  06/09/19   [provider]  amLODipine (NORVASC) 10 MG tablet Take  1 tablet (10 mg total) by mouth daily. 08/17/19 09/16/19  Azucena Fallen, MD  clindamycin (CLEOCIN) 300 MG capsule Take 1 capsule (300 mg total) by mouth 4 (four) times daily. X 7 days 09/11/19   Lurene Shadow, PA-C  Colchicine (MITIGARE) 0.6 MG CAPS Take 0.6 mg by mouth in the morning and at bedtime. 08/17/19 09/16/19  Azucena Fallen, MD  hydrOXYzine (VISTARIL) 50 MG capsule Take 50 mg by mouth 2 (two) times daily as needed. 05/23/19   [provider]  ibuprofen (ADVIL) 600 MG tablet Take 600 mg by mouth 3 (three) times daily. 06/09/19   [provider]  valACYclovir (VALTREX) 1000 MG tablet Take 1,000 mg by mouth 2 (two) times daily. 06/09/19   [provider]  vortioxetine HBr (TRINTELLIX) 10 MG TABS tablet Take 10 mg by mouth daily.    [provider]    Allergies    Patient has no known allergies.  Review of Systems   Review of Systems  Constitutional: Negative for fever.  HENT: Positive for sinus pressure and sore throat.   Respiratory: Positive for cough and shortness of breath.   Cardiovascular: Negative for chest pain.  Gastrointestinal: Negative for abdominal pain, nausea and vomiting.  Genitourinary: Negative for flank pain.  Musculoskeletal: Negative for back pain.  Skin: Negative for pallor and wound.  Neurological: Negative for headaches.  All other systems reviewed and are  negative.   Physical Exam Updated Vital Signs BP 134/84   Pulse 80   Temp 98.6 F (37 C) (Oral)   Resp (!) 25   SpO2 97%   Physical Exam Vitals and nursing note reviewed.  Constitutional:      Appearance: He is well-developed.  HENT:     Head: Normocephalic and atraumatic.     Mouth/Throat:     Mouth: Mucous membranes are moist.     Comments: Oropharynx is clear with mild erythema noted.  Uvula is midline without any exudate or PTA. Eyes:     General: No scleral icterus.    Pupils: Pupils are equal, round, and reactive to light.  Cardiovascular:      Rate and Rhythm: Normal rate.     Heart sounds: Normal heart sounds.  Pulmonary:     Effort: Pulmonary effort is normal.     Breath sounds: No wheezing.     Comments: Lungs are diminished to auscultation. Chest:     Chest wall: No tenderness.  Abdominal:     General: Bowel sounds are normal. There is no distension.     Palpations: Abdomen is soft.     Tenderness: There is no abdominal tenderness.  Musculoskeletal:        General: No tenderness or deformity.     Cervical back: Normal range of motion.  Skin:    General: Skin is warm and dry.  Neurological:     Mental Status: He is alert and oriented to person, place, and time.     ED Results / Procedures / Treatments   Labs (all labs ordered are listed, but only abnormal results are displayed) Labs Reviewed  D-DIMER, QUANTITATIVE - Abnormal; Notable for the following components:      Result Value   D-Dimer, Quant 0.81 (*)    All other components within normal limits  BASIC METABOLIC PANEL - Abnormal; Notable for the following components:   Glucose, Bld 123 (*)    All other components within normal limits  RESP PANEL BY RT-PCR (FLU A&B, COVID) ARPGX2  CBC WITH DIFFERENTIAL/PLATELET  TROPONIN I (HIGH SENSITIVITY)  TROPONIN I (HIGH SENSITIVITY)    EKG None  Radiology DG Chest 1 View  Result Date: 04/21/2020 CLINICAL DATA:  Chest pain, shortness of breath EXAM: CHEST  1 VIEW COMPARISON:  08/15/2019 FINDINGS: Heart and mediastinal contours are within normal limits. No focal opacities or effusions. No acute bony abnormality. IMPRESSION: No active disease. Electronically Signed   By: Charlett Nose M.D.   On: 04/21/2020 18:45   CT Angio Chest PE W and/or Wo Contrast  Result Date: 04/21/2020 CLINICAL DATA:  Pulmonary embolus suspected with low to intermediate probability. Positive D-dimer. Chest pain EXAM: CT ANGIOGRAPHY CHEST WITH CONTRAST TECHNIQUE: Multidetector CT imaging of the chest was performed using the standard protocol  during bolus administration of intravenous contrast. Multiplanar CT image reconstructions and MIPs were obtained to evaluate the vascular anatomy. CONTRAST:  OMNIPAQUE IOHEXOL 350 MG/ML SOLN COMPARISON:  11/14/2018 FINDINGS: Cardiovascular: Limited contrast bolus with moderately good opacification of the central pulmonary arteries. No large central filling defects demonstrated suggesting no central pulmonary embolus. Peripheral vessels are nondiagnostic. Normal caliber thoracic aorta. No aortic dissection. Scattered aortic calcification. Normal heart size. No pericardial effusions. Mediastinum/Nodes: No enlarged mediastinal, hilar, or axillary lymph nodes. Thyroid gland, trachea, and esophagus demonstrate no significant findings. Lungs/Pleura: Lungs are clear. No pleural effusion or pneumothorax. Upper Abdomen: No acute abnormality. Musculoskeletal: Mild scoliosis at the cervicothoracic junction, convex  towards the left. Degenerative changes throughout the thoracic spine with bridging right lateral osteophytes. Review of the MIP images confirms the above findings. IMPRESSION: 1. Limited contrast bolus with moderately good opacification of the central pulmonary arteries. No large central pulmonary embolus demonstrated. Peripheral vessels are nondiagnostic. 2. No evidence of active pulmonary disease. 3. Aortic atherosclerosis. Aortic Atherosclerosis (ICD10-I70.0). Electronically Signed   By: Burman Nieves M.D.   On: 04/21/2020 19:21    Procedures Procedures   Medications Ordered in ED Medications  iohexol (OMNIPAQUE) 350 MG/ML injection 100 mL (100 mLs Intravenous Contrast Given 04/21/20 1851)    ED Course  I have reviewed the triage vital signs and the nursing notes.  Pertinent labs & imaging results that were available during my care of the patient were reviewed by me and considered in my medical decision making (see chart for details).  Clinical Course as of 04/21/20 1934  Wed Apr 21, 2020   1800 D-Dimer, Sharene Butters(!): 0.81 [JS]    Clinical Course User Index [JS] Claude Manges, [place]   MDM Rules/Calculators/A&P     Patient with a past medical history hypertension presents to the ED with a chief complaint of cough, sore throat, viral URI and pleuritic chest pain.  Reports symptoms have been ongoing for the past 2 days, taking some over-the-counter medication without much improvement in his symptoms.  Pain and shortness of breath is worsened at night especially with lying down.  No prior history of CHF, denies being on any medication for this.  Does report he has had a thick clear cough with some sputum.  Also endorses nausea which was exacerbated by the phlegm, this has now cleared.  Vitals are within normal limits on arrival without any tachycardia but does have oxygen saturation along the 97% with a good waveform.  During evaluation he is overall nonill, nontoxic-appearing, oropharynx is erythematous, uvula is midline without any exudates or PTA.  Chest is nontender to palpation, does describe the pain as left-sided sharp feeling like a burning sensation.  No prior history of reflux.  States this is exacerbated with deep inspiration.  D-dimer level was ordered in triage, this was found to be elevated.  We discussed CT angio chest to further rule out pulmonary embolism.  Covid testing, flu test were negative in nature.  BMP without any electrolyte derangement, current levels within normal limits.  CBC without leukocytosis.  His first troponin is 3, have a lower suspicion for ACS, does have a history of high blood pressure but no prior history of CAD, denies any tobacco use, family history of CAD.  EKG,  Chest x-ray without signs of pneumonia, pleural effusion, pneumothorax.  CT Angio showed: 1. Limited contrast bolus with moderately good opacification of the  central pulmonary arteries. No large central pulmonary embolus  demonstrated. Peripheral vessels are nondiagnostic.  2. No  evidence of active pulmonary disease.  3. Aortic atherosclerosis.     These results were discussed at length with patient, he is aware we will initiate symptomatic control with Delsym and Robitussin.  He was given the [facility] health and wellness clinic in order to obtain PCP care.  Patient hemodynamically stable, stable for discharge.  Return precautions discussed at length.   Portions of this note were generated with Scientist, clinical (histocompatibility and immunogenetics). Dictation errors may occur despite best attempts at proofreading.  Final Clinical Impression(s) / ED Diagnoses Final diagnoses:  Cough  Upper respiratory tract infection, unspecified type    Rx / DC Orders ED Discharge Orders  None       Claude MangesSoto, Amilcar Reever, Cordelia Poche-C 04/21/20 1934    Alvira MondaySchlossman, Erin, MD 04/22/20 1216

## 2020-04-21 NOTE — ED Triage Notes (Signed)
Pt c/o flu like sx day 3-NAD-steady gait 

## 2020-04-21 NOTE — ED Triage Notes (Signed)
Emergency Medicine Provider Triage Evaluation Note  Klinton Candelas , a 55 y.o. male  was evaluated in triage.  Pt complains of headache, cough, myalgias x2 days. No fever or chills. He admits to central chest pain worse with deep inspiration which he describes as a burning sensation. Shortness of breath worse at night. No lower extremity edema.  Denies history of blood clots, recent surgeries, recent long immobilization, and hormonal treatments. He also admit to a few episodes of hemoptysis.  He has received his COVID-19 vaccine and booster shot.   Review of Systems  Positive: Myalgias, chest pain, shortness of breath Negative:  Physical Exam  BP (!) 157/87 (BP Location: Right Arm)   Pulse 87   Temp 98.6 F (37 C) (Oral)   Resp 20   SpO2 95%  Gen:   Awake, no distress   HEENT:  Atraumatic Resp:  Normal effort  Cardiac:  Normal rate  Abd:   Nondistended, nontender  MSK:   Moves extremities without difficulty  Neuro:  Speech clear   Medical Decision Making  Medically screening exam initiated at 5:13 PM.  Appropriate orders placed.  Tiffany Tomasello was informed that the remainder of the evaluation will be completed by another provider, this initial triage assessment does not replace that evaluation, and the importance of remaining in the ED until their evaluation is complete.  Clinical Impression  Headache, myalgias, body aches likely viral etiology; however, also complaining of chest pain worse with deep inspiration and hemoptysis. D-dimer and cardiac labs ordered.    Mannie Stabile, [place] 04/21/20 1721

## 2020-04-21 NOTE — Discharge Instructions (Addendum)
Your laboratory results are within normal limits today.  We discussed the results of the CT angio.  You may continue to treat your likely viral infection with symptomatic control such as Delsym, or Robitussin to help with cough.  If you experience any fever, worsening chest pain please return to the emergency department.

## 2020-09-29 DIAGNOSIS — N529 Male erectile dysfunction, unspecified: Secondary | ICD-10-CM | POA: Insufficient documentation

## 2020-12-31 DIAGNOSIS — E119 Type 2 diabetes mellitus without complications: Secondary | ICD-10-CM | POA: Insufficient documentation

## 2021-02-17 ENCOUNTER — Emergency Department (HOSPITAL_BASED_OUTPATIENT_CLINIC_OR_DEPARTMENT_OTHER)
Admission: EM | Admit: 2021-02-17 | Discharge: 2021-02-17 | Disposition: A | Discharge status: Home / Self Care | Payer: 59 | Attending: Emergency Medicine | Admitting: Emergency Medicine

## 2021-02-17 ENCOUNTER — Other Ambulatory Visit: Payer: Self-pay

## 2021-02-17 ENCOUNTER — Encounter (HOSPITAL_BASED_OUTPATIENT_CLINIC_OR_DEPARTMENT_OTHER): Payer: Self-pay | Admitting: Emergency Medicine

## 2021-02-17 DIAGNOSIS — U071 COVID-19: Secondary | ICD-10-CM | POA: Insufficient documentation

## 2021-02-17 DIAGNOSIS — R9431 Abnormal electrocardiogram [ECG] [EKG]: Secondary | ICD-10-CM | POA: Diagnosis not present

## 2021-02-17 DIAGNOSIS — R059 Cough, unspecified: Secondary | ICD-10-CM | POA: Diagnosis present

## 2021-02-17 HISTORY — DX: Hyperlipidemia, unspecified: E78.5

## 2021-02-17 HISTORY — DX: Herpesviral infection, unspecified: B00.9

## 2021-02-17 NOTE — ED Provider Notes (Signed)
[facility]  Provider Note  CSN: [id number] Arrival date & time: 02/17/21 1004  History Chief Complaint  Patient presents with   Abnormal EKG    Randy Rose is a 56 y.o. male was diagnosed with Covid at a new patient appointment at [facility] 4 days ago. He had an EKG done that day and was told he 'had a heart attack at some point in his life'. He has never had a heart attack and has not had any chest pains. He had a mild cough but no SOB or fever. He was back there today and they told him he was not breathing well although he denies he was having any symptoms. He was sent to the ED but he is not sure why.    Home Medications Prior to Admission medications   Medication Sig Start Date End Date Taking? Authorizing Provider  albuterol (VENTOLIN HFA) 108 (90 Base) MCG/ACT inhaler  06/09/19   [provider]  amLODipine (NORVASC) 10 MG tablet Take 1 tablet (10 mg total) by mouth daily. 08/17/19 09/16/19  Little Ishikawa, MD  clindamycin (CLEOCIN) 300 MG capsule Take 1 capsule (300 mg total) by mouth 4 (four) times daily. X 7 days 09/11/19   Noe Gens, PA-C  Colchicine (MITIGARE) 0.6 MG CAPS Take 0.6 mg by mouth in the morning and at bedtime. 08/17/19 09/16/19  Little Ishikawa, MD  hydrOXYzine (VISTARIL) 50 MG capsule Take 50 mg by mouth 2 (two) times daily as needed. 05/23/19   [provider]  ibuprofen (ADVIL) 600 MG tablet Take 600 mg by mouth 3 (three) times daily. 06/09/19   [provider]  valACYclovir (VALTREX) 1000 MG tablet Take 1,000 mg by mouth 2 (two) times daily. 06/09/19   [provider]  vortioxetine HBr (TRINTELLIX) 10 MG TABS tablet Take 10 mg by mouth daily.    [provider]     Allergies    Patient has no known allergies.   Review of Systems   Review of Systems Please see HPI for pertinent positives and negatives  Physical Exam BP (!) 172/102 (BP Location: Right Arm)    Pulse 87     Temp 98.3 F (36.8 C) (Oral)    Resp 20    Ht 6\' 1"  (1.854 m)    Wt 118.8 kg    SpO2 97%    BMI 34.57 kg/m   Physical Exam Vitals and nursing note reviewed.  Constitutional:      Appearance: Normal appearance.  HENT:     Head: Normocephalic and atraumatic.     Nose: Nose normal.     Mouth/Throat:     Mouth: Mucous membranes are moist.  Eyes:     Extraocular Movements: Extraocular movements intact.     Conjunctiva/sclera: Conjunctivae normal.  Cardiovascular:     Rate and Rhythm: Normal rate.  Pulmonary:     Effort: Pulmonary effort is normal.     Breath sounds: Normal breath sounds.  Abdominal:     General: Abdomen is flat.     Palpations: Abdomen is soft.     Tenderness: There is no abdominal tenderness.  Musculoskeletal:        General: No swelling. Normal range of motion.     Cervical back: Neck supple.  Skin:    General: Skin is warm and dry.  Neurological:     General: No focal deficit present.     Mental Status: He is alert.  Psychiatric:  Mood and Affect: Mood normal.    ED Results / Procedures / Treatments   EKG EKG Interpretation  Date/Time:  Thursday February 17 2021 10:12:55 EST Ventricular Rate:  79 PR Interval:  162 QRS Duration: 86 QT Interval:  336 QTC Calculation: 385 R Axis:   41 Text Interpretation: Sinus rhythm with sinus arrhythmia with occasional Premature ventricular complexes Nonspecific T wave abnormality Abnormal ECG When compared with ECG of 21-Apr-2020 17:33, No significant change since last tracing Confirmed by Calvert Cantor [phone]) on 02/17/2021 10:38:35 AM  Procedures Procedures  Medications Ordered in the ED Medications - No data to display  Initial Impression and Plan Patient with reportedly abnormal outpatient EKG has had no symptoms. He does not know why he was sent here. His EKG today is abnormal but unchanged from previous. Patient would like to go home. He reports he will try to find a different PCP.   ED Course        MDM Rules/Calculators/A&P Medical Decision Making Problems Addressed: Abnormal EKG: chronic illness or injury COVID-19: acute illness or injury with systemic symptoms  Amount and/or Complexity of Data Reviewed ECG/medicine tests: ordered and independent interpretation performed. Decision-making details documented in ED Course.  Risk OTC drugs.    Final Clinical Impression(s) / ED Diagnoses Final diagnoses:  COVID-19  Abnormal EKG    Rx / DC Orders ED Discharge Orders     None        Truddie Hidden, MD 02/17/21 1051

## 2021-02-17 NOTE — ED Triage Notes (Signed)
Pt sts he was referred here for further eval by PCP d/t EKG at his physical today indicated possible hx MI; pt denies any sxs

## 2021-02-17 NOTE — ED Notes (Signed)
To Triage Room 1 for ECG

## 2021-02-18 IMAGING — DX DG CHEST 1V PORT
1 series · 1 of 1 positions shown · non-contrast
Comparison: September 29, 2017

CLINICAL DATA: Mid back pain.

EXAM:
PORTABLE CHEST 1 VIEW

[chest ap]
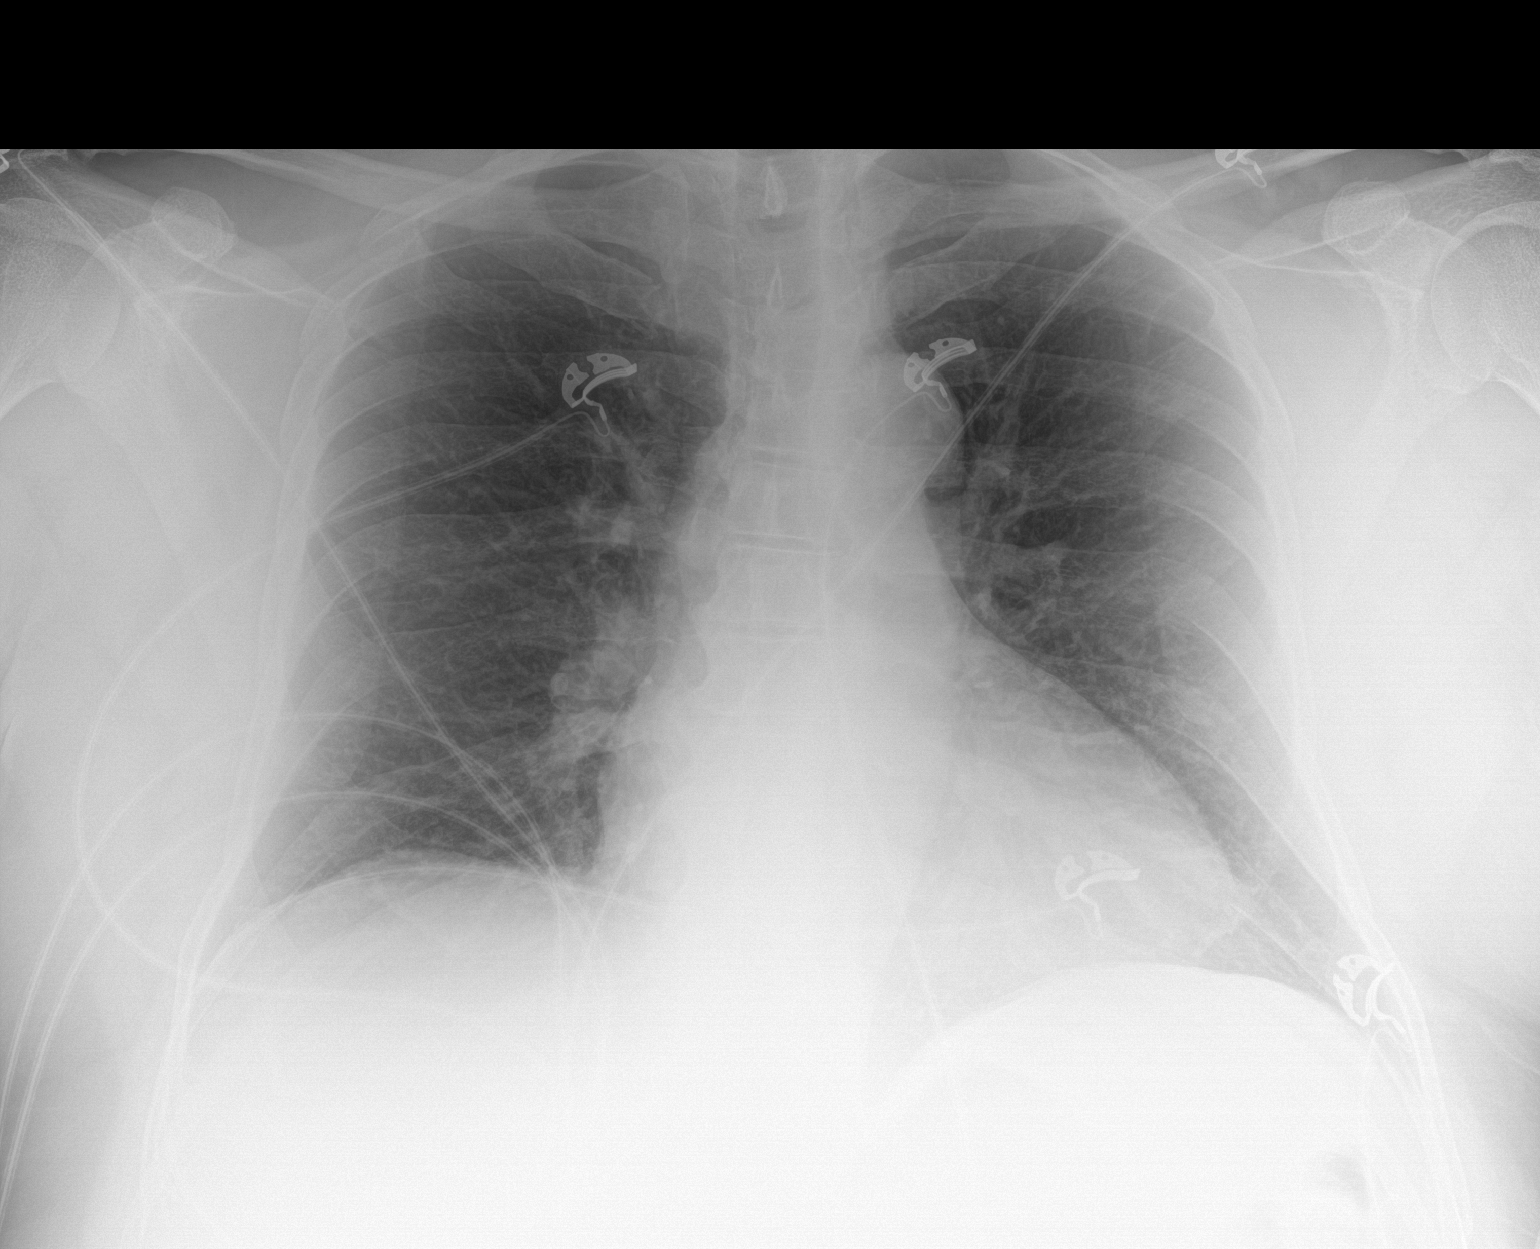

[1 of 1 positions shown; findings below may reference images not displayed]

FINDINGS: Cardiomediastinal silhouette is normal. Mediastinal contours appear
intact. Mild tortuosity of the aorta.

There is no evidence of focal airspace consolidation, pleural
effusion or pneumothorax.

Osseous structures are without acute abnormality. Soft tissues are
grossly normal.
IMPRESSION: No active disease.

## 2021-03-07 IMAGING — DX DG CHEST 1V PORT
2 series · 2 of 2 positions shown · non-contrast
Comparison: Chest x-ray dated October 28, 2018.

CLINICAL DATA: Chest pain and intermittent shortness of breath over
the past few weeks.

EXAM:
PORTABLE CHEST 1 VIEW

[chest ap (1 of 2)]
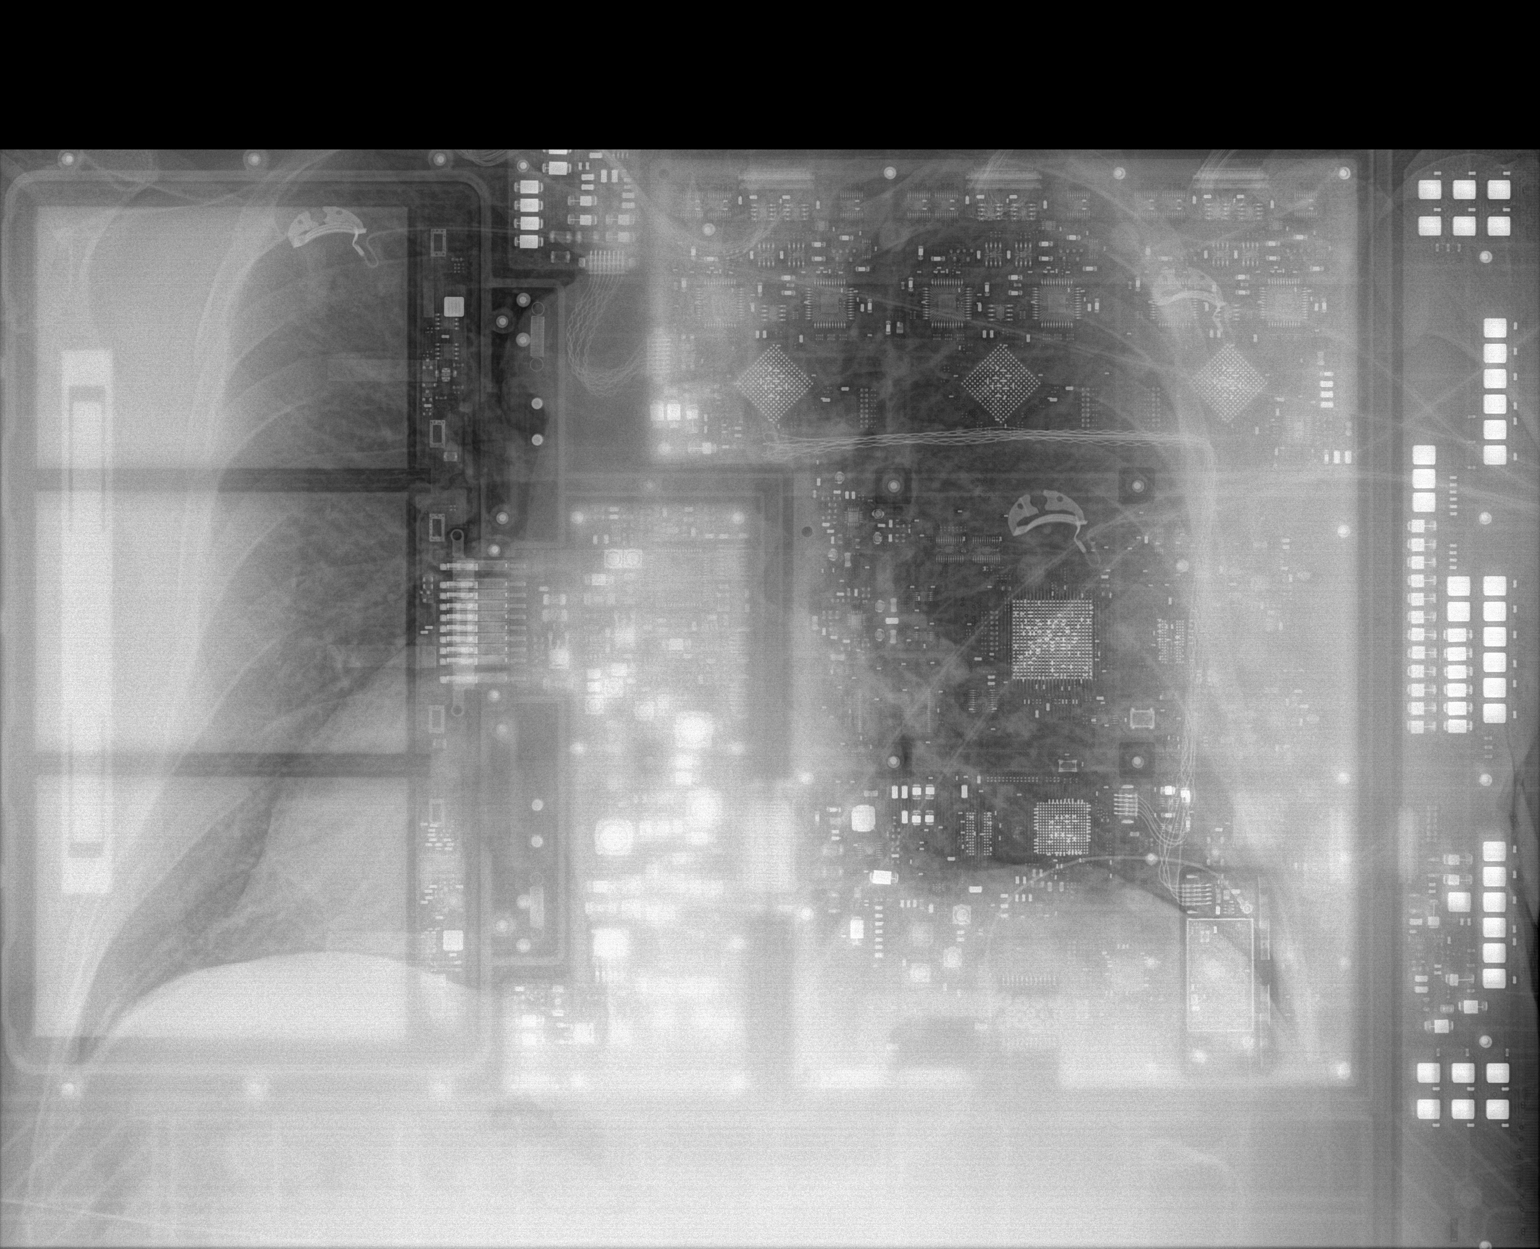

[chest ap (2 of 2)]
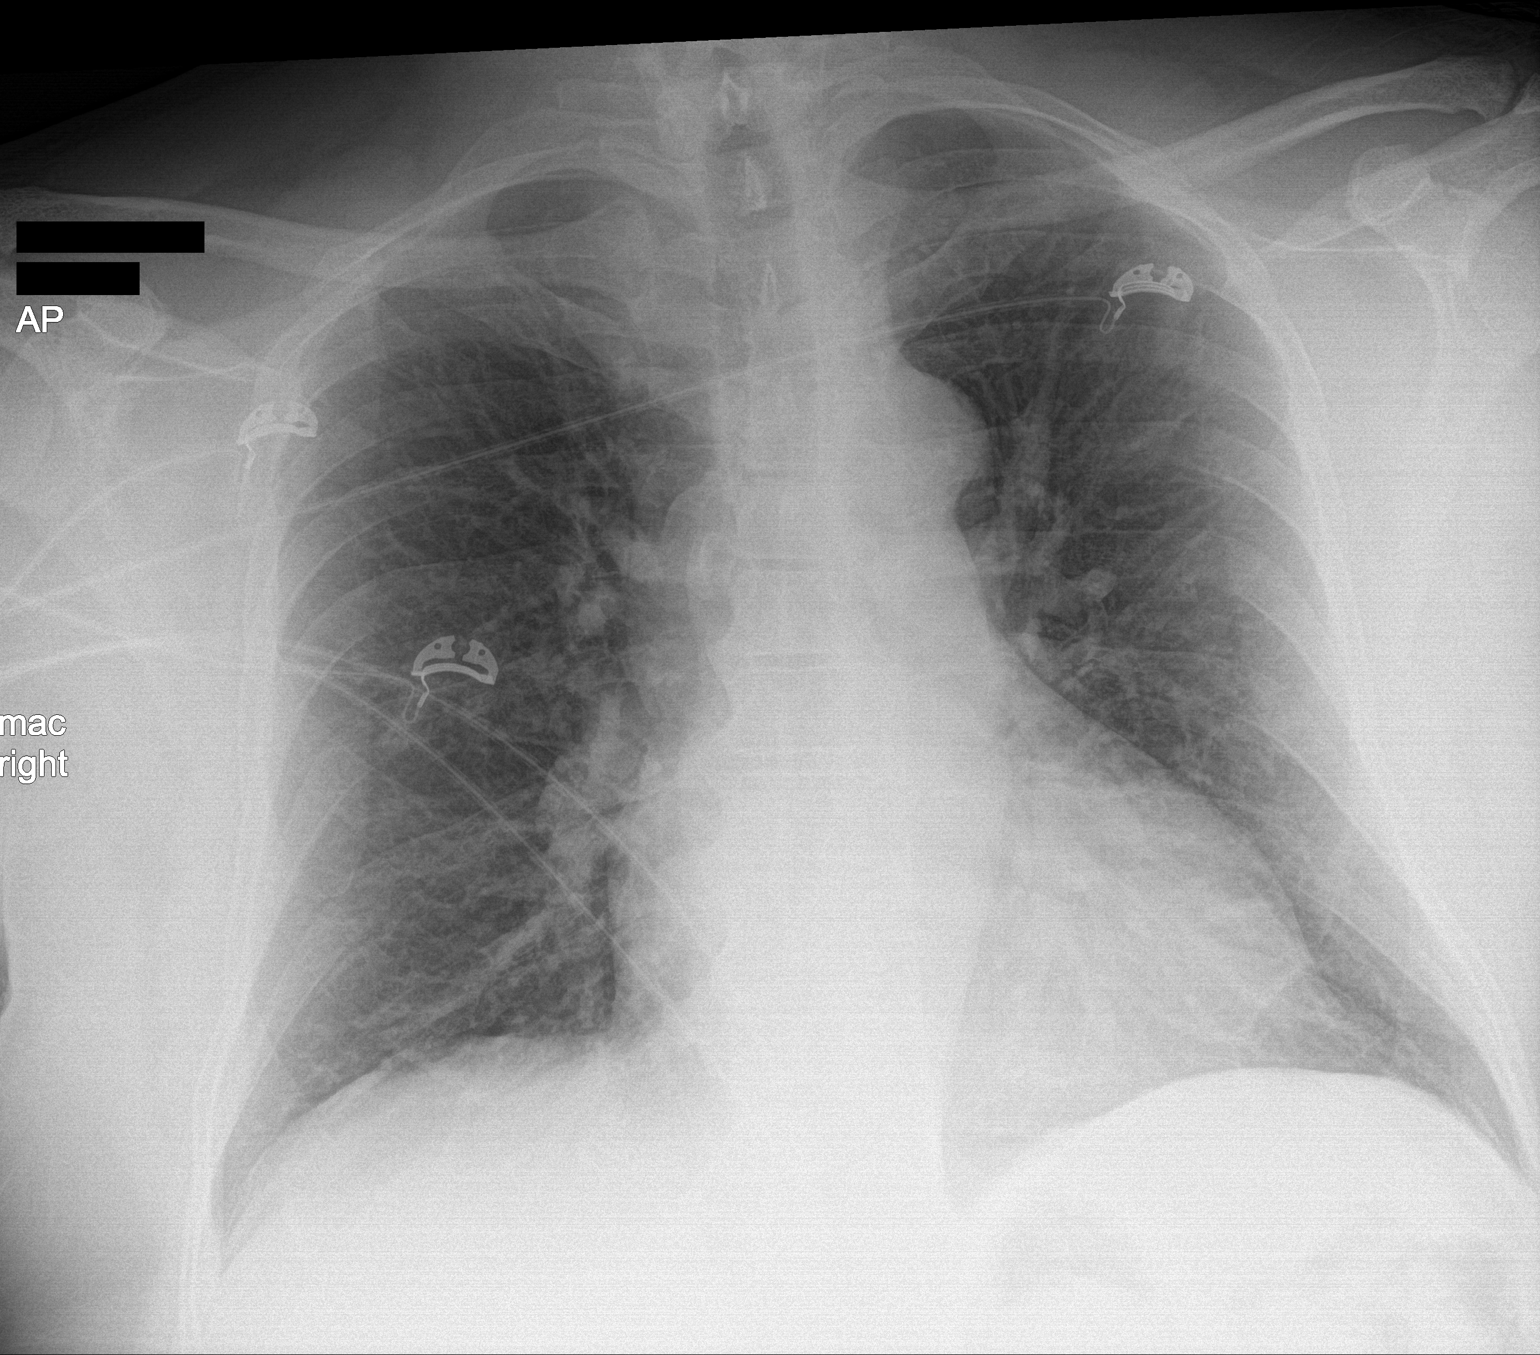

[2 of 2 positions shown; findings below may reference images not displayed]

FINDINGS: The heart size and mediastinal contours are within normal limits.
Both lungs are clear. The visualized skeletal structures are
unremarkable.
IMPRESSION: No active disease.

## 2021-04-26 ENCOUNTER — Emergency Department (HOSPITAL_BASED_OUTPATIENT_CLINIC_OR_DEPARTMENT_OTHER)
Admission: EM | Admit: 2021-04-26 | Discharge: 2021-04-26 | Disposition: A | Discharge status: Home / Self Care | Payer: 59 | Attending: Emergency Medicine | Admitting: Emergency Medicine

## 2021-04-26 ENCOUNTER — Encounter (HOSPITAL_BASED_OUTPATIENT_CLINIC_OR_DEPARTMENT_OTHER): Payer: Self-pay | Admitting: Emergency Medicine

## 2021-04-26 ENCOUNTER — Other Ambulatory Visit: Payer: Self-pay

## 2021-04-26 DIAGNOSIS — R059 Cough, unspecified: Secondary | ICD-10-CM | POA: Diagnosis present

## 2021-04-26 DIAGNOSIS — Z20822 Contact with and (suspected) exposure to covid-19: Secondary | ICD-10-CM | POA: Insufficient documentation

## 2021-04-26 DIAGNOSIS — Z79899 Other long term (current) drug therapy: Secondary | ICD-10-CM | POA: Insufficient documentation

## 2021-04-26 DIAGNOSIS — B349 Viral infection, unspecified: Secondary | ICD-10-CM | POA: Insufficient documentation

## 2021-04-26 LAB — RESP PANEL BY RT-PCR (FLU A&B, COVID) ARPGX2
Influenza A by PCR: NEGATIVE
Influenza B by PCR: NEGATIVE
SARS Coronavirus 2 by RT PCR: NEGATIVE

## 2021-04-26 NOTE — ED Triage Notes (Signed)
Pt states he feels like he may have covid  Pt is c/o headache, body aches, loss of taste and smell, chills and night sweats  Pt states his sxs started yesterday  Pt works in the hospital setting   ?

## 2021-04-26 NOTE — ED Provider Notes (Signed)
? ?[facility]  ?Provider Note ? ?CSN: [id number] ?Arrival date & time: 04/26/21 0032 ? ?History ?Chief Complaint  ?Patient presents with  ? flu like symptoms   ? ? ?Randy Rose is a 56 y.o. male reports he has had 2-3 days of night sweats, chills, nausea, and a mild cough. He is concerned he might have Covid because this is similar to symptoms he had in Feb when he tested positive. Otherwise he is doing well and at baseline.  ? ? ?Home Medications ?Prior to Admission medications   ?Medication Sig Start Date End Date Taking? Authorizing Provider  ?albuterol (VENTOLIN HFA) 108 (90 Base) MCG/ACT inhaler  06/09/19   [provider]  ?amLODipine (NORVASC) 10 MG tablet Take 1 tablet (10 mg total) by mouth daily. 08/17/19 09/16/19  Azucena Fallen, MD  ?clindamycin (CLEOCIN) 300 MG capsule Take 1 capsule (300 mg total) by mouth 4 (four) times daily. X 7 days 09/11/19   Lurene Shadow, PA-C  ?Colchicine (MITIGARE) 0.6 MG CAPS Take 0.6 mg by mouth in the morning and at bedtime. 08/17/19 09/16/19  Azucena Fallen, MD  ?hydrOXYzine (VISTARIL) 50 MG capsule Take 50 mg by mouth 2 (two) times daily as needed. 05/23/19   [provider]  ?ibuprofen (ADVIL) 600 MG tablet Take 600 mg by mouth 3 (three) times daily. 06/09/19   [provider]  ?valACYclovir (VALTREX) 1000 MG tablet Take 1,000 mg by mouth 2 (two) times daily. 06/09/19   [provider]  ?vortioxetine HBr (TRINTELLIX) 10 MG TABS tablet Take 10 mg by mouth daily.    [provider]  ? ? ? ?Allergies    ?Patient has no known allergies. ? ? ?Review of Systems   ?Review of Systems ?Please see HPI for pertinent positives and negatives ? ?Physical Exam ?BP (!) 147/94 (BP Location: Left Arm)   Pulse 94   Temp 98.2 ?F (36.8 ?C) (Oral)   Resp 16   Ht 6\' 1"  (1.854 m)   Wt 117.9 kg   SpO2 98%   BMI 34.30 kg/m?  ? ?Physical Exam ?Vitals and nursing note reviewed.  ?Constitutional:   ?   Appearance:  Normal appearance.  ?HENT:  ?   Head: Normocephalic and atraumatic.  ?   Nose: Nose normal.  ?   Mouth/Throat:  ?   Mouth: Mucous membranes are moist.  ?Eyes:  ?   Extraocular Movements: Extraocular movements intact.  ?   Conjunctiva/sclera: Conjunctivae normal.  ?Cardiovascular:  ?   Rate and Rhythm: Normal rate.  ?Pulmonary:  ?   Effort: Pulmonary effort is normal.  ?   Breath sounds: Normal breath sounds.  ?Abdominal:  ?   General: Abdomen is flat.  ?   Palpations: Abdomen is soft.  ?   Tenderness: There is no abdominal tenderness.  ?Musculoskeletal:     ?   General: No swelling. Normal range of motion.  ?   Cervical back: Neck supple.  ?Skin: ?   General: Skin is warm and dry.  ?Neurological:  ?   General: No focal deficit present.  ?   Mental Status: He is alert.  ?Psychiatric:     ?   Mood and Affect: Mood normal.  ? ? ?ED Results / Procedures / Treatments   ?EKG ?None ? ?Procedures ?Procedures ? ?Medications Ordered in the ED ?Medications - No data to display ? ?Initial Impression and Plan ? Patient is well appearing, mild viral symptoms. Covid/Flu are neg.  Patient reassured. Recommend he continue with OTC symptomatic treatment and follow up with PCP if not improving.  ? ?ED Course  ? ?  ? ? ?MDM Rules/Calculators/A&P ?Medical Decision Making ?Problems Addressed: ?Viral syndrome: acute illness or injury ? ?Amount and/or Complexity of Data Reviewed ?Labs: ordered. Decision-making details documented in ED Course. ? ?Risk ?OTC drugs. ? ? ? ?Final Clinical Impression(s) / ED Diagnoses ?Final diagnoses:  ?Viral syndrome  ? ? ?Rx / DC Orders ?ED Discharge Orders   ? ? None  ? ?  ? ?  ?Pollyann Savoy, MD ?04/26/21 [phone] ? ?

## 2021-06-17 ENCOUNTER — Emergency Department (HOSPITAL_BASED_OUTPATIENT_CLINIC_OR_DEPARTMENT_OTHER): Payer: 59

## 2021-06-17 ENCOUNTER — Other Ambulatory Visit (HOSPITAL_BASED_OUTPATIENT_CLINIC_OR_DEPARTMENT_OTHER): Payer: Self-pay

## 2021-06-17 ENCOUNTER — Other Ambulatory Visit: Payer: Self-pay

## 2021-06-17 ENCOUNTER — Emergency Department (HOSPITAL_BASED_OUTPATIENT_CLINIC_OR_DEPARTMENT_OTHER)
Admission: EM | Admit: 2021-06-17 | Discharge: 2021-06-17 | Disposition: A | Discharge status: Home / Self Care | Payer: 59 | Attending: Emergency Medicine | Admitting: Emergency Medicine

## 2021-06-17 DIAGNOSIS — M79662 Pain in left lower leg: Secondary | ICD-10-CM | POA: Insufficient documentation

## 2021-06-17 DIAGNOSIS — R059 Cough, unspecified: Secondary | ICD-10-CM | POA: Diagnosis present

## 2021-06-17 DIAGNOSIS — J029 Acute pharyngitis, unspecified: Secondary | ICD-10-CM | POA: Diagnosis not present

## 2021-06-17 DIAGNOSIS — J40 Bronchitis, not specified as acute or chronic: Secondary | ICD-10-CM | POA: Insufficient documentation

## 2021-06-17 DIAGNOSIS — Z20822 Contact with and (suspected) exposure to covid-19: Secondary | ICD-10-CM | POA: Diagnosis not present

## 2021-06-17 LAB — CBC WITH DIFFERENTIAL/PLATELET
Abs Immature Granulocytes: 0.01 10*3/uL (ref 0.00–0.07)
Basophils Absolute: 0 10*3/uL (ref 0.0–0.1)
Basophils Relative: 1 %
Eosinophils Absolute: 0.1 10*3/uL (ref 0.0–0.5)
Eosinophils Relative: 1 %
HCT: 47.5 % (ref 39.0–52.0)
Hemoglobin: 15.6 g/dL (ref 13.0–17.0)
Immature Granulocytes: 0 %
Lymphocytes Relative: 52 %
Lymphs Abs: 2.8 10*3/uL (ref 0.7–4.0)
MCH: 28.3 pg (ref 26.0–34.0)
MCHC: 32.8 g/dL (ref 30.0–36.0)
MCV: 86.2 fL (ref 80.0–100.0)
Monocytes Absolute: 0.4 10*3/uL (ref 0.1–1.0)
Monocytes Relative: 6 %
Neutro Abs: 2.3 10*3/uL (ref 1.7–7.7)
Neutrophils Relative %: 40 %
Platelets: 236 10*3/uL (ref 150–400)
RBC: 5.51 MIL/uL (ref 4.22–5.81)
RDW: 13.5 % (ref 11.5–15.5)
WBC: 5.6 10*3/uL (ref 4.0–10.5)
nRBC: 0 % (ref 0.0–0.2)

## 2021-06-17 LAB — BASIC METABOLIC PANEL
Anion gap: 5 (ref 5–15)
BUN: 15 mg/dL (ref 6–20)
CO2: 30 mmol/L (ref 22–32)
Calcium: 9.4 mg/dL (ref 8.9–10.3)
Chloride: 106 mmol/L (ref 98–111)
Creatinine, Ser: 0.94 mg/dL (ref 0.61–1.24)
GFR, Estimated: >60 mL/min (ref >60)
Glucose, Bld: 104 mg/dL — ABNORMAL HIGH (ref 70–99)
Potassium: 4 mmol/L (ref 3.5–5.1)
Sodium: 141 mmol/L (ref 135–145)

## 2021-06-17 LAB — RESP PANEL BY RT-PCR (FLU A&B, COVID) ARPGX2
Influenza A by PCR: NEGATIVE
Influenza B by PCR: NEGATIVE
SARS Coronavirus 2 by RT PCR: NEGATIVE

## 2021-06-17 LAB — D-DIMER, QUANTITATIVE: D-Dimer, Quant: 0.54 ug/mL-FEU — ABNORMAL HIGH (ref 0.00–0.50)

## 2021-06-17 MED ORDER — PROMETHAZINE-DM 6.25-15 MG/5ML PO SYRP
5.0000 mL | ORAL_SOLUTION | Freq: Four times a day (QID) | ORAL | 0 refills | Status: AC | PRN
  Filled 2021-06-17: qty 100, 5d supply, fill #0

## 2021-06-17 MED ORDER — DEXAMETHASONE 10 MG/ML FOR PEDIATRIC ORAL USE
10.0000 mg | Freq: Once | INTRAMUSCULAR | Status: AC
  Administered 2021-06-17: 10 mg via ORAL
  Filled 2021-06-17: qty 1

## 2021-06-17 NOTE — Discharge Instructions (Addendum)
Your work-up today was reassuring.  You likely have bronchitis.  This is typically viral and will get better on its own.  I prescribed you cough medication to use as needed.  You been given a oral steroid prior to discharge. Please follow up with your PCP.  Please return if you develop shortness of breath or worsening chest tightness.

## 2021-06-17 NOTE — ED Triage Notes (Signed)
Patient presents to ED via POV from home. Patient expresses concerns that he may have COVID. 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 Patient also wants to be evaluated for his left leg. Expresses concerns there is a clot but also report left knee pain. Denies any injury or trauma to leg. Ambulatory.

## 2021-06-17 NOTE — ED Provider Notes (Signed)
[facility] Provider Note   CSN: [id number] Arrival date & time: 06/17/21  1243     History PMH: HTN, HLD, hx CVA Chief Complaint  Patient presents with   Multiple Complaints    Randy Rose is a 56 y.o. male. Presents with multiple complaints.  Ever since Tuesday, he has had a cough productive cough with yellow sputum, sore throat feeling hot and cold, and having generalized body aches.  He also complains of a tension headache and increased fatigue.  He says last time he felt like this he had COVID so he is concerned about that.  He says his chest feels really congested and he has a mid chest tightness that started yesterday when he takes in deep breaths.  This pain does not radiate.  It is only present with deep breaths.  He has mild shortness of breath.  Denies being around anyone else that is sick. He also states that he has been having issues with his left calf.  He says ever since Tuesday he has noticed posterior calf pain and tenderness.  He says he has intermittent swelling that is worse after work.  He says he sometimes has numbness going down the back of his calf.  He denies any history of blood clots.  He is not on any anticoagulation.  He had no recent travel or recent surgeries.  No hemoptysis.  HPI     Home Medications Prior to Admission medications   Medication Sig Start Date End Date Taking? Authorizing Provider  promethazine-dextromethorphan (PROMETHAZINE-DM) 6.25-15 MG/5ML syrup Take 5 mLs by mouth 4 (four) times daily as needed for up to 5 days for cough. 06/17/21 06/22/21 Yes Levy Cedano, Finis Bud, PA-C  albuterol (VENTOLIN HFA) 108 (90 Base) MCG/ACT inhaler  06/09/19   [provider]  amLODipine (NORVASC) 10 MG tablet Take 1 tablet (10 mg total) by mouth daily. 08/17/19 09/16/19  Azucena Fallen, MD  clindamycin (CLEOCIN) 300 MG capsule Take 1 capsule (300 mg total) by mouth 4 (four) times daily. X 7 days 09/11/19   Lurene Shadow,  PA-C  Colchicine (MITIGARE) 0.6 MG CAPS Take 0.6 mg by mouth in the morning and at bedtime. 08/17/19 09/16/19  Azucena Fallen, MD  hydrOXYzine (VISTARIL) 50 MG capsule Take 50 mg by mouth 2 (two) times daily as needed. 05/23/19   [provider]  ibuprofen (ADVIL) 600 MG tablet Take 600 mg by mouth 3 (three) times daily. 06/09/19   [provider]  valACYclovir (VALTREX) 1000 MG tablet Take 1,000 mg by mouth 2 (two) times daily. 06/09/19   [provider]  vortioxetine HBr (TRINTELLIX) 10 MG TABS tablet Take 10 mg by mouth daily.    [provider]      Allergies    Patient has no known allergies.    Review of Systems   Review of Systems  Constitutional:  Positive for chills. Negative for fever.  HENT:  Positive for congestion and sore throat.   Respiratory:  Positive for cough and chest tightness. Negative for shortness of breath and wheezing.   Cardiovascular:  Positive for leg swelling.  Gastrointestinal:  Negative for abdominal pain, constipation, diarrhea, nausea and vomiting.  Musculoskeletal:  Positive for myalgias.  All other systems reviewed and are negative.  Physical Exam Updated Vital Signs BP 127/90 (BP Location: Right Arm)   Pulse 84   Temp 98.6 F (37 C) (Oral)   Resp 18   Ht  (1.854 m)  Wt 117.9 kg   SpO2 96%   BMI 34.30 kg/m  Physical Exam Vitals and nursing note reviewed.  Constitutional:      General: He is not in acute distress.    Appearance: Normal appearance. He is not ill-appearing, toxic-appearing or diaphoretic.  HENT:     Head: Normocephalic and atraumatic.     Right Ear: Tympanic membrane, ear canal and external ear normal. There is no impacted cerumen.     Left Ear: Tympanic membrane, ear canal and external ear normal. There is no impacted cerumen.     Nose: Congestion present. No nasal deformity.     Mouth/Throat:     Lips: Pink. No lesions.     Mouth: Mucous membranes are moist. No injury,  lacerations, oral lesions or angioedema.     Pharynx: Oropharynx is clear. Uvula midline. No pharyngeal swelling, oropharyngeal exudate, posterior oropharyngeal erythema or uvula swelling.     Comments: No evidence of peritonsillar abscess.  Tonsils are not swollen.  No exudates or erythema noted. Eyes:     General: Gaze aligned appropriately. No scleral icterus.       Right eye: No discharge.        Left eye: No discharge.     Conjunctiva/sclera: Conjunctivae normal.     Right eye: Right conjunctiva is not injected. No exudate or hemorrhage.    Left eye: Left conjunctiva is not injected. No exudate or hemorrhage. Cardiovascular:     Rate and Rhythm: Normal rate and regular rhythm.     Pulses: Normal pulses.          Radial pulses are 2+ on the right side and 2+ on the left side.       Dorsalis pedis pulses are 2+ on the right side and 2+ on the left side.     Heart sounds: Normal heart sounds, S1 normal and S2 normal. Heart sounds not distant. No murmur heard.   No friction rub. No gallop. No S3 or S4 sounds.  Pulmonary:     Effort: Pulmonary effort is normal. No accessory muscle usage or respiratory distress.     Breath sounds: Normal breath sounds. No stridor. No wheezing, rhonchi or rales.  Chest:     Chest wall: No tenderness.  Abdominal:     General: Abdomen is flat. There is no distension.     Palpations: Abdomen is soft. There is no mass or pulsatile mass.     Tenderness: There is no abdominal tenderness. There is no right CVA tenderness, left CVA tenderness, guarding or rebound.  Musculoskeletal:     Cervical back: Neck supple.     Right lower leg: No edema.     Left lower leg: No edema.     Comments: Noticed any asymmetrical leg swelling.  Patient does have some mild tenderness behind the left calf.  Lymphadenopathy:     Cervical: No cervical adenopathy.  Skin:    General: Skin is warm and dry.     Coloration: Skin is not jaundiced or pale.     Findings: No bruising,  erythema, lesion or rash.  Neurological:     General: No focal deficit present.     Mental Status: He is alert and oriented to person, place, and time.     GCS: GCS eye subscore is 4. GCS verbal subscore is 5. GCS motor subscore is 6.  Psychiatric:        Mood and Affect: Mood normal.        Behavior:  Behavior normal. Behavior is cooperative.    ED Results / Procedures / Treatments   Labs (all labs ordered are listed, but only abnormal results are displayed) Labs Reviewed  BASIC METABOLIC PANEL - Abnormal; Notable for the following components:      Result Value   Glucose, Bld 104 (*)    All other components within normal limits  D-DIMER, QUANTITATIVE - Abnormal; Notable for the following components:   D-Dimer, Quant 0.54 (*)    All other components within normal limits  RESP PANEL BY RT-PCR (FLU A&B, COVID) ARPGX2  CBC WITH DIFFERENTIAL/PLATELET    EKG EKG Interpretation  Date/Time:  Friday June 17 2021 13:45:41 EDT Ventricular Rate:  90 PR Interval:  160 QRS Duration: 94 QT Interval:  362 QTC Calculation: 443 R Axis:   45 Text Interpretation: Sinus rhythm RSR' in V1 or V2, right VCD or RVH Confirmed by Virgina Norfolk [phone]) on 06/17/2021 1:50:35 PM  Radiology DG Chest 2 View  Result Date: 06/17/2021 CLINICAL DATA:  Cough, fatigue, and shortness of breath. EXAM: CHEST - 2 VIEW COMPARISON:  Chest x-ray and CT chest dated April 21, 2020. FINDINGS: The heart size and mediastinal contours are within normal limits. Both lungs are clear. The visualized skeletal structures are unremarkable. IMPRESSION: No active cardiopulmonary disease. Electronically Signed   By: Obie Dredge M.D.   On: 06/17/2021 13:57   US Venous Img Lower  Left (DVT Study)  Result Date: 06/17/2021 CLINICAL DATA:  Pain EXAM: LEFT LOWER EXTREMITY VENOUS DOPPLER ULTRASOUND TECHNIQUE: Gray-scale sonography with compression, as well as color and duplex ultrasound, were performed to evaluate the deep venous system(s)  from the level of the common femoral vein through the popliteal and proximal calf veins. COMPARISON:  None Available. FINDINGS: VENOUS Normal compressibility of the common femoral, superficial femoral, and popliteal veins, as well as the visualized calf veins. Visualized portions of profunda femoral vein and great saphenous vein unremarkable. No filling defects to suggest DVT on grayscale or color Doppler imaging. Doppler waveforms show normal direction of venous flow, normal respiratory plasticity and response to augmentation. Limited views of the contralateral common femoral vein are unremarkable. OTHER None. Limitations: none IMPRESSION: Negative. Electronically Signed   By: Corlis Leak M.D.   On: 06/17/2021 14:06    Procedures Procedures    Medications Ordered in ED Medications  dexamethasone (DECADRON) 10 MG/ML injection for Pediatric ORAL use 10 mg (has no administration in time range)    ED Course/ Medical Decision Making/ A&P                            Medical Decision Making Amount and/or Complexity of Data Reviewed Labs: ordered. Radiology: ordered.  Risk Prescription drug management.    MDM  This is a 56 y.o. male who presents to the ED with URI symptoms and left calf pain The differential of this patient includes but is not limited to DVT, PE, bronchitis, PNA, viral URI, ACS  My Impression, Plan, and ED Course: Is a well-appearing male with no acute distress.  He is afebrile and vitals are hemodynamically stable. He has had 3 days of upper respiratory symptoms and now associated chest tightness that seems to be pleuritic.  He also has some left calf pain started around the same time  I think that this is likely a viral URI, but with this posterior calf pain started around the same time there is clinical concern that this could be  a PE.  His Wells score 3.  Will order D-dimer as well as regular labs to work this up.  Chest x-ray obtained to evaluate for possible pneumonia  however think this is less likely.  DVT US of left lower extremity obtained.  I personally ordered, reviewed, and interpreted all laboratory work and imaging and agree with radiologist interpretation. Results interpreted below:  EKG Sinus rhythm, RSR' in V1 or V2, right VCD or RVH, not ischemic, no comparison strip CXR with no signs of PNA, PTX, or acute abnormality DVT study negative CBC: no leukocytosis, anemia, or acute abormality BMP: Normal D dimer 0.54 which is normal for his age-adjusted value COVID/flu negative  Low likelihood that this sis a PE with negative dimer and DVT study. Doubt ACS. Suspect viral bronchitis. Patient remains stable and in no acute distress. Will give dose of decadron here. Recommend antitussives and supportive treatments. Feel he is stable for discharge.    Charting Requirements Additional history is obtained from:  Independent historian External Records from outside source obtained and reviewed including: reviewed prior d dimer, cxr Social Determinants of Health:  none Pertinant PMH that complicates patient's illness: n/a  Patient Care Problems that were addressed during this visit: - Left calf pain: Acute illness - Bronchitis: Acute illness This patient was maintained on a cardiac monitor/telemetry. I personally viewed and interpreted the cardiac monitor which reveals an underlying rhythm of NSR Medications given in ED: Decadron Reevaluation of the patient after these medicines showed that the patient stayed the same I have reviewed home medications and made changes accordingly. Critical Care Interventions: n/a Consultations: n/a Disposition: discharge with pcp f/u  This is a supervised visit with my attending physician, Dr. Venida Jarvis. We have discussed this patient and they have altered the plan as needed.  Portions of this note were generated with Scientist, clinical (histocompatibility and immunogenetics). Dictation errors may occur despite best attempts at proofreading.       Final Clinical Impression(s) / ED Diagnoses Final diagnoses:  Pain of left calf  Bronchitis    Rx / DC Orders ED Discharge Orders          Ordered    promethazine-dextromethorphan (PROMETHAZINE-DM) 6.25-15 MG/5ML syrup  4 times daily PRN        06/17/21 1523              Shaylea Ucci, Finis Bud, PA-C 06/17/21 1532    Virgina Norfolk, DO 06/18/21 1018

## 2021-06-28 ENCOUNTER — Other Ambulatory Visit: Payer: Self-pay

## 2021-06-28 ENCOUNTER — Emergency Department (HOSPITAL_BASED_OUTPATIENT_CLINIC_OR_DEPARTMENT_OTHER)
Admission: EM | Admit: 2021-06-28 | Discharge: 2021-06-28 | Disposition: A | Discharge status: Home / Self Care | Payer: 59 | Attending: Emergency Medicine | Admitting: Emergency Medicine

## 2021-06-28 ENCOUNTER — Encounter (HOSPITAL_BASED_OUTPATIENT_CLINIC_OR_DEPARTMENT_OTHER): Payer: Self-pay | Admitting: Emergency Medicine

## 2021-06-28 DIAGNOSIS — M109 Gout, unspecified: Secondary | ICD-10-CM | POA: Diagnosis not present

## 2021-06-28 DIAGNOSIS — M79672 Pain in left foot: Secondary | ICD-10-CM | POA: Diagnosis not present

## 2021-06-28 DIAGNOSIS — M25562 Pain in left knee: Secondary | ICD-10-CM | POA: Diagnosis present

## 2021-06-28 MED ORDER — IBUPROFEN 600 MG PO TABS
600.0000 mg | ORAL_TABLET | Freq: Three times a day (TID) | ORAL | 0 refills | Status: DC
  Filled 2021-06-28: qty 30, 10d supply, fill #0

## 2021-06-28 MED ORDER — COLCHICINE 0.6 MG PO TABS
ORAL_TABLET | ORAL | 0 refills | Status: DC
  Filled 2021-06-28: qty 3, 1d supply, fill #0

## 2021-06-28 MED ORDER — IBUPROFEN 200 MG PO TABS
600.0000 mg | ORAL_TABLET | Freq: Once | ORAL | Status: AC
  Administered 2021-06-28: 600 mg via ORAL
  Filled 2021-06-28: qty 1

## 2021-06-28 NOTE — ED Triage Notes (Addendum)
Pt thinks gout flair up starting yesterday.  Hx of same left knee and foot.

## 2021-06-28 NOTE — ED Provider Notes (Signed)
[facility] EMERGENCY DEPARTMENT  Provider Note  CSN: [id number] Arrival date & time: 06/28/21 2238  History Chief Complaint  Patient presents with   Gout   Knee Pain   Foot Pain    Randy Rose is a 56 y.o. male reports one day of L knee and foot pain, similar to previous gout, worse with standing and bending. No fevers, no injuries. Not current on any gout medications.    Home Medications Prior to Admission medications   Medication Sig Start Date End Date Taking? Authorizing Provider  colchicine 0.6 MG tablet Take 2 tablets by mouth and then a third tablet by mouth one hour later 06/28/21  Yes Pollyann Savoy, MD  albuterol (VENTOLIN HFA) 108 [phone] Base) MCG/ACT inhaler  06/09/19   [provider]  amLODipine (NORVASC) 10 MG tablet Take 1 tablet (10 mg total) by mouth daily. 08/17/19 09/16/19  Azucena Fallen, MD  hydrOXYzine (VISTARIL) 50 MG capsule Take 50 mg by mouth 2 (two) times daily as needed. 05/23/19   [provider]  ibuprofen (ADVIL) 600 MG tablet Take 1 tablet (600 mg total) by mouth 3 (three) times daily. 06/28/21   Pollyann Savoy, MD  valACYclovir (VALTREX) 1000 MG tablet Take 1,000 mg by mouth 2 (two) times daily. 06/09/19   [provider]  vortioxetine HBr (TRINTELLIX) 10 MG TABS tablet Take 10 mg by mouth daily.    [provider]     Allergies    Patient has no known allergies.   Review of Systems   Review of Systems Please see HPI for pertinent positives and negatives  Physical Exam BP (!) 154/113 (BP Location: Right Arm)   Pulse 91   Temp 98.6 F (37 C) (Oral)   Resp 18   Ht 6\' 1"  (1.854 m)   Wt 117.9 kg   SpO2 97%   BMI 34.30 kg/m   Physical Exam Vitals and nursing note reviewed.  Constitutional:      Appearance: Normal appearance.  HENT:     Head: Normocephalic and atraumatic.     Nose: Nose normal.     Mouth/Throat:     Mouth: Mucous membranes are moist.  Eyes:     Extraocular  Movements: Extraocular movements intact.     Conjunctiva/sclera: Conjunctivae normal.  Cardiovascular:     Rate and Rhythm: Normal rate.  Pulmonary:     Effort: Pulmonary effort is normal.     Breath sounds: Normal breath sounds.  Abdominal:     General: Abdomen is flat.     Palpations: Abdomen is soft.     Tenderness: There is no abdominal tenderness.  Musculoskeletal:        General: Tenderness (L posterior knee and L dorsal foot, no erythema, warmth or induration) present. No swelling or deformity. Normal range of motion.     Cervical back: Neck supple.  Skin:    General: Skin is warm and dry.  Neurological:     General: No focal deficit present.     Mental Status: He is alert.  Psychiatric:        Mood and Affect: Mood normal.     ED Results / Procedures / Treatments   EKG None  Procedures Procedures  Medications Ordered in the ED Medications  ibuprofen (ADVIL) tablet 600 mg (has no administration in time range)    Initial Impression and Plan  Patient here with atraumatic L knee and foot pain similar to previously diagnosed gout. No external  signs of infection, effusion or significant inflammation. Will give Rx for colchicine, NSAID and PCP follow up.   ED Course       MDM Rules/Calculators/A&P Medical Decision Making Problems Addressed: Acute gout of left foot, unspecified cause: acute illness or injury Acute gout of left knee, unspecified cause: acute illness or injury  Risk Prescription drug management.    Final Clinical Impression(s) / ED Diagnoses Final diagnoses:  Acute gout of left knee, unspecified cause  Acute gout of left foot, unspecified cause    Rx / DC Orders ED Discharge Orders          Ordered    ibuprofen (ADVIL) 600 MG tablet  3 times daily        06/28/21 2311    colchicine 0.6 MG tablet        06/28/21 2311             Pollyann Savoy, MD 06/28/21 2312

## 2021-06-29 ENCOUNTER — Other Ambulatory Visit (HOSPITAL_BASED_OUTPATIENT_CLINIC_OR_DEPARTMENT_OTHER): Payer: Self-pay

## 2021-07-12 ENCOUNTER — Encounter (HOSPITAL_COMMUNITY): Payer: Self-pay | Admitting: Psychiatry

## 2021-07-12 ENCOUNTER — Ambulatory Visit (INDEPENDENT_AMBULATORY_CARE_PROVIDER_SITE_OTHER): Payer: 59 | Admitting: Psychiatry

## 2021-07-12 VITALS — BP 140/90 | Temp 98.6°F | Ht 73.0 in | Wt 261.0 lb

## 2021-07-12 DIAGNOSIS — F3181 Bipolar II disorder: Secondary | ICD-10-CM

## 2021-07-12 DIAGNOSIS — F4310 Post-traumatic stress disorder, unspecified: Secondary | ICD-10-CM | POA: Diagnosis not present

## 2021-07-12 DIAGNOSIS — F411 Generalized anxiety disorder: Secondary | ICD-10-CM | POA: Diagnosis not present

## 2021-07-12 MED ORDER — LAMOTRIGINE 25 MG PO TABS: 25.0000 mg | ORAL_TABLET | Freq: Every day | ORAL | 0 refills | Status: DC

## 2021-07-12 MED ORDER — VORTIOXETINE HBR 10 MG PO TABS: 10.0000 mg | ORAL_TABLET | Freq: Every day | ORAL | 0 refills | Status: DC

## 2021-07-13 ENCOUNTER — Telehealth (HOSPITAL_COMMUNITY): Payer: Self-pay | Admitting: Psychiatry

## 2021-07-13 NOTE — Telephone Encounter (Signed)
Per pharmacy and pt Needs prior auth on trintellix

## 2021-07-13 NOTE — Telephone Encounter (Signed)
Request sent. Waiting on reply from insurance

## 2021-07-13 NOTE — Telephone Encounter (Signed)
Medication is approved until 07/14/22 Pharmacy is informed by fax

## 2021-07-16 ENCOUNTER — Encounter (HOSPITAL_BASED_OUTPATIENT_CLINIC_OR_DEPARTMENT_OTHER): Payer: Self-pay

## 2021-07-16 ENCOUNTER — Emergency Department (HOSPITAL_BASED_OUTPATIENT_CLINIC_OR_DEPARTMENT_OTHER)
Admission: EM | Admit: 2021-07-16 | Discharge: 2021-07-16 | Disposition: A | Discharge status: Home / Self Care | Payer: 59 | Attending: Emergency Medicine | Admitting: Emergency Medicine

## 2021-07-16 ENCOUNTER — Other Ambulatory Visit: Payer: Self-pay

## 2021-07-16 DIAGNOSIS — Z79899 Other long term (current) drug therapy: Secondary | ICD-10-CM | POA: Diagnosis not present

## 2021-07-16 DIAGNOSIS — W57XXXA Bitten or stung by nonvenomous insect and other nonvenomous arthropods, initial encounter: Secondary | ICD-10-CM | POA: Insufficient documentation

## 2021-07-16 DIAGNOSIS — S30860A Insect bite (nonvenomous) of lower back and pelvis, initial encounter: Secondary | ICD-10-CM | POA: Insufficient documentation

## 2021-07-16 DIAGNOSIS — I10 Essential (primary) hypertension: Secondary | ICD-10-CM | POA: Insufficient documentation

## 2021-07-16 DIAGNOSIS — G8911 Acute pain due to trauma: Secondary | ICD-10-CM | POA: Diagnosis not present

## 2021-07-16 DIAGNOSIS — M545 Low back pain, unspecified: Secondary | ICD-10-CM | POA: Insufficient documentation

## 2021-07-16 MED ORDER — ACETAMINOPHEN 500 MG PO TABS
1000.0000 mg | ORAL_TABLET | Freq: Once | ORAL | Status: AC
  Administered 2021-07-16: 1000 mg via ORAL
  Filled 2021-07-16: qty 2

## 2021-07-16 MED ORDER — IBUPROFEN 400 MG PO TABS
400.0000 mg | ORAL_TABLET | Freq: Once | ORAL | Status: AC
  Administered 2021-07-16: 400 mg via ORAL
  Filled 2021-07-16: qty 1

## 2021-07-16 NOTE — Discharge Instructions (Addendum)
It was our pleasure to provide your ER care today - we hope that you feel better.  Complete the course of your antibiotic.  Take acetaminophen or ibuprofen as need.   Return to ER if worse, new symptoms, fevers, severe pain, persistent vomiting, increased swelling/spreading redness, or other concern.

## 2021-07-16 NOTE — ED Provider Notes (Signed)
[facility] EM[facility]PARTMENT Provider Note   CSN: [id number] Arrival date & time: 07/16/21  1017     History  Chief Complaint  Patient presents with   Insect Bite    Randy Rose is a 56 y.o. male.  Pt c/o area of pain to right lower back. Mild pain, dull, non radicular, right lumbar area, no midline/spine area pain. No numbness/weakness or radicular pain. States he feels he had insect bite to area a couple days ago - states did not feel a bite or sting per say, but did see a spider. States yesterday had teledoc visit and was given rx keflex. States had episode of nausea/vomiting last night - not bloody or bilious. No spreading redness or increased swelling. No fever or chills. No abd pain. No dysuria, hematuria or gu c/o.   The history is provided by the patient and medical records.       Home Medications Prior to Admission medications   Medication Sig Start Date End Date Taking? Authorizing Provider  albuterol (VENTOLIN HFA) 108 (90 Base) MCG/ACT inhaler  06/09/19   [provider]  amLODipine (NORVASC) 10 MG tablet Take 1 tablet (10 mg total) by mouth daily. 08/17/19 09/16/19  Azucena Fallen, MD  amLODipine (NORVASC) 10 MG tablet 1 tab(s) orally once a day for 90 days 09/29/20   [provider]  colchicine 0.6 MG tablet Take 2 tablets by mouth and then a third tablet by mouth one hour later 06/28/21   Pollyann Savoy, MD  colchicine 0.6 MG tablet 1 tab(s) orally once a day    [provider]  diclofenac (VOLTAREN) 75 MG EC tablet 1 tab(s) orally 2 times a day for 30 days 02/15/17   [provider]  gabapentin (NEURONTIN) 100 MG capsule 2 cap(s) orally 3 times a day for 30 day(s) 07/05/21   [provider]  hydrOXYzine (VISTARIL) 50 MG capsule Take 50 mg by mouth 2 (two) times daily as needed. Patient not taking: Reported on 07/12/2021 05/23/19   [provider]  ibuprofen (ADVIL) 600 MG tablet Take 1 tablet (600  mg total) by mouth 3 (three) times daily. Patient not taking: Reported on 07/12/2021 06/28/21   Pollyann Savoy, MD  lamoTRIgine (LAMICTAL) 25 MG tablet Take 1 tablet (25 mg total) by mouth daily. Take one tablet daily for a week and then start taking 2 tablets. 07/12/21   Thresa Ross, MD  lisinopril-hydrochlorothiazide (ZESTORETIC) 20-12.5 MG tablet Take 2 tablets by mouth daily. 05/17/20   [provider]  valACYclovir (VALTREX) 1000 MG tablet Take 1,000 mg by mouth 2 (two) times daily. Patient not taking: Reported on 07/12/2021 06/09/19   [provider]  vortioxetine HBr (TRINTELLIX) 10 MG TABS tablet Take 1 tablet (10 mg total) by mouth daily. 07/12/21   Thresa Ross, MD      Allergies    Patient has no known allergies.    Review of Systems   Review of Systems  Constitutional:  Negative for fever.  HENT:  Negative for sore throat.   Respiratory:  Negative for cough and shortness of breath.   Cardiovascular:  Negative for chest pain.  Gastrointestinal:  Negative for abdominal pain.  Genitourinary:  Negative for dysuria.  Musculoskeletal:  Positive for back pain. Negative for neck pain and neck stiffness.  Skin:  Negative for rash and wound.  Neurological:  Negative for weakness, numbness and headaches.  Hematological:  Does not bruise/bleed easily.  Psychiatric/Behavioral:  Negative for  confusion.     Physical Exam Updated Vital Signs BP (!) 139/96   Pulse 94   Temp 98.4 F (36.9 C) (Oral)   Ht 1.854 m (6\' 1" )   Wt 118.4 kg   SpO2 97%   BMI 34.44 kg/m  Physical Exam Vitals and nursing note reviewed.  Constitutional:      Appearance: Normal appearance. He is well-developed.  HENT:     Head: Atraumatic.     Nose: Nose normal.     Mouth/Throat:     Mouth: Mucous membranes are moist.     Pharynx: Oropharynx is clear.  Eyes:     General: No scleral icterus.    Conjunctiva/sclera: Conjunctivae normal.  Neck:     Trachea: No tracheal deviation.   Cardiovascular:     Rate and Rhythm: Normal rate and regular rhythm.     Pulses: Normal pulses.     Heart sounds: Normal heart sounds. No murmur heard.    No friction rub. No gallop.  Pulmonary:     Effort: Pulmonary effort is normal. No accessory muscle usage or respiratory distress.     Breath sounds: Normal breath sounds.  Abdominal:     General: Bowel sounds are normal. There is no distension.     Palpations: Abdomen is soft.     Tenderness: There is no abdominal tenderness.  Genitourinary:    Comments: No cva tenderness. Musculoskeletal:        General: No swelling.     Cervical back: Normal range of motion and neck supple. No rigidity.     Comments: T/L/S spine non tender, aligned.  There is a small nodule right mid back < 1 cm diameter, possibly consistent with insect bite, but no pain, tenderness or erythema to this area. Patient points to right lower lumbar area as area of pain, mild muscular tenderness to area, no skin changes or sts to area.   Skin:    General: Skin is warm and dry.     Findings: No rash.  Neurological:     Mental Status: He is alert.     Comments: Alert, speech clear. Motor/sens grossly intact bil. Steady gait.   Psychiatric:        Mood and Affect: Mood normal.     ED Results / Procedures / Treatments   Labs (all labs ordered are listed, but only abnormal results are displayed) Labs Reviewed - No data to display  EKG None  Radiology No results found.  Procedures Procedures    Medications Ordered in ED Medications  ibuprofen (ADVIL) tablet 400 mg (has no administration in time range)  acetaminophen (TYLENOL) tablet 1,000 mg (has no administration in time range)    ED Course/ Medical Decision Making/ A&P                           Medical Decision Making Problems Addressed: Acute right-sided low back pain without sciatica: acute illness or injury with systemic symptoms Essential hypertension: chronic illness or injury that poses a  threat to life or bodily functions Insect bite of lower back, initial encounter: acute illness or injury  Amount and/or Complexity of Data Reviewed External Data Reviewed: notes.  Risk OTC drugs. Prescription drug management.   Reviewed nursing notes and prior charts for additional history.   No meds for pain today or pta. Acetaminophen po, ibuprofen po. Po fluids.  No nausea/vomiting.   Pt recently started keflex for possible local infection at suspected  insect bite site.   Pt appears stable for d/c.   Return precautions provided.           Final Clinical Impression(s) / ED Diagnoses Final diagnoses:  None    Rx / DC Orders ED Discharge Orders     None         Cathren Laine, MD 07/16/21 1554

## 2021-07-16 NOTE — ED Triage Notes (Signed)
Patient states Thursday he had a spider bite on his left lower back. Started he had a tele-doc appointment yesterday and started cefelexin 500 mg and muproicin cream - states is not helping and he started vomiting last night.

## 2021-07-30 ENCOUNTER — Encounter (HOSPITAL_BASED_OUTPATIENT_CLINIC_OR_DEPARTMENT_OTHER): Payer: Self-pay | Admitting: Urology

## 2021-07-30 ENCOUNTER — Other Ambulatory Visit: Payer: Self-pay

## 2021-07-30 ENCOUNTER — Emergency Department (HOSPITAL_BASED_OUTPATIENT_CLINIC_OR_DEPARTMENT_OTHER)
Admission: EM | Admit: 2021-07-30 | Discharge: 2021-07-30 | Disposition: A | Discharge status: Home / Self Care | Payer: 59 | Attending: Emergency Medicine | Admitting: Emergency Medicine

## 2021-07-30 DIAGNOSIS — Z79899 Other long term (current) drug therapy: Secondary | ICD-10-CM | POA: Diagnosis not present

## 2021-07-30 DIAGNOSIS — R21 Rash and other nonspecific skin eruption: Secondary | ICD-10-CM | POA: Insufficient documentation

## 2021-07-30 NOTE — Discharge Instructions (Addendum)
Keep the affected area clean and dry.  Do not apply Neosporin or antibiotic ointment.  Do not use hydrocortisone cream.  You may try over-the-counter antifungal treatment in the pharmacy section such as Lamisil or Lotrimin powder.  Get rechecked if symptoms change or worsen.

## 2021-07-30 NOTE — ED Triage Notes (Signed)
Pt states rash to perineum and groin Reports itching and drainage of clear liquid

## 2021-07-30 NOTE — ED Provider Notes (Signed)
[facility] EMERGENCY DEPARTMENT Provider Note   CSN: [id number] Arrival date & time: 07/30/21  0118     History  Chief Complaint  Patient presents with   Rash    Randy Rose is a 56 y.o. male.  The history is provided by the patient.  Rash Randy Rose is a 56 y.o. male who presents to the Emergency Department complaining of rash.  He presents to the emergency department complaining of 2 to 3 weeks of itchy rash to the perirectal region.  He thought that it was due to a body wash that he used.  About 2 days after the rash started he started using Neosporin ointment 3 times daily.  Rash continues to be itchy and at times he feels like he is leaking fluid.  He also has some rash to the posterior scrotal region.  No associated fevers, abdominal pain, nausea, vomiting, dysuria.    History of HSV 1.  Not sexually active.  No trauma to the area.  He does work in a very hot environment and sweats frequently.    Home Medications Prior to Admission medications   Medication Sig Start Date End Date Taking? Authorizing Provider  albuterol (VENTOLIN HFA) 108 (90 Base) MCG/ACT inhaler  06/09/19   [provider]  amLODipine (NORVASC) 10 MG tablet Take 1 tablet (10 mg total) by mouth daily. 08/17/19 09/16/19  Azucena Fallen, MD  amLODipine (NORVASC) 10 MG tablet 1 tab(s) orally once a day for 90 days 09/29/20   [provider]  colchicine 0.6 MG tablet Take 2 tablets by mouth and then a third tablet by mouth one hour later 06/28/21   Pollyann Savoy, MD  colchicine 0.6 MG tablet 1 tab(s) orally once a day    [provider]  diclofenac (VOLTAREN) 75 MG EC tablet 1 tab(s) orally 2 times a day for 30 days 02/15/17   [provider]  gabapentin (NEURONTIN) 100 MG capsule 2 cap(s) orally 3 times a day for 30 day(s) 07/05/21   [provider]  hydrOXYzine (VISTARIL) 50 MG capsule Take 50 mg by mouth 2 (two) times daily as needed. Patient  not taking: Reported on 07/12/2021 05/23/19   [provider]  ibuprofen (ADVIL) 600 MG tablet Take 1 tablet (600 mg total) by mouth 3 (three) times daily. Patient not taking: Reported on 07/12/2021 06/28/21   Pollyann Savoy, MD  lamoTRIgine (LAMICTAL) 25 MG tablet Take 1 tablet (25 mg total) by mouth daily. Take one tablet daily for a week and then start taking 2 tablets. 07/12/21   Thresa Ross, MD  lisinopril-hydrochlorothiazide (ZESTORETIC) 20-12.5 MG tablet Take 2 tablets by mouth daily. 05/17/20   [provider]  valACYclovir (VALTREX) 1000 MG tablet Take 1,000 mg by mouth 2 (two) times daily. Patient not taking: Reported on 07/12/2021 06/09/19   [provider]  vortioxetine HBr (TRINTELLIX) 10 MG TABS tablet Take 1 tablet (10 mg total) by mouth daily. 07/12/21   Thresa Ross, MD      Allergies    Patient has no known allergies.    Review of Systems   Review of Systems  Skin:  Positive for rash.  All other systems reviewed and are negative.   Physical Exam Updated Vital Signs BP (!) 157/110 (BP Location: Right Arm)   Pulse 98   Temp 98.1 F (36.7 C) (Oral)   Resp 18   Ht 6\' 1"  (1.854 m)   Wt 118.8 kg   SpO2 96%  BMI 34.57 kg/m  Physical Exam Vitals and nursing note reviewed.  Constitutional:      Appearance: He is well-developed.  HENT:     Head: Normocephalic and atraumatic.  Cardiovascular:     Rate and Rhythm: Normal rate and regular rhythm.  Pulmonary:     Effort: Pulmonary effort is normal. No respiratory distress.  Genitourinary:    Testes: Normal.     Rectum: Normal.       Comments: There are 2 areas on the right and left gluteal region with a few very faint scattered papules and skin thickening.  There are a few scattered areas of papules and skin thickening to the posterior aspect of the scrotum. Musculoskeletal:        General: No tenderness.  Skin:    General: Skin is warm and dry.  Neurological:     Mental Status: He  is alert and oriented to person, place, and time.  Psychiatric:        Behavior: Behavior normal.     ED Results / Procedures / Treatments   Labs (all labs ordered are listed, but only abnormal results are displayed) Labs Reviewed - No data to display  EKG None  Radiology No results found.  Procedures Procedures    Medications Ordered in ED Medications - No data to display  ED Course/ Medical Decision Making/ A&P                           Medical Decision Making  Patient here for evaluation of GU rash.  He does have some skin thickening and mild local irritation.  There are no ulcerations or evidence of acute bacterial infection.  Areas that are affected appear that they are related from likely friction, possible local reaction from Neosporin use and heat exposure.  Less likely to be fungal in origin but recommend that he start OTC antifungal powder.  Offered STI testing-patient declines.  Discussed supportive care at home, PCP follow-up.       Final Clinical Impression(s) / ED Diagnoses Final diagnoses:  Rash    Rx / DC Orders ED Discharge Orders     None         Tilden Fossa, MD 07/30/21 [phone]

## 2021-08-11 ENCOUNTER — Ambulatory Visit (INDEPENDENT_AMBULATORY_CARE_PROVIDER_SITE_OTHER): Payer: 59 | Admitting: Psychiatry

## 2021-08-11 ENCOUNTER — Encounter (HOSPITAL_COMMUNITY): Payer: Self-pay | Admitting: Psychiatry

## 2021-08-11 VITALS — BP 128/82 | Temp 98.6°F | Ht 73.0 in | Wt 262.0 lb

## 2021-08-11 DIAGNOSIS — F411 Generalized anxiety disorder: Secondary | ICD-10-CM | POA: Diagnosis not present

## 2021-08-11 DIAGNOSIS — F4310 Post-traumatic stress disorder, unspecified: Secondary | ICD-10-CM

## 2021-08-11 DIAGNOSIS — F3181 Bipolar II disorder: Secondary | ICD-10-CM | POA: Diagnosis not present

## 2021-08-11 MED ORDER — VORTIOXETINE HBR 10 MG PO TABS: 10.0000 mg | ORAL_TABLET | Freq: Every day | ORAL | 0 refills | Status: DC

## 2021-08-11 MED ORDER — LAMOTRIGINE 25 MG PO TABS
25.0000 mg | ORAL_TABLET | Freq: Every day | ORAL | 0 refills | Status: DC
Start: 2021-08-11 — End: 2021-09-08
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
## 2021-08-26 ENCOUNTER — Ambulatory Visit (INDEPENDENT_AMBULATORY_CARE_PROVIDER_SITE_OTHER): Payer: 59 | Admitting: Licensed Clinical Social Worker

## 2021-08-26 ENCOUNTER — Encounter (HOSPITAL_COMMUNITY): Payer: Self-pay

## 2021-08-26 DIAGNOSIS — F4310 Post-traumatic stress disorder, unspecified: Secondary | ICD-10-CM

## 2021-08-26 NOTE — Plan of Care (Signed)
  Problem: PTSD-Trauma Disorder CCP Problem  1 Past trauma Goal:  Randy Rose will manage mood and PTSD symptoms as evidenced by processing past, coping with nervous symptom responses to trauma, reduce dissociative feelings,  create trauma narrative, and increase self soothing for 5 out of 7 days for 60 days.  Outcome: Initial Goal: STG: Randy Rose WILL PARTICIPATE IN 5 SESSIONS OF PROLONGED EXPOSURE (PE) THERAPY, TO INCLUDE IN VIVO AND IMAGINED EXPOSURE WITH INDIVIDUAL THERAPIST 1 PER WEEK Outcome: Initial Goal: STG: Randy Rose WILL PRACTICE EMOTION REGULATION SKILLS 4 PER WEEK FOR THE NEXT 6 WEEKS Outcome: Initial
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
## 2021-08-31 ENCOUNTER — Telehealth (HOSPITAL_COMMUNITY): Payer: Self-pay | Admitting: Psychiatry

## 2021-08-31 NOTE — Telephone Encounter (Signed)
Patient called requesting an update on the status of FMLA paperwork submitted on 08/26/2021. States this must be turned in by 09/07/2021. Contact number: [phone].

## 2021-09-01 NOTE — Telephone Encounter (Signed)
Medication management - Telephone call with patient to inform Dr. Gilmore Laroche filled out his requested FMLA paperwork and patient will come by on 09/02/21 morning to pay cost and to have these faxed into [facility] for him.  Copy to be sent to scan for patient.

## 2021-09-01 NOTE — Telephone Encounter (Signed)
Medication management - Telephone call with pt to follow-up with him on requested FMLA forms for Dr. Gilmore Laroche to completed.  Verified patient was not wanting any accommodations when at work but to only be allowed to leave or miss work up to 4-5 days a month when patient experiences a panic attack.  Patient stated at times he is able to work all week with no problem and then at times it becomes a problem when he is at work or prior to going and then has a hard time staying and focusing due to anxiousness.  Patient reported he has had this occur as much as 3 times in a week but not weekly and requests up to 4-5 times a month to be covered if needed as patient stated his employment was asking for this to help maintain his employment.  Agreed to inform Dr. Gilmore Laroche of his requests and patient will be in the office on 09/02/21 to pick up forms if complete.  If not, patient wanted to make sure these are returned to [facility] for his employment by 09/07/21 and then patient was reminded he also sees Dr. Gilmore Laroche again on 09/08/21 and plans to keep next appointment.

## 2021-09-02 ENCOUNTER — Ambulatory Visit (INDEPENDENT_AMBULATORY_CARE_PROVIDER_SITE_OTHER): Payer: 59 | Admitting: Licensed Clinical Social Worker

## 2021-09-02 DIAGNOSIS — F4310 Post-traumatic stress disorder, unspecified: Secondary | ICD-10-CM

## 2021-09-02 DIAGNOSIS — Z0289 Encounter for other administrative examinations: Secondary | ICD-10-CM

## 2021-09-02 NOTE — Telephone Encounter (Signed)
Paid form fee and successfully faxed.

## 2021-09-03 NOTE — Progress Notes (Signed)
   THERAPIST PROGRESS NOTE  Session Time: 9:00 am-9:45 am  Type of Therapy: Individual Therapy  Session#1  Purpose of Session: Randy Rose will manage mood and PTSD symptoms as evidenced by processing past, coping with nervous symptom responses to trauma, reduce dissociative feelings,  create trauma narrative, and increase self soothing for 5 out of 7 days for 60 days.  ProgressTowards Goals: Initial  Interventions: Therapist utilized CBT and Solution Focused brief therapy to address mood, anxiety, and trauma. Therapist provided support and empathy to patient during session. Therapist explored patient's triggers for mood and anxiety. Therapist provided psychoeducation on CBT and Fight or flight response. Therapist worked with patient to identify exceptions to patient's anxiety and how he manages his anxiety.   Effectiveness: Patient was oriented x4 (person, place, situation, and time). Patient was casually dressed, and appropriately groomed. Patient was alert, engaged, pleasant, and cooperative. Patient noted that he has had some good and bad days. For him a good day is waking up, having his coffee, wanting to go to work, doing antiquing or yard work, and enjoying his home. Patient has a lot of Asian decorations, furniture, etc which he finds calming and relaxing. Patient shared an example of not having paperwork completed and getting an email from his job about it. His thoughts spiraled to losing his job, not being able to pay his bills, and ending up homeless. Patient was able to resolve the situation but had to calm down due to his stress response being so high. Patient often smells smoke with which triggers anxiety and/or thoughts of trauma. Patient understood CBT and Fight or Flight response. Patient agreed to manage the physical symptoms of anxiety through breathing or muscle relaxation. Patient agreed to track his thoughts/mood.   Patient engaged in session. He responded well to interventions.  Patient continues to meet criteria for PTSD. Patient will continue in outpatient therapy due to being the least restrictive service to meet his needs. Patient made minimal progress on his goals.   Suicidal/Homicidal: Nowithout intent/plan  Plan: Return again in 2-4 weeks.  Diagnosis: PTSD (post-traumatic stress disorder)  Collaboration of Care: Psychiatrist AEB Dr. Kalman Jewels  Patient/Guardian was advised Release of Information must be obtained prior to any record release in order to collaborate their care with an outside provider. Patient/Guardian was advised if they have not already done so to contact the registration department to sign all necessary forms in order for [place] to release information regarding their care.   Consent: Patient/Guardian gives verbal consent for treatment and assignment of benefits for services provided during this visit. Patient/Guardian expressed understanding and agreed to proceed.   Bynum Bellows, LCSW 09/03/2021

## 2021-09-08 ENCOUNTER — Ambulatory Visit (INDEPENDENT_AMBULATORY_CARE_PROVIDER_SITE_OTHER): Payer: 59 | Admitting: Psychiatry

## 2021-09-08 ENCOUNTER — Encounter (HOSPITAL_COMMUNITY): Payer: Self-pay | Admitting: Psychiatry

## 2021-09-08 VITALS — BP 148/100 | Temp 97.7°F | Ht 73.0 in | Wt 260.0 lb

## 2021-09-08 DIAGNOSIS — F3181 Bipolar II disorder: Secondary | ICD-10-CM

## 2021-09-08 DIAGNOSIS — F411 Generalized anxiety disorder: Secondary | ICD-10-CM | POA: Diagnosis not present

## 2021-09-08 DIAGNOSIS — F4310 Post-traumatic stress disorder, unspecified: Secondary | ICD-10-CM

## 2021-09-08 MED ORDER — LAMOTRIGINE 25 MG PO TABS: 75.0000 mg | ORAL_TABLET | Freq: Every day | ORAL | 0 refills | Status: DC
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
## 2021-09-14 ENCOUNTER — Ambulatory Visit (HOSPITAL_COMMUNITY): Payer: 59 | Admitting: Licensed Clinical Social Worker

## 2021-10-12 ENCOUNTER — Ambulatory Visit (INDEPENDENT_AMBULATORY_CARE_PROVIDER_SITE_OTHER): Payer: Commercial Managed Care - HMO | Admitting: Licensed Clinical Social Worker

## 2021-10-12 DIAGNOSIS — F4310 Post-traumatic stress disorder, unspecified: Secondary | ICD-10-CM | POA: Diagnosis not present
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
## 2021-10-18 ENCOUNTER — Ambulatory Visit (INDEPENDENT_AMBULATORY_CARE_PROVIDER_SITE_OTHER): Payer: Commercial Managed Care - HMO | Admitting: Licensed Clinical Social Worker

## 2021-10-18 DIAGNOSIS — F4310 Post-traumatic stress disorder, unspecified: Secondary | ICD-10-CM | POA: Diagnosis not present
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
## 2021-10-20 ENCOUNTER — Encounter (HOSPITAL_COMMUNITY): Payer: Self-pay | Admitting: Psychiatry

## 2021-10-20 ENCOUNTER — Ambulatory Visit (INDEPENDENT_AMBULATORY_CARE_PROVIDER_SITE_OTHER): Payer: Commercial Managed Care - HMO | Admitting: Psychiatry

## 2021-10-20 ENCOUNTER — Other Ambulatory Visit (HOSPITAL_COMMUNITY): Payer: Self-pay

## 2021-10-20 VITALS — BP 140/100 | Temp 98.4°F | Ht 73.0 in | Wt 268.0 lb

## 2021-10-20 DIAGNOSIS — F4310 Post-traumatic stress disorder, unspecified: Secondary | ICD-10-CM

## 2021-10-20 DIAGNOSIS — F411 Generalized anxiety disorder: Secondary | ICD-10-CM

## 2021-10-20 DIAGNOSIS — F3181 Bipolar II disorder: Secondary | ICD-10-CM | POA: Diagnosis not present

## 2021-10-20 MED ORDER — VORTIOXETINE HBR 10 MG PO TABS: 10.0000 mg | ORAL_TABLET | Freq: Every day | ORAL | 1 refills | Status: DC

## 2021-10-20 MED ORDER — ARIPIPRAZOLE 5 MG PO TABS: 5.0000 mg | ORAL_TABLET | Freq: Every day | ORAL | 0 refills | Status: DC

## 2021-10-20 MED ORDER — LAMOTRIGINE 100 MG PO TABS
100.0000 mg | ORAL_TABLET | Freq: Every day | ORAL | 1 refills | Status: DC
Start: 2021-10-20 — End: 2021-10-20

## 2021-10-20 MED ORDER — LAMOTRIGINE 100 MG PO TABS: 100.0000 mg | ORAL_TABLET | Freq: Every day | ORAL | 1 refills | Status: DC
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
## 2021-10-25 ENCOUNTER — Telehealth (HOSPITAL_COMMUNITY): Payer: Self-pay

## 2021-10-25 NOTE — Telephone Encounter (Signed)
Prior Approval done for Trintellix 10mg  Approved as of 10/09 Pharmacy notified by fax

## 2021-10-26 ENCOUNTER — Telehealth (HOSPITAL_COMMUNITY): Payer: Self-pay

## 2021-10-26 MED ORDER — VENLAFAXINE HCL 37.5 MG PO TABS: 37.5000 mg | ORAL_TABLET | Freq: Two times a day (BID) | ORAL | 0 refills | Status: DC

## 2021-10-26 NOTE — Telephone Encounter (Signed)
I left vm informing patient of what Dr. De Nurse stated in previous message. Call back with questions or concerns

## 2021-10-26 NOTE — Telephone Encounter (Signed)
I scanned the fax to your email

## 2021-10-26 NOTE — Telephone Encounter (Signed)
Did a prior authorization for Trintellix and pharmacy says it still will cost the patient $464 per month. They are asking for a change in medication. Please advise

## 2021-10-31 ENCOUNTER — Ambulatory Visit (HOSPITAL_COMMUNITY): Payer: 59 | Admitting: Licensed Clinical Social Worker

## 2021-11-13 ENCOUNTER — Other Ambulatory Visit (HOSPITAL_COMMUNITY): Payer: Self-pay | Admitting: Psychiatry

## 2021-11-17 ENCOUNTER — Ambulatory Visit (HOSPITAL_COMMUNITY): Payer: Commercial Managed Care - HMO | Admitting: Licensed Clinical Social Worker

## 2021-11-17 DIAGNOSIS — F4310 Post-traumatic stress disorder, unspecified: Secondary | ICD-10-CM | POA: Diagnosis not present

## 2021-11-17 NOTE — Progress Notes (Signed)
THERAPIST PROGRESS NOTE  Session Time: 8:00 am-8:45 am  Type of Therapy: Individual Therapy  Session#4  Purpose of Session: Randy Rose will manage mood and PTSD symptoms as evidenced by processing past, coping with nervous symptom responses to trauma, reduce dissociative feelings,  create trauma narrative, and increase self soothing for 5 out of 7 days for 60 days.  ProgressTowards Goals: Progressing  Interventions: Therapist utilized CBT, Solution focused brief therapy, and ACT to address anxiety and depression. Therapist provided support and empathy to patient during session. Therapist worked with patient to complete a life map including: 1) what or who is most important to you, 2) What thoughts, feelings, or sensations get in the way of moving forward, 3) what do you do to move away from those difficult inner experiences, and 4) what could you do to move toward who's important to you?   Effectiveness: Patient was oriented x4 (person, place, situation, and time). Patient was casually dressed, and appropriately groomed. Patient was alert, engaged, pleasant but stressed, and cooperative. Patient noted that the "buzz" of the holiday's are too much for him. He tries to avoid it due to feeling overwhelmed, and paranoid. Patient noted what was most important to him was: home financial independence, safety, peace, and a sense of control (healthy control). Patient identified what stops him from experiencing what is most important to him including: difficulty determining if things or people are real, depersonalization, paranoia, past trauma, not liking change, distrust, unhealthy desire for control, possible agoraphobia, and worse case scenario thinking. Patient noted that to move away from those inner experiences he will avoid or not sleep to have control. Patient noted that each time he leaves the home, goes to work, etc he has made the decision to "live" and engage in life.   Patient engaged in session. He  responded well to interventions. Patient continues to meet criteria for PTSD. Patient will continue in outpatient therapy due to being the least restrictive service to meet his needs. Patient made minimal progress on his goals.   Suicidal/Homicidal: Nowithout intent/plan  Plan: Return again in 2-4weeks.  Diagnosis: PTSD (post-traumatic stress disorder)  Collaboration of Care: Psychiatrist AEB Dr. Valarie Cones  Patient/Guardian was advised Release of Information must be obtained prior to any record release in order to collaborate their care with an outside provider. Patient/Guardian was advised if they have not already done so to contact the registration department to sign all necessary forms in order for [place] to release information regarding their care.   Consent: Patient/Guardian gives verbal consent for treatment and assignment of benefits for services provided during this visit. Patient/Guardian expressed understanding and agreed to proceed.   Glori Bickers, LCSW 11/17/2021

## 2021-11-18 ENCOUNTER — Other Ambulatory Visit (HOSPITAL_COMMUNITY): Payer: Self-pay | Admitting: Emergency Medicine

## 2021-11-21 ENCOUNTER — Other Ambulatory Visit (HOSPITAL_COMMUNITY): Payer: Self-pay | Admitting: Psychiatry

## 2021-11-22 ENCOUNTER — Ambulatory Visit (HOSPITAL_COMMUNITY): Payer: Commercial Managed Care - HMO | Admitting: Psychiatry

## 2021-11-22 ENCOUNTER — Ambulatory Visit (HOSPITAL_COMMUNITY): Payer: Commercial Managed Care - HMO | Admitting: Licensed Clinical Social Worker

## 2021-11-22 DIAGNOSIS — F4310 Post-traumatic stress disorder, unspecified: Secondary | ICD-10-CM
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
## 2021-11-29 ENCOUNTER — Ambulatory Visit (HOSPITAL_COMMUNITY): Payer: Commercial Managed Care - HMO | Admitting: Licensed Clinical Social Worker

## 2021-11-30 ENCOUNTER — Other Ambulatory Visit (HOSPITAL_COMMUNITY): Payer: Self-pay

## 2021-12-21 ENCOUNTER — Encounter (HOSPITAL_COMMUNITY): Payer: Self-pay

## 2021-12-21 ENCOUNTER — Ambulatory Visit (HOSPITAL_COMMUNITY): Payer: Commercial Managed Care - HMO | Admitting: Licensed Clinical Social Worker

## 2021-12-22 ENCOUNTER — Ambulatory Visit (HOSPITAL_COMMUNITY): Payer: Commercial Managed Care - HMO | Admitting: Psychiatry

## 2021-12-28 ENCOUNTER — Encounter (HOSPITAL_COMMUNITY): Payer: Self-pay

## 2021-12-28 ENCOUNTER — Ambulatory Visit (HOSPITAL_COMMUNITY): Payer: Commercial Managed Care - HMO | Admitting: Licensed Clinical Social Worker

## 2022-01-03 ENCOUNTER — Ambulatory Visit (HOSPITAL_COMMUNITY): Payer: Commercial Managed Care - HMO | Admitting: Licensed Clinical Social Worker

## 2022-01-12 ENCOUNTER — Telehealth (HOSPITAL_COMMUNITY): Payer: Self-pay | Admitting: Psychiatry

## 2022-01-12 NOTE — Telephone Encounter (Signed)
Patient left voicemail 01/12/2022 requesting a letter from provider in support of his keeping an emotional support dog he received for Christmas in his residence.

## 2022-01-17 ENCOUNTER — Encounter (HOSPITAL_COMMUNITY): Payer: Self-pay | Admitting: Psychiatry

## 2022-01-23 ENCOUNTER — Ambulatory Visit (HOSPITAL_COMMUNITY): Payer: Commercial Managed Care - HMO | Admitting: Psychiatry

## 2022-02-08 ENCOUNTER — Encounter (HOSPITAL_BASED_OUTPATIENT_CLINIC_OR_DEPARTMENT_OTHER): Payer: Self-pay | Admitting: Emergency Medicine

## 2022-02-08 ENCOUNTER — Other Ambulatory Visit: Payer: Self-pay

## 2022-02-08 ENCOUNTER — Emergency Department (HOSPITAL_BASED_OUTPATIENT_CLINIC_OR_DEPARTMENT_OTHER)
Admission: EM | Admit: 2022-02-08 | Discharge: 2022-02-08 | Disposition: A | Discharge status: Home / Self Care | Payer: PRIVATE HEALTH INSURANCE | Attending: Emergency Medicine | Admitting: Emergency Medicine

## 2022-02-08 DIAGNOSIS — R0981 Nasal congestion: Secondary | ICD-10-CM | POA: Diagnosis not present

## 2022-02-08 DIAGNOSIS — Z1152 Encounter for screening for COVID-19: Secondary | ICD-10-CM | POA: Insufficient documentation

## 2022-02-08 DIAGNOSIS — I10 Essential (primary) hypertension: Secondary | ICD-10-CM | POA: Diagnosis not present

## 2022-02-08 DIAGNOSIS — R059 Cough, unspecified: Secondary | ICD-10-CM | POA: Diagnosis not present

## 2022-02-08 DIAGNOSIS — R519 Headache, unspecified: Secondary | ICD-10-CM | POA: Diagnosis not present

## 2022-02-08 DIAGNOSIS — Z79899 Other long term (current) drug therapy: Secondary | ICD-10-CM | POA: Diagnosis not present

## 2022-02-08 LAB — RESP PANEL BY RT-PCR (RSV, FLU A&B, COVID)  RVPGX2
Influenza A by PCR: NEGATIVE
Influenza B by PCR: NEGATIVE
Resp Syncytial Virus by PCR: NEGATIVE
SARS Coronavirus 2 by RT PCR: NEGATIVE

## 2022-02-08 MED ORDER — IBUPROFEN 400 MG PO TABS
600.0000 mg | ORAL_TABLET | Freq: Once | ORAL | Status: AC
  Administered 2022-02-08: 600 mg via ORAL
  Filled 2022-02-08: qty 1

## 2022-02-08 NOTE — ED Provider Notes (Signed)
[facility] Provider Note   CSN: [id number] Arrival date & time: 02/08/22  1751     History  Chief Complaint  Patient presents with   Headache   HPI Temitayo Kimple is a 57 y.o. male with depression, hypertension and hyperlipidemia presenting for headache, cough and congestion.  Symptoms started yesterday at work.  Started with a mild "sinus headache".  Then later productive cough with green-yellow sputum.  Also endorsing nasal congestion.  Endorsing chills and a subjective fever.  Denies shortness of breath or chest pain.  Took 400 mg of Advil last night but did not help much.  Denies sore throat.  Patient compliant with blood pressure medications.   Headache      Home Medications Prior to Admission medications   Medication Sig Start Date End Date Taking? Authorizing Provider  albuterol (VENTOLIN HFA) 108 (90 Base) MCG/ACT inhaler  06/09/19   [provider]  amLODipine (NORVASC) 10 MG tablet Take 1 tablet (10 mg total) by mouth daily. 08/17/19 09/16/19  Little Ishikawa, MD  amLODipine (NORVASC) 10 MG tablet 1 tab(s) orally once a day for 90 days 09/29/20   [provider]  ARIPiprazole (ABILIFY) 5 MG tablet Take 1 tablet by mouth once daily 11/13/21   Merian Capron, MD  colchicine 0.6 MG tablet Take 2 tablets by mouth and then a third tablet by mouth one hour later 06/28/21   Truddie Hidden, MD  colchicine 0.6 MG tablet 1 tab(s) orally once a day    [provider]  diclofenac (VOLTAREN) 75 MG EC tablet 1 tab(s) orally 2 times a day for 30 days 02/15/17   [provider]  gabapentin (NEURONTIN) 100 MG capsule 2 cap(s) orally 3 times a day for 30 day(s) 07/05/21   [provider]  hydrOXYzine (VISTARIL) 50 MG capsule Take 50 mg by mouth 2 (two) times daily as needed. Patient not taking: Reported on 07/12/2021 05/23/19   [provider]  ibuprofen (ADVIL) 600 MG tablet Take 1 tablet  (600 mg total) by mouth 3 (three) times daily. Patient not taking: Reported on 07/12/2021 06/28/21   Truddie Hidden, MD  lamoTRIgine (LAMICTAL) 100 MG tablet Take 1 tablet (100 mg total) by mouth daily. 10/20/21   Merian Capron, MD  lisinopril-hydrochlorothiazide (ZESTORETIC) 20-12.5 MG tablet Take 2 tablets by mouth daily. 05/17/20   [provider]  valACYclovir (VALTREX) 1000 MG tablet Take 1,000 mg by mouth 2 (two) times daily. Patient not taking: Reported on 07/12/2021 06/09/19   [provider]  venlafaxine (EFFEXOR) 37.5 MG tablet TAKE ONE TABLET BY MOUTH TWICE DAILY. START WITH ONE TABLET DAILY FOR 5 DAYS, THEN INCREASE TO TWICE DAILY 11/21/21   Merian Capron, MD  vortioxetine HBr (TRINTELLIX) 10 MG TABS tablet Take 1 tablet (10 mg total) by mouth daily. 10/20/21   Merian Capron, MD      Allergies    Patient has no known allergies.    Review of Systems   Review of Systems  Neurological:  Positive for headaches.    Physical Exam   Vitals:   02/08/22 0847  BP: (!) 165/100  Pulse: 89  Resp: 18  Temp: 98.4 F (36.9 C)  SpO2: 98%    CONSTITUTIONAL:  well-appearing, NAD NEURO:  GCS 15. Speech is goal oriented. No deficits appreciated to CN III-XII; symmetric eyebrow raise, no facial drooping, tongue midline. Patient has equal grip strength bilaterally with 5/5 strength against resistance in all  major muscle groups bilaterally. Sensation to light touch intact. Patient moves extremities without ataxia. Normal finger-nose-finger. Patient ambulatory with steady gait. EYES:  eyes equal and reactive ENT/NECK:  Supple, no stridor CARDIO:  regular rate and rhythm, appears well-perfused  PULM:  No respiratory distress, CTAB MSK/SPINE:  No gross deformities, no edema, moves all extremities  SKIN:  no rash, atraumatic   *Additional and/or pertinent findings included in MDM below    ED Results / Procedures / Treatments   Labs (all labs ordered are listed, but only  abnormal results are displayed) Labs Reviewed  RESP PANEL BY RT-PCR (RSV, FLU A&B, COVID)  RVPGX2    EKG None  Radiology No results found.  Procedures Procedures    Medications Ordered in ED Medications  ibuprofen (ADVIL) tablet 600 mg (has no administration in time range)    ED Course/ Medical Decision Making/ A&P                             Medical Decision Making  57 year old male who is well-appearing and hemodynamically stable presenting for headache and URI symptoms.  Physical exam was unremarkable.  Differential diagnosis includes COVID, flu, RSV, pneumonia, and hypertensive emergency.  Respiratory panel was negative.  Doubt hypertensive emergency given systolic is less than 062 and patient compliant with blood pressure medications, no chest pain or shortness of breath.  Also doubt pneumonia given vitals are normal and lungs sound clear to auscultation bilaterally. Treated headache with ibuprofen.  Symptoms are consistent with a likely viral URI.  Advised conservative treatment at home.  Discussed return precautions.         Final Clinical Impression(s) / ED Diagnoses Final diagnoses:  Nonintractable headache, unspecified chronicity pattern, unspecified headache type  Cough, unspecified type  Nasal congestion    Rx / DC Orders ED Discharge Orders     None         Harriet Pho, PA-C 02/08/22 1029    Horton, Alvin Critchley, DO 02/08/22 1125

## 2022-02-08 NOTE — Discharge Instructions (Signed)
Evaluation for your symptoms revealed that you likely have an ongoing upper respiratory viral infection.  Recommend conservative treatment at home which includes rest and hydration.  You can also take Tylenol and ibuprofen for fever control and symptomatic relief.  Recommend over-the-counter cough suppressant if need be.

## 2022-02-08 NOTE — ED Triage Notes (Signed)
H/a / body aches  cough chills since yesterday

## 2022-02-08 NOTE — ED Notes (Signed)
Discharge instructions reviewed with patient. Patient verbalizes understanding, no further questions at this time. Medications/prescriptions and follow up information provided. No acute distress noted at time of departure.  

## 2022-02-14 ENCOUNTER — Ambulatory Visit (HOSPITAL_COMMUNITY): Payer: Commercial Managed Care - HMO | Admitting: Psychiatry

## 2022-03-02 ENCOUNTER — Ambulatory Visit (INDEPENDENT_AMBULATORY_CARE_PROVIDER_SITE_OTHER): Payer: PRIVATE HEALTH INSURANCE | Admitting: Licensed Clinical Social Worker

## 2022-03-02 DIAGNOSIS — F4310 Post-traumatic stress disorder, unspecified: Secondary | ICD-10-CM | POA: Diagnosis not present

## 2022-03-02 DIAGNOSIS — F3181 Bipolar II disorder: Secondary | ICD-10-CM | POA: Diagnosis not present

## 2022-03-02 DIAGNOSIS — F411 Generalized anxiety disorder: Secondary | ICD-10-CM | POA: Diagnosis not present

## 2022-03-04 NOTE — Progress Notes (Signed)
THERAPIST PROGRESS NOTE  Session Time: 4:00 pm-4:45 pm  Type of Therapy: Individual Therapy  Session#6  Purpose of Session: Maven will manage mood and PTSD symptoms as evidenced by processing past, coping with nervous symptom responses to trauma, reduce dissociative feelings,  create trauma narrative, and increase self soothing for 5 out of 7 days for 60 days.  ProgressTowards Goals: Progressing  Interventions: Therapist utilized CBT, Solution focused brief therapy, and ACT to address anxiety and depression. Therapist provided support and empathy to patient during session. Therapist administered the PHQ9 and GAD7. Therapist explored patient's anxiety and paranoia as well as any improvements. Therapist worked with patient to identify ways to determine if what he hears at night is real or hallucination/paranoia.   Effectiveness: Patient was oriented x4 (person, place, situation, and time). Patient was casually dressed, and appropriately groomed. Patient was alert, engaged, down, and cooperative. Patient completed a PHQ9 with a score of 18 indicating severe depressive symptoms present. Patient completed a gad7 with a score of 19 indicating severe anxiety symptoms. Patient noted that he has had a few outbursts at work where he thought he heard co-workers say something that they didn't say. Patient also got a dog and has been taking him out several times a day. Patient feels less paranoid going outside due his dog. Patient still reports paranoid thoughts and hallucinations. He feels like people from prison are still out to get him and views his medicine as poison. He has not taken his medicine since Nov. Patient has had less outbursts on medication than not on medication. Patient understood that his dog can help him determine if what he hears at night is real or not. He admitted that his dog doesn't bark or respond to what he thinks he hears such as someone coming through the door or the washing machine  overflowing.   Patient engaged in session. He responded well to interventions. Patient continues to meet criteria for PTSD. Patient will continue in outpatient therapy due to being the least restrictive service to meet his needs. Patient made minimal progress on his goals.   Suicidal/Homicidal: Nowithout intent/plan  Plan: Return again in 2-4 weeks. Patient will consider taking his medication.     Diagnosis: PTSD (post-traumatic stress disorder)  Bipolar 2 disorder (HCC)  GAD (generalized anxiety disorder)  Collaboration of Care: Psychiatrist AEB Dr. Valarie Cones  Patient/Guardian was advised Release of Information must be obtained prior to any record release in order to collaborate their care with an outside provider. Patient/Guardian was advised if they have not already done so to contact the registration department to sign all necessary forms in order for [place] to release information regarding their care.   Consent: Patient/Guardian gives verbal consent for treatment and assignment of benefits for services provided during this visit. Patient/Guardian expressed understanding and agreed to proceed.   Glori Bickers, LCSW 03/04/2022

## 2022-03-15 ENCOUNTER — Other Ambulatory Visit: Payer: Self-pay

## 2022-03-15 ENCOUNTER — Encounter (HOSPITAL_BASED_OUTPATIENT_CLINIC_OR_DEPARTMENT_OTHER): Payer: Self-pay

## 2022-03-15 ENCOUNTER — Emergency Department (HOSPITAL_BASED_OUTPATIENT_CLINIC_OR_DEPARTMENT_OTHER)
Admission: EM | Admit: 2022-03-15 | Discharge: 2022-03-15 | Disposition: A | Discharge status: Home / Self Care | Payer: PRIVATE HEALTH INSURANCE | Attending: Emergency Medicine | Admitting: Emergency Medicine

## 2022-03-15 DIAGNOSIS — M25562 Pain in left knee: Secondary | ICD-10-CM

## 2022-03-15 MED ORDER — NAPROXEN 500 MG PO TABS: 500.0000 mg | ORAL_TABLET | Freq: Two times a day (BID) | ORAL | 0 refills | Status: DC

## 2022-03-15 MED ORDER — KETOROLAC TROMETHAMINE 15 MG/ML IJ SOLN
15.0000 mg | Freq: Once | INTRAMUSCULAR | Status: AC
  Administered 2022-03-15: 15 mg via INTRAMUSCULAR
  Filled 2022-03-15: qty 1

## 2022-03-15 NOTE — ED Provider Notes (Signed)
[facility] Provider Note   CSN: [id number] Arrival date & time: 03/15/22  [id number]     History  Chief Complaint  Patient presents with   Knee Pain    [facility] is a 57 y.o. male.  57 yo M with a chief complaint of left knee pain.  This is a recurrent problem, patient has had pain in that left knee in the past.  I had seen an orthopedist and had cortisone injections.  This is been some years ago.  He tells me he developed acute pain about 4 days ago.  Worse with range of motion and bearing weight.  He feels like its unstable and gives out at times.  He denies obvious injury.  Denies swelling denies redness.   Knee Pain      Home Medications Prior to Admission medications   Medication Sig Start Date End Date Taking? Authorizing Provider  albuterol (VENTOLIN HFA) 108 (90 Base) MCG/ACT inhaler  06/09/19   [provider]  amLODipine (NORVASC) 10 MG tablet Take 1 tablet (10 mg total) by mouth daily. 08/17/19 09/16/19  Little Ishikawa, MD  amLODipine (NORVASC) 10 MG tablet 1 tab(s) orally once a day for 90 days 09/29/20   [provider]  ARIPiprazole (ABILIFY) 5 MG tablet Take 1 tablet by mouth once daily 11/13/21   Merian Capron, MD  colchicine 0.6 MG tablet Take 2 tablets by mouth and then a third tablet by mouth one hour later 06/28/21   Truddie Hidden, MD  colchicine 0.6 MG tablet 1 tab(s) orally once a day    [provider]  diclofenac (VOLTAREN) 75 MG EC tablet 1 tab(s) orally 2 times a day for 30 days 02/15/17   [provider]  gabapentin (NEURONTIN) 100 MG capsule 2 cap(s) orally 3 times a day for 30 day(s) 07/05/21   [provider]  hydrOXYzine (VISTARIL) 50 MG capsule Take 50 mg by mouth 2 (two) times daily as needed. Patient not taking: Reported on 07/12/2021 05/23/19   [provider]  ibuprofen (ADVIL) 600 MG tablet Take 1 tablet (600 mg total) by mouth 3 (three) times  daily. Patient not taking: Reported on 07/12/2021 06/28/21   Truddie Hidden, MD  lamoTRIgine (LAMICTAL) 100 MG tablet Take 1 tablet (100 mg total) by mouth daily. 10/20/21   Merian Capron, MD  lisinopril-hydrochlorothiazide (ZESTORETIC) 20-12.5 MG tablet Take 2 tablets by mouth daily. 05/17/20   [provider]  valACYclovir (VALTREX) 1000 MG tablet Take 1,000 mg by mouth 2 (two) times daily. Patient not taking: Reported on 07/12/2021 06/09/19   [provider]  venlafaxine (EFFEXOR) 37.5 MG tablet TAKE ONE TABLET BY MOUTH TWICE DAILY. START WITH ONE TABLET DAILY FOR 5 DAYS, THEN INCREASE TO TWICE DAILY 11/21/21   Merian Capron, MD  vortioxetine HBr (TRINTELLIX) 10 MG TABS tablet Take 1 tablet (10 mg total) by mouth daily. 10/20/21   Merian Capron, MD      Allergies    Patient has no known allergies.    Review of Systems   Review of Systems  Physical Exam Updated Vital Signs BP (!) 154/100 (BP Location: Left Arm)   Pulse 82   Temp 98.6 F (37 C) (Oral)   Resp 18   Ht '6\' 1"'$  (1.854 m)   Wt 115.2 kg   SpO2 96%   BMI 33.51 kg/m  Physical Exam Vitals and nursing note reviewed.  Constitutional:  Appearance: He is well-developed.  HENT:     Head: Normocephalic and atraumatic.  Eyes:     Pupils: Pupils are equal, round, and reactive to light.  Neck:     Vascular: No JVD.  Cardiovascular:     Rate and Rhythm: Normal rate and regular rhythm.     Heart sounds: No murmur heard.    No friction rub. No gallop.  Pulmonary:     Effort: No respiratory distress.     Breath sounds: No wheezing.  Abdominal:     General: There is no distension.     Tenderness: There is no abdominal tenderness. There is no guarding or rebound.  Musculoskeletal:        General: Normal range of motion.     Cervical back: Normal range of motion and neck supple.     Comments: Patient has some discomfort with range of motion of the left knee.  He has no obvious effusion.  He does have some  ligamentous laxity with lateral displacement.  Difficult to perform McMurray's due to discomfort.  Pulse motor and sensation are intact distally.  Skin:    Coloration: Skin is not pale.     Findings: No rash.  Neurological:     Mental Status: He is alert and oriented to person, place, and time.  Psychiatric:        Behavior: Behavior normal.     ED Results / Procedures / Treatments   Labs (all labs ordered are listed, but only abnormal results are displayed) Labs Reviewed - No data to display  EKG None  Radiology No results found.  Procedures Procedures    Medications Ordered in ED Medications  ketorolac (TORADOL) 15 MG/ML injection 15 mg (has no administration in time range)    ED Course/ Medical Decision Making/ A&P                             Medical Decision Making Risk Prescription drug management.   57 yo M with a chief complaint of left knee pain.  Been going on for about 4 days.  Has had trouble with that knee in the past.  Seen orthopedics previously and had cortisone injections.  My exam was some ligamentous laxity.  Seems less likely to be acute without effusion.  Placed in a knee immobilizer.  Given sports medicine and/or orthopedic follow-up.  7:25 AM:  I have discussed the diagnosis/risks/treatment options with the patient.  Evaluation and diagnostic testing in the emergency department does not suggest an emergent condition requiring admission or immediate intervention beyond what has been performed at this time.  They will follow up with PCP. We also discussed returning to the ED immediately if new or worsening sx occur. We discussed the sx which are most concerning (e.g., sudden worsening pain, fever, inability to tolerate by mouth) that necessitate immediate return. Medications administered to the patient during their visit and any new prescriptions provided to the patient are listed below.  Medications given during this visit Medications  ketorolac  (TORADOL) 15 MG/ML injection 15 mg (has no administration in time range)     The patient appears reasonably screen and/or stabilized for discharge and I doubt any other medical condition or other [facility] requiring further screening, evaluation, or treatment in the ED at this time prior to discharge.          Final Clinical Impression(s) / ED Diagnoses Final diagnoses:  Acute pain of left knee  Rx / DC Orders ED Discharge Orders     None         Deno Etienne, DO 03/15/22 0725

## 2022-03-15 NOTE — ED Triage Notes (Signed)
Left knee pain for the past 4-5 days. Denies new injury. Has an old injury from years ago. Has tried OTC patches, tylenol, advil, arthritis goody powder - no relief.   Pt's job is physically demanding. Pt limping on leg.

## 2022-03-15 NOTE — Discharge Instructions (Signed)
Try to keep your weight off of your leg as best you can.  Tylenol and ibuprofen are naproxen.  I have given you information for the sports medicine doctors in this building as well as the orthopedist that is on-call.  Take 4 over the counter ibuprofen tablets 3 times a day or 2 over-the-counter naproxen tablets twice a day for pain. Also take tylenol '1000mg'$ (2 extra strength) four times a day.

## 2022-03-16 ENCOUNTER — Ambulatory Visit (INDEPENDENT_AMBULATORY_CARE_PROVIDER_SITE_OTHER): Payer: PRIVATE HEALTH INSURANCE | Admitting: Family Medicine

## 2022-03-16 ENCOUNTER — Ambulatory Visit: Payer: Self-pay

## 2022-03-16 ENCOUNTER — Encounter: Payer: Self-pay | Admitting: Family Medicine

## 2022-03-16 VITALS — BP 160/100 | Ht 73.0 in | Wt 232.0 lb

## 2022-03-16 DIAGNOSIS — M65962 Unspecified synovitis and tenosynovitis, left lower leg: Secondary | ICD-10-CM | POA: Insufficient documentation

## 2022-03-16 DIAGNOSIS — M659 Synovitis and tenosynovitis, unspecified: Secondary | ICD-10-CM | POA: Diagnosis not present

## 2022-03-16 DIAGNOSIS — M25562 Pain in left knee: Secondary | ICD-10-CM

## 2022-03-16 MED ORDER — TRIAMCINOLONE ACETONIDE 40 MG/ML IJ SUSP
40.0000 mg | Freq: Once | INTRAMUSCULAR | Status: AC
  Administered 2022-03-16: 40 mg via INTRA_ARTICULAR

## 2022-03-16 NOTE — Assessment & Plan Note (Signed)
Acutely occurring.  Has mild effusion and irritation of the synovium.  May have a component of insufficiency fracture of the proximal medial tibial plateau -Can start on home exercise therapy and supportive care. - injection today  - provided work note  - counseled on crutches  - could consider further imaging.

## 2022-03-16 NOTE — Progress Notes (Signed)
  [facility] - 57 y.o. male MRN [id number]  Date of birth: [birth year]  SUBJECTIVE:  Including CC & ROS.  No chief complaint on file.   [facility] is a 57 y.o. male that is presenting with acute left knee pain.  The pain is occurring on the medial compartment.  He is having trouble walking.  No history of similar pain.  The pain worsened this past weekend and he has been having severe pain.   Review of Systems See HPI   HISTORY: Past Medical, Surgical, Social, and Family History Reviewed & Updated per EMR.   Pertinent Historical Findings include:  Past Medical History:  Diagnosis Date   Depression    Herpes simplex type 2 infection    Hyperlipidemia    Hypertension     History reviewed. No pertinent surgical history.   PHYSICAL EXAM:  VS: BP (!) 160/100   Ht 6' 1"$  (1.854 m)   Wt 232 lb (105.2 kg)   BMI 30.61 kg/m  Physical Exam Gen: NAD, alert, cooperative with exam, well-appearing MSK:  Neurovascularly intact    Limited ultrasound: Left knee pain:  Mild effusion the suprapatellar pouch. Normal-appearing quadricep and patellar tendon. Mild degenerative changes of the meniscus and the lateral joint space. Degenerative changes and hyperemia of the medial joint space. There is mild hyperemia at the proximal tibial plateau. Hyperemia along the medial femoral condyle more consistent with a synovitis.  Summary: Findings consistent with synovitis as well as subchondral marrow edema of the proximal tibial plateau.  Ultrasound and interpretation by Clearance Coots, MD  Aspiration/Injection Procedure Note [facility] 04-Jun-1965  Procedure: Injection Indications: left knee pain   Procedure Details Consent: Risks of procedure as well as the alternatives and risks of each were explained to the (patient/caregiver).  Consent for procedure obtained. Time Out: Verified patient identification, verified procedure, site/side was marked, verified correct patient position,  special equipment/implants available, medications/allergies/relevent history reviewed, required imaging and test results available.  Performed.  The area was cleaned with iodine and alcohol swabs.    The left knee superior lateral suprapatellar pouch was injected using 1 cc of 1% lidocaine on a 22-gauge 1-1/2 inch needle.  The syringe was switched to mixture containing 1 cc's of 40 mg Kenalog and 4 cc's of 0.25% bupivacaine was injected.  Ultrasound was used. Images were obtained in long views showing the injection.     A sterile dressing was applied.  Patient did tolerate procedure well.     ASSESSMENT & PLAN:   Synovitis of left knee Acutely occurring.  Has mild effusion and irritation of the synovium.  May have a component of insufficiency fracture of the proximal medial tibial plateau -Can start on home exercise therapy and supportive care. - injection today  - provided work note  - counseled on crutches  - could consider further imaging.

## 2022-03-16 NOTE — Patient Instructions (Signed)
Nice to meet you Please try ice  Please work on the range of motion  You can use the crutches to help with walking  Please send me a message in [facility] with any questions or updates.  Please see me back in 1-2 weeks.   --Dr. Raeford Razor

## 2022-03-21 ENCOUNTER — Ambulatory Visit (HOSPITAL_COMMUNITY): Payer: PRIVATE HEALTH INSURANCE | Admitting: Licensed Clinical Social Worker

## 2022-03-22 ENCOUNTER — Ambulatory Visit: Payer: PRIVATE HEALTH INSURANCE | Admitting: Family Medicine

## 2022-03-23 ENCOUNTER — Ambulatory Visit: Payer: PRIVATE HEALTH INSURANCE | Admitting: Family Medicine

## 2022-04-26 ENCOUNTER — Ambulatory Visit (HOSPITAL_COMMUNITY): Payer: PRIVATE HEALTH INSURANCE | Admitting: Licensed Clinical Social Worker

## 2022-05-01 ENCOUNTER — Encounter: Payer: Self-pay | Admitting: *Deleted

## 2022-05-11 ENCOUNTER — Other Ambulatory Visit: Payer: Self-pay

## 2022-05-11 ENCOUNTER — Emergency Department (HOSPITAL_BASED_OUTPATIENT_CLINIC_OR_DEPARTMENT_OTHER)
Admission: EM | Admit: 2022-05-11 | Discharge: 2022-05-11 | Discharge status: Against Medical Advice | Payer: PRIVATE HEALTH INSURANCE | Attending: Emergency Medicine | Admitting: Emergency Medicine

## 2022-05-11 DIAGNOSIS — Z5321 Procedure and treatment not carried out due to patient leaving prior to being seen by health care provider: Secondary | ICD-10-CM | POA: Insufficient documentation

## 2022-05-11 DIAGNOSIS — N5089 Other specified disorders of the male genital organs: Secondary | ICD-10-CM | POA: Insufficient documentation

## 2022-05-11 NOTE — ED Triage Notes (Signed)
Concern for staph infection underneath his right scrotum, reports theres a couple of area that has had some clear drainage for couple of weeks and its not healing

## 2022-05-21 ENCOUNTER — Emergency Department (HOSPITAL_BASED_OUTPATIENT_CLINIC_OR_DEPARTMENT_OTHER)
Admission: EM | Admit: 2022-05-21 | Discharge: 2022-05-21 | Disposition: A | Discharge status: Home / Self Care | Payer: PRIVATE HEALTH INSURANCE | Attending: Emergency Medicine | Admitting: Emergency Medicine

## 2022-05-21 ENCOUNTER — Encounter (HOSPITAL_BASED_OUTPATIENT_CLINIC_OR_DEPARTMENT_OTHER): Payer: Self-pay | Admitting: Emergency Medicine

## 2022-05-21 ENCOUNTER — Emergency Department (HOSPITAL_BASED_OUTPATIENT_CLINIC_OR_DEPARTMENT_OTHER): Payer: PRIVATE HEALTH INSURANCE

## 2022-05-21 DIAGNOSIS — M7918 Myalgia, other site: Secondary | ICD-10-CM | POA: Insufficient documentation

## 2022-05-21 DIAGNOSIS — Z79899 Other long term (current) drug therapy: Secondary | ICD-10-CM | POA: Insufficient documentation

## 2022-05-21 DIAGNOSIS — R42 Dizziness and giddiness: Secondary | ICD-10-CM | POA: Diagnosis not present

## 2022-05-21 DIAGNOSIS — Z8673 Personal history of transient ischemic attack (TIA), and cerebral infarction without residual deficits: Secondary | ICD-10-CM | POA: Diagnosis not present

## 2022-05-21 DIAGNOSIS — R52 Pain, unspecified: Secondary | ICD-10-CM

## 2022-05-21 DIAGNOSIS — I10 Essential (primary) hypertension: Secondary | ICD-10-CM | POA: Diagnosis not present

## 2022-05-21 LAB — URINALYSIS, ROUTINE W REFLEX MICROSCOPIC
Bilirubin Urine: NEGATIVE
Glucose, UA: 250 mg/dL — AB
Hgb urine dipstick: NEGATIVE
Ketones, ur: NEGATIVE mg/dL
Leukocytes,Ua: NEGATIVE
Nitrite: NEGATIVE
Protein, ur: 30 mg/dL — AB
Specific Gravity, Urine: 1.025 (ref 1.005–1.030)
pH: 5.5 (ref 5.0–8.0)

## 2022-05-21 LAB — CBC WITH DIFFERENTIAL/PLATELET
Abs Immature Granulocytes: 0.02 10*3/uL (ref 0.00–0.07)
Basophils Absolute: 0 10*3/uL (ref 0.0–0.1)
Basophils Relative: 1 %
Eosinophils Absolute: 0.1 10*3/uL (ref 0.0–0.5)
Eosinophils Relative: 2 %
HCT: 45.5 % (ref 39.0–52.0)
Hemoglobin: 14.8 g/dL (ref 13.0–17.0)
Immature Granulocytes: 0 %
Lymphocytes Relative: 40 %
Lymphs Abs: 2.4 10*3/uL (ref 0.7–4.0)
MCH: 28.2 pg (ref 26.0–34.0)
MCHC: 32.5 g/dL (ref 30.0–36.0)
MCV: 86.7 fL (ref 80.0–100.0)
Monocytes Absolute: 0.4 10*3/uL (ref 0.1–1.0)
Monocytes Relative: 6 %
Neutro Abs: 3.2 10*3/uL (ref 1.7–7.7)
Neutrophils Relative %: 51 %
Platelets: 227 10*3/uL (ref 150–400)
RBC: 5.25 MIL/uL (ref 4.22–5.81)
RDW: 13.5 % (ref 11.5–15.5)
WBC: 6.1 10*3/uL (ref 4.0–10.5)
nRBC: 0 % (ref 0.0–0.2)

## 2022-05-21 LAB — TROPONIN I (HIGH SENSITIVITY)
Troponin I (High Sensitivity): 5 ng/L (ref <18)
Troponin I (High Sensitivity): 6 ng/L (ref <18)

## 2022-05-21 LAB — COMPREHENSIVE METABOLIC PANEL
ALT: 24 U/L (ref 0–44)
AST: 25 U/L (ref 15–41)
Albumin: 4.1 g/dL (ref 3.5–5.0)
Alkaline Phosphatase: 81 U/L (ref 38–126)
Anion gap: 11 (ref 5–15)
BUN: 17 mg/dL (ref 6–20)
CO2: 22 mmol/L (ref 22–32)
Calcium: 8.8 mg/dL — ABNORMAL LOW (ref 8.9–10.3)
Chloride: 105 mmol/L (ref 98–111)
Creatinine, Ser: 0.93 mg/dL (ref 0.61–1.24)
GFR, Estimated: >60 mL/min (ref >60)
Glucose, Bld: 159 mg/dL — ABNORMAL HIGH (ref 70–99)
Potassium: 3.9 mmol/L (ref 3.5–5.1)
Sodium: 138 mmol/L (ref 135–145)
Total Bilirubin: 0.4 mg/dL (ref 0.3–1.2)
Total Protein: 7.6 g/dL (ref 6.5–8.1)

## 2022-05-21 LAB — URINALYSIS, MICROSCOPIC (REFLEX): Bacteria, UA: NONE SEEN

## 2022-05-21 MED ORDER — IOHEXOL 350 MG/ML SOLN
75.0000 mL | Freq: Once | INTRAVENOUS | Status: AC | PRN
  Administered 2022-05-21: 75 mL via INTRAVENOUS

## 2022-05-21 NOTE — ED Notes (Addendum)
Orthostatic vitals completed per order. Pt said he was dizzy when laying down in bed. When he sat up he became increasingly nauseous and dizzy. He felt the same when standing up and said that he is "wavy like falling". Pt assisted back to bed.  When doing orthostatic vitals patients oxygen levels dropped to as low as 91/92 when standing.

## 2022-05-21 NOTE — Discharge Instructions (Addendum)
Your workup today was reassuring.  Your CT scan showed no acute findings.  Please follow-up with your primary care provider for further evaluation and management as needed.  If you develop any life-threatening symptoms such as chest pain, changes in mental status, or shortness of breath please return to the emergency department.

## 2022-05-21 NOTE — ED Provider Notes (Signed)
[place] [facility] AT [facility] Provider Note   CSN: [id number] Arrival date & time: 05/21/22  7829     History  Chief Complaint  Patient presents with   Generalized Body Aches    Randy Rose is a 57 y.o. male.  Patient presents to the emergency department complaining of body aches, chills, and palpitations.  Patient states that he recently had an infected area under his scrotum on the right side which he treated at home with antibiotic cream.  He states this area has healed.  He states that for the past 2 weeks he has been having bodyaches, chills, and has felt tired.  He also complains of intermittent palpitations.  He denies chest pain, shortness of breath, fevers.  Denies nausea, vomiting.  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  Past medical history significant for stroke, hypertension, anxiety, depression  HPI     Home Medications Prior to Admission medications   Medication Sig Start Date End Date Taking? Authorizing Provider  albuterol (VENTOLIN HFA) 108 (90 Base) MCG/ACT inhaler  06/09/19   [provider]  amLODipine (NORVASC) 10 MG tablet Take 1 tablet (10 mg total) by mouth daily. 08/17/19 09/16/19  Azucena Fallen, MD  amLODipine (NORVASC) 10 MG tablet 1 tab(s) orally once a day for 90 days 09/29/20   [provider]  ARIPiprazole (ABILIFY) 5 MG tablet Take 1 tablet by mouth once daily 11/13/21   Thresa Ross, MD  colchicine 0.6 MG tablet Take 2 tablets by mouth and then a third tablet by mouth one hour later 06/28/21   Pollyann Savoy, MD  colchicine 0.6 MG tablet 1 tab(s) orally once a day    [provider]  diclofenac (VOLTAREN) 75 MG EC tablet 1 tab(s) orally 2 times a day for 30 days 02/15/17   [provider]  gabapentin (NEURONTIN) 100 MG capsule 2 cap(s) orally 3 times a day for 30 day(s) 07/05/21   [provider]  hydrOXYzine  (VISTARIL) 50 MG capsule Take 50 mg by mouth 2 (two) times daily as needed. Patient not taking: Reported on 07/12/2021 05/23/19   [provider]  ibuprofen (ADVIL) 600 MG tablet Take 1 tablet (600 mg total) by mouth 3 (three) times daily. Patient not taking: Reported on 07/12/2021 06/28/21   Pollyann Savoy, MD  lamoTRIgine (LAMICTAL) 100 MG tablet Take 1 tablet (100 mg total) by mouth daily. 10/20/21   Thresa Ross, MD  lisinopril-hydrochlorothiazide (ZESTORETIC) 20-12.5 MG tablet Take 2 tablets by mouth daily. 05/17/20   [provider]  naproxen (NAPROSYN) 500 MG tablet Take 1 tablet (500 mg total) by mouth 2 (two) times daily. 03/15/22   Melene Plan, DO  valACYclovir (VALTREX) 1000 MG tablet Take 1,000 mg by mouth 2 (two) times daily. Patient not taking: Reported on 07/12/2021 06/09/19   [provider]  venlafaxine (EFFEXOR) 37.5 MG tablet TAKE ONE TABLET BY MOUTH TWICE DAILY. START WITH ONE TABLET DAILY FOR 5 DAYS, THEN INCREASE TO TWICE DAILY 11/21/21   Thresa Ross, MD  vortioxetine HBr (TRINTELLIX) 10 MG TABS tablet Take 1 tablet (10 mg total) by mouth daily. 10/20/21   Thresa Ross, MD      Allergies    Patient has no known allergies.    Review of Systems   Review of Systems  Physical Exam Updated Vital Signs BP (!) 163/109   Pulse 67   Temp  98.3 F (36.8 C) (Oral)   Resp 19   Ht 6\' 1"  (1.854 m)   Wt 121.1 kg   SpO2 96%   BMI 35.23 kg/m  Physical Exam Vitals and nursing note reviewed.  Constitutional:      General: He is not in acute distress.    Appearance: He is well-developed.  HENT:     Head: Normocephalic and atraumatic.     Mouth/Throat:     Mouth: Mucous membranes are moist.  Eyes:     Conjunctiva/sclera: Conjunctivae normal.  Cardiovascular:     Rate and Rhythm: Normal rate and regular rhythm.     Heart sounds: No murmur heard. Pulmonary:     Effort: Pulmonary effort is normal. No respiratory distress.     Breath sounds: Normal  breath sounds.  Abdominal:     Palpations: Abdomen is soft.     Tenderness: There is no abdominal tenderness.  Musculoskeletal:        General: No swelling.     Cervical back: Neck supple.  Skin:    General: Skin is warm and dry.     Capillary Refill: Capillary refill takes less than 2 seconds.     Comments: Small area of induration under the scrotum on the right side.  No fluctuance, drainage, surrounding erythema.  No tenderness to the area  Neurological:     Mental Status: He is alert and oriented to person, place, and time.  Psychiatric:        Mood and Affect: Mood normal.     ED Results / Procedures / Treatments   Labs (all labs ordered are listed, but only abnormal results are displayed) Labs Reviewed  COMPREHENSIVE METABOLIC PANEL - Abnormal; Notable for the following components:      Result Value   Glucose, Bld 159 (*)    Calcium 8.8 (*)    All other components within normal limits  URINALYSIS, ROUTINE W REFLEX MICROSCOPIC - Abnormal; Notable for the following components:   Glucose, UA 250 (*)    Protein, ur 30 (*)    All other components within normal limits  CBC WITH DIFFERENTIAL/PLATELET  URINALYSIS, MICROSCOPIC (REFLEX)  TROPONIN I (HIGH SENSITIVITY)  TROPONIN I (HIGH SENSITIVITY)    EKG EKG Interpretation  Date/Time:  Sunday May 21 2022 09:28:09 EDT Ventricular Rate:  82 PR Interval:  160 QRS Duration: 99 QT Interval:  371 QTC Calculation: 434 R Axis:   33 Text Interpretation: Sinus rhythm RSR' in V1 or V2, right VCD or RVH No significant change was found Confirmed by Glynn Octave [phone]) on 05/21/2022 9:31:05 AM  Radiology CT ANGIO HEAD NECK W WO CM  Result Date: 05/21/2022 CLINICAL DATA:  57 year old male with vertigo, palpitations, headache, lightheadedness, body ache, groin abscess. EXAM: CT ANGIOGRAPHY HEAD AND NECK WITH AND WITHOUT CONTRAST TECHNIQUE: Multidetector CT imaging of the head and neck was performed using the standard protocol during  bolus administration of intravenous contrast. Multiplanar CT image reconstructions and MIPs were obtained to evaluate the vascular anatomy. Carotid stenosis measurements (when applicable) are obtained utilizing NASCET criteria, using the distal internal carotid diameter as the denominator. RADIATION DOSE REDUCTION: This exam was performed according to the departmental dose-optimization program which includes automated exposure control, adjustment of the mA and/or kV according to patient size and/or use of iterative reconstruction technique. CONTRAST:  75mL OMNIPAQUE IOHEXOL 350 MG/ML SOLN COMPARISON:  Brain MRI 08/16/2019.  Head CT 08/15/2019. Neck CT 03/19/2019. FINDINGS: CT HEAD Brain: Cerebral volume remains normal. No midline  shift, ventriculomegaly, mass effect, evidence of mass lesion, intracranial hemorrhage or evidence of cortically based acute infarction. Stable gray-white differentiation within normal limits for age. Faint basal ganglia vascular calcifications are chronic. Calvarium and skull base: No acute osseous abnormality identified. Paranasal sinuses: Tympanic cavities, Visualized paranasal sinuses and mastoids are stable and well aerated. Orbits: No acute orbit or scalp soft tissue finding. CTA NECK Skeleton: Absent maxillary dentition. Ordinary cervical spine degeneration. Superimposed visible thoracic spine greater flowing endplate osteophytes likely resulting in upper thoracic ankylosis. No acute osseous abnormality identified. Upper chest: Mild atelectasis, otherwise negative. Other neck: No acute finding. Aortic arch: Calcified aortic atherosclerosis. Three vessel arch configuration. Right carotid system: Left subclavian vein streak artifact. Brachiocephalic artery and right CCA origin appear within normal limits. Negative right CCA. Minimal soft and calcified plaque at the medial right carotid bifurcation. No stenosis. Left carotid system: Dense left subclavian venous streak artifact, proximal  left CCA within normal limits. Soft and calcified plaque at the left ICA origin and bulb with less than 50 % stenosis with respect to the distal vessel (series 14, image 180). Vertebral arteries: Proximal right subclavian artery and right vertebral artery origin are within normal limits. Some right V1 paravertebral venous contrast limits the artery detail there. But the right vertebral artery appears to be patent and within normal limits to the skull base, mildly dominant. Proximal left subclavian artery is tortuous without stenosis. Left vertebral artery origin is obscured by regional dense venous contrast streak artifact. But the distal V1 segment is patent and within normal limits. Non dominant left vertebral artery remains patent to the skull base with no significant plaque or stenosis identified. CTA HEAD Posterior circulation: Patent distal vertebral arteries, the right is dominant. Patent vertebrobasilar junction. No significant distal vertebral or basilar artery plaque or stenosis. Normal right PICA origin, left AICA appears dominant. Patent SCA and PCA origins, fetal type left PCA. Right posterior communicating artery diminutive or absent. Bilateral PCA branches are within normal limits. Anterior circulation: Both ICA siphons are patent. Minimal bilateral siphon calcified plaque without stenosis. Left posterior communicating artery is present, normal. Patent carotid termini, MCA and ACA origins. Tortuous A1 segments. Diminutive or absent anterior communicating artery. Bilateral PCA branches are within normal limits. Left MCA M1 segment bifurcates early without stenosis. Right MCA M1 segment and bifurcation are patent without stenosis. Bilateral MCA branches are within normal limits. Venous sinuses: Patent. Anatomic variants: Dominant right vertebral artery. Review of the MIP images confirms the above findings IMPRESSION: 1. Negative for large vessel occlusion. Mild for age atherosclerosis in the head and  neck, most pronounced at the left ICA origin. No significant arterial stenosis. 2. Stable and normal for age CT appearance of the brain. 3.  Aortic Atherosclerosis (ICD10-I70.0). Electronically Signed   By: Odessa Fleming M.D.   On: 05/21/2022 13:03   DG Chest 2 View  Result Date: 05/21/2022 CLINICAL DATA:  57 year old male with history of palpitations and weakness. EXAM: CHEST - 2 VIEW COMPARISON:  Chest x-ray 06/17/2021. FINDINGS: Lung volumes are normal. No consolidative airspace disease. No pleural effusions. No pneumothorax. No pulmonary nodule or mass noted. Pulmonary vasculature and the cardiomediastinal silhouette are within normal limits. Atherosclerosis in the thoracic aorta. IMPRESSION: 1.  No radiographic evidence of acute cardiopulmonary disease. 2. Aortic atherosclerosis. Electronically Signed   By: Trudie Reed M.D.   On: 05/21/2022 10:14    Procedures Procedures    Medications Ordered in ED Medications  iohexol (OMNIPAQUE) 350 MG/ML injection 75 mL (  75 mLs Intravenous Contrast Given 05/21/22 1226)    ED Course/ Medical Decision Making/ A&P                             Medical Decision Making Amount and/or Complexity of Data Reviewed Labs: ordered. Radiology: ordered. ECG/medicine tests: ordered.  Risk Prescription drug management.   This patient presents to the ED for concern of body aches, this involves an extensive number of treatment options, and is a complaint that carries with it a high risk of complications and morbidity.  The differential diagnosis includes COVID, influenza, other viral illness, sepsis, others   Co morbidities that complicate the patient evaluation  History of skin infection, hypertension, anxiety    Lab Tests:  I Ordered, and personally interpreted labs.  The pertinent results include: Grossly unremarkable CBC, CMP, UA negative troponins x 2   Imaging Studies ordered:  I ordered imaging studies including CT angio head neck with and  without contrast media, chest x-ray I independently visualized and interpreted imaging which showed  1. Negative for large vessel occlusion. Mild for age atherosclerosis  in the head and neck, most pronounced at the left ICA origin. No  significant arterial stenosis.  2. Stable and normal for age CT appearance of the brain.  3.  Aortic Atherosclerosis  No acute findings in the chest I agree with the radiologist interpretation   Cardiac Monitoring: / EKG:  The patient was maintained on a cardiac monitor.  I personally viewed and interpreted the cardiac monitored which showed an underlying rhythm of: Sinus rhythm    Test / Admission - Considered:  Patient with no acute findings on head CT or chest x-ray.  No dysrhythmia noted.  Negative troponins x 2.  No abnormality noted in patient's lab work.  Unclear cause of patient's intermittent lightheadedness, but no CVA or acute finding on CT angio head neck.  Orthostatic changes were negative.  I did evaluate the patient's skin in the scrotal region.  There was induration but no signs of draining, abscess, Fournier's gangrene.  There is no tenderness to the area.  No indication for further emergent workup of this area at this time.  Plan to discharge home at this time with recommendations for follow-up with primary care.  Return precautions provided.        Final Clinical Impression(s) / ED Diagnoses Final diagnoses:  Lightheadedness  Generalized body aches    Rx / DC Orders ED Discharge Orders     None         Pamala Duffel 05/21/22 1321    Glynn Octave, MD 05/21/22 1702

## 2022-05-21 NOTE — ED Triage Notes (Signed)
Pt reports he had an abscess in his groin about a month ago that has begun to heal, but has body aches, headaches, lightheadedness, and intermittent palpitations.

## 2022-05-21 NOTE — ED Notes (Signed)
On stretcher, supine for 10 min for orthostatic VS

## 2022-05-21 NOTE — ED Notes (Signed)
Patient transported to X-ray 

## 2022-09-16 ENCOUNTER — Encounter (HOSPITAL_BASED_OUTPATIENT_CLINIC_OR_DEPARTMENT_OTHER): Payer: Self-pay

## 2022-09-16 ENCOUNTER — Other Ambulatory Visit: Payer: Self-pay

## 2022-09-16 ENCOUNTER — Emergency Department (HOSPITAL_BASED_OUTPATIENT_CLINIC_OR_DEPARTMENT_OTHER): Payer: PRIVATE HEALTH INSURANCE

## 2022-09-16 ENCOUNTER — Emergency Department (HOSPITAL_BASED_OUTPATIENT_CLINIC_OR_DEPARTMENT_OTHER)
Admission: EM | Admit: 2022-09-16 | Discharge: 2022-09-16 | Disposition: A | Discharge status: Home / Self Care | Payer: PRIVATE HEALTH INSURANCE | Attending: Emergency Medicine | Admitting: Emergency Medicine

## 2022-09-16 DIAGNOSIS — I10 Essential (primary) hypertension: Secondary | ICD-10-CM | POA: Insufficient documentation

## 2022-09-16 DIAGNOSIS — R0602 Shortness of breath: Secondary | ICD-10-CM | POA: Diagnosis present

## 2022-09-16 DIAGNOSIS — Z79899 Other long term (current) drug therapy: Secondary | ICD-10-CM | POA: Diagnosis not present

## 2022-09-16 DIAGNOSIS — J069 Acute upper respiratory infection, unspecified: Secondary | ICD-10-CM | POA: Diagnosis not present

## 2022-09-16 DIAGNOSIS — Z20822 Contact with and (suspected) exposure to covid-19: Secondary | ICD-10-CM | POA: Insufficient documentation

## 2022-09-16 LAB — SARS CORONAVIRUS 2 BY RT PCR: SARS Coronavirus 2 by RT PCR: NEGATIVE

## 2022-09-16 MED ORDER — ALBUTEROL SULFATE HFA 108 (90 BASE) MCG/ACT IN AERS: 2.0000 | INHALATION_SPRAY | RESPIRATORY_TRACT | Status: DC | PRN

## 2022-09-16 NOTE — ED Notes (Signed)
RT at bedside.

## 2022-09-16 NOTE — ED Provider Notes (Signed)
[place] [facility] Provider Note   CSN: [id number] Arrival date & time: 09/16/22  1137     History  Chief Complaint  Patient presents with   Shortness of Breath   Cough    Randy Rose is a 57 y.o. male with history of hypertension, hyperlipidemia, who presents with concern for bodyaches, headache, fatigue that started yesterday.  He states 3 coworkers have tested positive for COVID and he wanted to be tested for COVID today.  He denies any shortness of breath, fever or chills, nausea or vomiting, diarrhea.  Denies any sore throat.   Shortness of Breath Associated symptoms: cough   Associated symptoms: no fever   Cough Associated symptoms: no fever and no shortness of breath        Home Medications Prior to Admission medications   Medication Sig Start Date End Date Taking? Authorizing Provider  albuterol (VENTOLIN HFA) 108 (90 Base) MCG/ACT inhaler  06/09/19   [provider]  amLODipine (NORVASC) 10 MG tablet Take 1 tablet (10 mg total) by mouth daily. 08/17/19 09/16/19  Azucena Fallen, MD  amLODipine (NORVASC) 10 MG tablet 1 tab(s) orally once a day for 90 days 09/29/20   [provider]  ARIPiprazole (ABILIFY) 5 MG tablet Take 1 tablet by mouth once daily 11/13/21   Thresa Ross, MD  colchicine 0.6 MG tablet Take 2 tablets by mouth and then a third tablet by mouth one hour later 06/28/21   Pollyann Savoy, MD  colchicine 0.6 MG tablet 1 tab(s) orally once a day    [provider]  diclofenac (VOLTAREN) 75 MG EC tablet 1 tab(s) orally 2 times a day for 30 days 02/15/17   [provider]  gabapentin (NEURONTIN) 100 MG capsule 2 cap(s) orally 3 times a day for 30 day(s) 07/05/21   [provider]  hydrOXYzine (VISTARIL) 50 MG capsule Take 50 mg by mouth 2 (two) times daily as needed. Patient not taking: Reported on 07/12/2021 05/23/19   [provider]  ibuprofen (ADVIL) 600 MG  tablet Take 1 tablet (600 mg total) by mouth 3 (three) times daily. Patient not taking: Reported on 07/12/2021 06/28/21   Pollyann Savoy, MD  lamoTRIgine (LAMICTAL) 100 MG tablet Take 1 tablet (100 mg total) by mouth daily. 10/20/21   Thresa Ross, MD  lisinopril-hydrochlorothiazide (ZESTORETIC) 20-12.5 MG tablet Take 2 tablets by mouth daily. 05/17/20   [provider]  naproxen (NAPROSYN) 500 MG tablet Take 1 tablet (500 mg total) by mouth 2 (two) times daily. 03/15/22   Melene Plan, DO  valACYclovir (VALTREX) 1000 MG tablet Take 1,000 mg by mouth 2 (two) times daily. Patient not taking: Reported on 07/12/2021 06/09/19   [provider]  venlafaxine (EFFEXOR) 37.5 MG tablet TAKE ONE TABLET BY MOUTH TWICE DAILY. START WITH ONE TABLET DAILY FOR 5 DAYS, THEN INCREASE TO TWICE DAILY 11/21/21   Thresa Ross, MD  vortioxetine HBr (TRINTELLIX) 10 MG TABS tablet Take 1 tablet (10 mg total) by mouth daily. 10/20/21   Thresa Ross, MD      Allergies    Patient has no known allergies.    Review of Systems   Review of Systems  Constitutional:  Negative for fever.  Respiratory:  Positive for cough. Negative for shortness of breath.     Physical Exam Updated Vital Signs BP (!) 157/94   Pulse 80   Temp 98.8 F (37.1 C) (Oral)   Resp 20  Ht 6\' 1"  (1.854 m)   Wt 117.9 kg   SpO2 96%   BMI 34.30 kg/m  Physical Exam Vitals and nursing note reviewed.  Constitutional:      General: He is not in acute distress.    Appearance: He is well-developed.  HENT:     Head: Normocephalic and atraumatic.     Mouth/Throat:     Pharynx: No oropharyngeal exudate or posterior oropharyngeal erythema.  Eyes:     Conjunctiva/sclera: Conjunctivae normal.  Cardiovascular:     Rate and Rhythm: Normal rate and regular rhythm.     Heart sounds: No murmur heard. Pulmonary:     Effort: Pulmonary effort is normal. No respiratory distress.     Breath sounds: Normal breath sounds.  Abdominal:      Palpations: Abdomen is soft.     Tenderness: There is no abdominal tenderness.  Musculoskeletal:        General: No swelling.     Cervical back: Neck supple.  Skin:    General: Skin is warm and dry.     Capillary Refill: Capillary refill takes less than 2 seconds.  Neurological:     Mental Status: He is alert.  Psychiatric:        Mood and Affect: Mood normal.     ED Results / Procedures / Treatments   Labs (all labs ordered are listed, but only abnormal results are displayed) Labs Reviewed  SARS CORONAVIRUS 2 BY RT PCR    EKG EKG Interpretation Date/Time:  Saturday September 16 2022 11:55:50 EDT Ventricular Rate:  91 PR Interval:  155 QRS Duration:  90 QT Interval:  363 QTC Calculation: 447 R Axis:   31  Text Interpretation: Sinus rhythm RSR' in V1 or V2, right VCD or RVH Baseline wander in lead(s) V2 No significant change since prior 5/24 Confirmed by Meridee Score [phone]) on 09/16/2022 2:56:58 PM  Radiology DG Chest 2 View  Result Date: 09/16/2022 CLINICAL DATA:  Shortness of breath EXAM: CHEST - 2 VIEW COMPARISON:  05/21/2022 FINDINGS: The heart size and mediastinal contours are within normal limits. Both lungs are clear. The visualized skeletal structures are unremarkable. IMPRESSION: No active cardiopulmonary disease. Electronically Signed   By: Kennith Center M.D.   On: 09/16/2022 12:40    Procedures Procedures    Medications Ordered in ED Medications  albuterol (VENTOLIN HFA) 108 (90 Base) MCG/ACT inhaler 2 puff (has no administration in time range)    ED Course/ Medical Decision Making/ A&P                                 Medical Decision Making Amount and/or Complexity of Data Reviewed Radiology: ordered.  Risk Prescription drug management.   57 y.o. male with pertinent past medical history of hypertension, hyperlipidemia presents to the ED for concern of fatigue, body aches, headache for 1 day, known COVID exposure  Differential diagnosis includes  but is not limited to COVID, URI, pneumonia  ED Course:  Patient has concern for COVID exposure at work.  He notes fatigue, body aches, headache for 1 day.  COVID test here is negative.  Chest x-ray without any signs of pneumonia, he does not have any fever or chills, he is satting 98% on room air upon my examination, low suspicion for pneumonia at this time.  EKG shows normal sinus rhythm.  I suspect a viral URI.  He states the most concerning symptom is his  headache which feels like a pressure behind his eyes.  Discussed that he may use Flonase and ibuprofen at home to help with the symptoms.   Impression: Viral upper respiratory infection  Disposition:  The patient was discharged home with instructions to take ibuprofen to help with headache and may use Flonase to help with the nasal congestion and sinus pressure.  Follow-up with his PCP within the next week if his symptoms have not started to improve. Return precautions given.  Lab Tests: I Ordered, and personally interpreted labs.  The pertinent results include:   COVID-negative  Imaging Studies ordered: I ordered imaging studies including x-ray I independently visualized the imaging with scope of interpretation limited to determining acute life threatening conditions related to emergency care. Imaging showed no consolidations, pleural effusions, pneumothorax I agree with the radiologist interpretation   Cardiac Monitoring: / EKG: The patient was maintained on a cardiac monitor.  I personally viewed and interpreted the cardiac monitored which showed an underlying rhythm of: Normal sinus rhythm   Co morbidities that complicate the patient evaluation  Hypertension, hyperlipidemia              Final Clinical Impression(s) / ED Diagnoses Final diagnoses:  Viral upper respiratory tract infection    Rx / DC Orders ED Discharge Orders     None         Arabella Merles, PA-C 09/16/22 1541    Terrilee Files, MD 09/17/22 1020

## 2022-09-16 NOTE — ED Triage Notes (Signed)
The patient was exposed to covid three days ago. He is having cough and shortness of breath, headache.

## 2022-09-16 NOTE — Discharge Instructions (Addendum)
Your COVID test was negative today.  Your symptoms are likely due to a viral illness.  Please continue handwashing and wear a mask around others until your symptoms resolve.  You may use up to 800mg  ibuprofen every 8 hours as needed for headache.  Do not exceed 2.4g of ibuprofen per day.  As discussed you may use Flonase (fluticasone) to help with your nasal congestion. This may also help with the pressure you feel behind your eyes. This is an over-the-counter medication you may pick up at any drugstore.  Spray 2 puffs into each nostril daily until your nasal congestion and eye pressure resolves.  Return to the ER if you have any difficulty breathing, fever that does not respond to Tylenol, any other new or concerning symptoms.

## 2022-10-05 ENCOUNTER — Emergency Department (HOSPITAL_BASED_OUTPATIENT_CLINIC_OR_DEPARTMENT_OTHER): Payer: PRIVATE HEALTH INSURANCE

## 2022-10-05 ENCOUNTER — Other Ambulatory Visit: Payer: Self-pay

## 2022-10-05 ENCOUNTER — Encounter (HOSPITAL_BASED_OUTPATIENT_CLINIC_OR_DEPARTMENT_OTHER): Payer: Self-pay | Admitting: Emergency Medicine

## 2022-10-05 ENCOUNTER — Emergency Department (HOSPITAL_BASED_OUTPATIENT_CLINIC_OR_DEPARTMENT_OTHER)
Admission: EM | Admit: 2022-10-05 | Discharge: 2022-10-05 | Discharge status: Against Medical Advice | Payer: PRIVATE HEALTH INSURANCE | Attending: Emergency Medicine | Admitting: Emergency Medicine

## 2022-10-05 DIAGNOSIS — R0789 Other chest pain: Secondary | ICD-10-CM | POA: Insufficient documentation

## 2022-10-05 DIAGNOSIS — R5383 Other fatigue: Secondary | ICD-10-CM | POA: Insufficient documentation

## 2022-10-05 DIAGNOSIS — R519 Headache, unspecified: Secondary | ICD-10-CM | POA: Insufficient documentation

## 2022-10-05 DIAGNOSIS — Z5321 Procedure and treatment not carried out due to patient leaving prior to being seen by health care provider: Secondary | ICD-10-CM | POA: Diagnosis not present

## 2022-10-05 DIAGNOSIS — R12 Heartburn: Secondary | ICD-10-CM | POA: Diagnosis not present

## 2022-10-05 DIAGNOSIS — M549 Dorsalgia, unspecified: Secondary | ICD-10-CM | POA: Diagnosis not present

## 2022-10-05 NOTE — ED Triage Notes (Signed)
Pt reports stinging pain with flesh colored bumps along the left chest and back, reports heartburn, low energy, chest tightness, HA  Denies ShoB, dizziness, n/v

## 2022-10-18 ENCOUNTER — Encounter (HOSPITAL_BASED_OUTPATIENT_CLINIC_OR_DEPARTMENT_OTHER): Payer: Self-pay

## 2022-10-18 ENCOUNTER — Emergency Department (HOSPITAL_BASED_OUTPATIENT_CLINIC_OR_DEPARTMENT_OTHER)
Admission: EM | Admit: 2022-10-18 | Discharge: 2022-10-18 | Disposition: A | Discharge status: Home / Self Care | Payer: PRIVATE HEALTH INSURANCE | Attending: Emergency Medicine | Admitting: Emergency Medicine

## 2022-10-18 ENCOUNTER — Other Ambulatory Visit: Payer: Self-pay

## 2022-10-18 ENCOUNTER — Telehealth (HOSPITAL_BASED_OUTPATIENT_CLINIC_OR_DEPARTMENT_OTHER): Payer: Self-pay | Admitting: Emergency Medicine

## 2022-10-18 ENCOUNTER — Emergency Department (HOSPITAL_BASED_OUTPATIENT_CLINIC_OR_DEPARTMENT_OTHER): Payer: PRIVATE HEALTH INSURANCE

## 2022-10-18 DIAGNOSIS — I10 Essential (primary) hypertension: Secondary | ICD-10-CM | POA: Diagnosis not present

## 2022-10-18 DIAGNOSIS — J029 Acute pharyngitis, unspecified: Secondary | ICD-10-CM | POA: Insufficient documentation

## 2022-10-18 DIAGNOSIS — Z20822 Contact with and (suspected) exposure to covid-19: Secondary | ICD-10-CM | POA: Diagnosis not present

## 2022-10-18 DIAGNOSIS — Z79899 Other long term (current) drug therapy: Secondary | ICD-10-CM | POA: Diagnosis not present

## 2022-10-18 DIAGNOSIS — R070 Pain in throat: Secondary | ICD-10-CM | POA: Diagnosis present

## 2022-10-18 LAB — SARS CORONAVIRUS 2 BY RT PCR: SARS Coronavirus 2 by RT PCR: NEGATIVE

## 2022-10-18 LAB — GROUP A STREP BY PCR: Group A Strep by PCR: NOT DETECTED

## 2022-10-18 MED ORDER — GUAIFENESIN-DM 100-10 MG/5ML PO SYRP: 5.0000 mL | ORAL_SOLUTION | ORAL | 0 refills | Status: DC | PRN

## 2022-10-18 MED ORDER — DEXAMETHASONE SODIUM PHOSPHATE 10 MG/ML IJ SOLN
10.0000 mg | Freq: Once | INTRAMUSCULAR | Status: AC
  Administered 2022-10-18: 10 mg
  Filled 2022-10-18: qty 1

## 2022-10-18 MED ORDER — LIDOCAINE VISCOUS HCL 2 % MT SOLN
15.0000 mL | Freq: Once | OROMUCOSAL | Status: AC
  Administered 2022-10-18: 15 mL via OROMUCOSAL
  Filled 2022-10-18: qty 15

## 2022-10-18 MED ORDER — LIDOCAINE VISCOUS HCL 2 % MT SOLN: 15.0000 mL | OROMUCOSAL | 0 refills | Status: DC | PRN

## 2022-10-18 MED ORDER — PREDNISONE 10 MG (21) PO TBPK: ORAL_TABLET | Freq: Every day | ORAL | 0 refills | Status: DC

## 2022-10-18 MED ORDER — KETOROLAC TROMETHAMINE 60 MG/2ML IM SOLN
15.0000 mg | Freq: Once | INTRAMUSCULAR | Status: AC
  Administered 2022-10-18: 15 mg via INTRAMUSCULAR
  Filled 2022-10-18: qty 2

## 2022-10-18 MED ORDER — AMOXICILLIN-POT CLAVULANATE 875-125 MG PO TABS: 1.0000 | ORAL_TABLET | Freq: Two times a day (BID) | ORAL | 0 refills | Status: DC

## 2022-10-18 NOTE — ED Provider Notes (Signed)
[facility] Provider Note  CSN: [id number] Arrival date & time: 10/18/22 0126  Chief Complaint(s) Cough and Sore Throat  HPI Randy Rose is a 57 y.o. male with past medical history as below, significant for htn hld cva who presents to the ED with complaint of throat pain   Feeling unwell for 2 days, coughing, increased mucus production, sore throat, pain with swallowing.  No fevers but is having some chills, no vomiting.  Intermittent diarrhea.  No BRBPR or melena.  Intermittent body aches.  Took TheraFlu at home without much relief of symptoms.  No sick contacts  Past Medical History Past Medical History:  Diagnosis Date   Depression    Herpes simplex type 2 infection    Hyperlipidemia    Hypertension    Patient Active Problem List   Diagnosis Date Noted   Synovitis of left knee 03/16/2022   Neurosensory deficit 08/16/2019   Essential hypertension 08/16/2019   Anxiety 08/16/2019   Stroke (HCC) 08/15/2019   Home Medication(s) Prior to Admission medications   Medication Sig Start Date End Date Taking? Authorizing Provider  amoxicillin-clavulanate (AUGMENTIN) 875-125 MG tablet Take 1 tablet by mouth every 12 (twelve) hours. 10/18/22  Yes Tanda Rockers A, DO  guaiFENesin-dextromethorphan (ROBITUSSIN DM) 100-10 MG/5ML syrup Take 5 mLs by mouth every 4 (four) hours as needed for cough. 10/18/22  Yes Tanda Rockers A, DO  predniSONE (STERAPRED UNI-PAK 21 TAB) 10 MG (21) TBPK tablet Take by mouth daily. Take 6 tabs by mouth daily  for 2 days, then 5 tabs for 2 days, then 4 tabs for 2 days, then 3 tabs for 2 days, 2 tabs for 2 days, then 1 tab by mouth daily for 2 days 10/18/22  Yes Tanda Rockers A, DO  albuterol (VENTOLIN HFA) 108 [phone] Base) MCG/ACT inhaler  06/09/19   [provider]  amLODipine (NORVASC) 10 MG tablet Take 1 tablet (10 mg total) by mouth daily. 08/17/19 09/16/19  Azucena Fallen, MD  amLODipine (NORVASC) 10 MG tablet 1  tab(s) orally once a day for 90 days 09/29/20   [provider]  ARIPiprazole (ABILIFY) 5 MG tablet Take 1 tablet by mouth once daily 11/13/21   Thresa Ross, MD  colchicine 0.6 MG tablet Take 2 tablets by mouth and then a third tablet by mouth one hour later 06/28/21   Pollyann Savoy, MD  colchicine 0.6 MG tablet 1 tab(s) orally once a day    [provider]  diclofenac (VOLTAREN) 75 MG EC tablet 1 tab(s) orally 2 times a day for 30 days 02/15/17   [provider]  gabapentin (NEURONTIN) 100 MG capsule 2 cap(s) orally 3 times a day for 30 day(s) 07/05/21   [provider]  hydrOXYzine (VISTARIL) 50 MG capsule Take 50 mg by mouth 2 (two) times daily as needed. Patient not taking: Reported on 07/12/2021 05/23/19   [provider]  ibuprofen (ADVIL) 600 MG tablet Take 1 tablet (600 mg total) by mouth 3 (three) times daily. Patient not taking: Reported on 07/12/2021 06/28/21   Pollyann Savoy, MD  lamoTRIgine (LAMICTAL) 100 MG tablet Take 1 tablet (100 mg total) by mouth daily. 10/20/21   Thresa Ross, MD  lidocaine (XYLOCAINE) 2 % solution Use as directed 15 mLs in the mouth or throat as needed (sore throat). Gargle and spit 10/18/22   Sloan Leiter, DO  lisinopril-hydrochlorothiazide (ZESTORETIC) 20-12.5 MG tablet Take 2 tablets by mouth daily. 05/17/20  [provider]  naproxen (NAPROSYN) 500 MG tablet Take 1 tablet (500 mg total) by mouth 2 (two) times daily. 03/15/22   Melene Plan, DO  valACYclovir (VALTREX) 1000 MG tablet Take 1,000 mg by mouth 2 (two) times daily. Patient not taking: Reported on 07/12/2021 06/09/19   [provider]  venlafaxine (EFFEXOR) 37.5 MG tablet TAKE ONE TABLET BY MOUTH TWICE DAILY. START WITH ONE TABLET DAILY FOR 5 DAYS, THEN INCREASE TO TWICE DAILY 11/21/21   Thresa Ross, MD  vortioxetine HBr (TRINTELLIX) 10 MG TABS tablet Take 1 tablet (10 mg total) by mouth daily. 10/20/21   Thresa Ross, MD                                                                                                                                     Past Surgical History History reviewed. No pertinent surgical history. Family History Family History  Problem Relation Age of Onset   Hypertension Mother    Hyperlipidemia Mother    Hyperlipidemia Father    Hypertension Father     Social History Social History   Tobacco Use   Smoking status: Never   Smokeless tobacco: Never  Vaping Use   Vaping status: Never Used  Substance Use Topics   Alcohol use: No   Drug use: No   Allergies Patient has no known allergies.  Review of Systems Review of Systems  Constitutional:  Positive for chills. Negative for fever.  HENT:  Positive for congestion, sore throat and trouble swallowing.   Respiratory:  Positive for cough. Negative for chest tightness and shortness of breath.   Cardiovascular:  Negative for chest pain and palpitations.  Gastrointestinal:  Negative for abdominal pain.  Musculoskeletal:  Positive for arthralgias.  All other systems reviewed and are negative.   Physical Exam Vital Signs  I have reviewed the triage vital signs BP (!) 154/94 (BP Location: Left Arm)   Pulse 84   Temp 98.2 F (36.8 C) (Oral)   Resp 18   Ht 6\' 1"  (1.854 m)   Wt 118.8 kg   SpO2 98%   BMI 34.57 kg/m  Physical Exam Vitals and nursing note reviewed.  Constitutional:      General: He is not in acute distress.    Appearance: He is well-developed.  HENT:     Head: Normocephalic and atraumatic.     Comments: No drooling trismus or stridor    Right Ear: External ear normal.     Left Ear: External ear normal.     Mouth/Throat:     Mouth: Mucous membranes are moist.     Pharynx: Uvula midline. Posterior oropharyngeal erythema present. No oropharyngeal exudate or uvula swelling.     Comments: No evidence of PTA or deep space infection Eyes:     General: No scleral icterus. Cardiovascular:     Rate and Rhythm: Normal  rate and regular rhythm.  Pulses: Normal pulses.     Heart sounds: Normal heart sounds.  Pulmonary:     Effort: Pulmonary effort is normal. No respiratory distress.  Abdominal:     General: Abdomen is flat.     Palpations: Abdomen is soft.     Tenderness: There is no abdominal tenderness.  Musculoskeletal:     Cervical back: No rigidity.     Right lower leg: No edema.     Left lower leg: No edema.  Skin:    General: Skin is warm and dry.     Capillary Refill: Capillary refill takes less than 2 seconds.  Neurological:     Mental Status: He is alert.  Psychiatric:        Mood and Affect: Mood normal.        Behavior: Behavior normal.     ED Results and Treatments Labs (all labs ordered are listed, but only abnormal results are displayed) Labs Reviewed  SARS CORONAVIRUS 2 BY RT PCR  GROUP A STREP BY PCR                                                                                                                          Radiology DG Chest 2 View  Result Date: 10/18/2022 CLINICAL DATA:  Coughing, sore throat and body aches. EXAM: CHEST - 2 VIEW COMPARISON:  PA and lateral 10/05/2022. FINDINGS: The heart size and mediastinal contours are within normal limits. There is calcification of the transverse aorta. Both lungs are clear. The visualized skeletal structures are intact, with extensive thoracic spine bridging enthesopathy. IMPRESSION: 1. No active cardiopulmonary disease. 2. Aortic atherosclerosis. 3. Thoracic spine bridging enthesopathy. Electronically Signed   By: Almira Bar M.D.   On: 10/18/2022 06:38    Pertinent labs & imaging results that were available during my care of the patient were reviewed by me and considered in my medical decision making (see MDM for details).  Medications Ordered in ED Medications  ketorolac (TORADOL) injection 15 mg (15 mg Intramuscular Given 10/18/22 0606)  dexamethasone (DECADRON) injection 10 mg (10 mg Other Given 10/18/22 0602)   lidocaine (XYLOCAINE) 2 % viscous mouth solution 15 mL (15 mLs Mouth/Throat Given 10/18/22 0602)                                                                                                                                     Procedures  Procedures  (including critical care time)  Medical Decision Making / ED Course    Medical Decision Making:    Jahmarion Popoff is a 57 y.o. male  with past medical history as below, significant for htn hld cva who presents to the ED with complaint of throat pain . The complaint involves an extensive differential diagnosis and also carries with it a high risk of complications and morbidity.  Serious etiology was considered. Ddx includes but is not limited to: PTA, RPA, pharyngitis, tonsillitis, viral syndrome, etc.  Complete initial physical exam performed, notably the patient  was no acute distress, no drooling stridor or trismus.    Reviewed and confirmed nursing documentation for past medical history, family history, social history.  Vital signs reviewed.        COVID swab and strep swab negative.  Chest x-ray stable.  Patient given oral Decadron, viscous lidocaine, Toradol, symptoms improved.  Discussed supportive care at home, will cover with Augmentin.  Recommend close follow-up with PCP  The patient improved significantly and was discharged in stable condition. Detailed discussions were had with the patient regarding current findings, and need for close f/u with PCP or on call doctor. The patient has been instructed to return immediately if the symptoms worsen in any way for re-evaluation. Patient verbalized understanding and is in agreement with current care plan. All questions answered prior to discharge.                   Additional history obtained: -Additional history obtained from na -External records from outside source obtained and reviewed including: Chart review including previous notes, labs, imaging, consultation  notes including  Prior ED visits, home medications   Lab Tests: -I ordered, reviewed, and interpreted labs.   The pertinent results include:   Labs Reviewed  SARS CORONAVIRUS 2 BY RT PCR  GROUP A STREP BY PCR    Notable for swabs negative  EKG   EKG Interpretation Date/Time:    Ventricular Rate:    PR Interval:    QRS Duration:    QT Interval:    QTC Calculation:   R Axis:      Text Interpretation:           Imaging Studies ordered: I ordered imaging studies including chest x-ray I independently visualized the following imaging with scope of interpretation limited to determining acute life threatening conditions related to emergency care; findings noted above, significant for stable imaging I independently visualized and interpreted imaging. I agree with the radiologist interpretation   Medicines ordered and prescription drug management: Meds ordered this encounter  Medications   ketorolac (TORADOL) injection 15 mg   dexamethasone (DECADRON) injection 10 mg   lidocaine (XYLOCAINE) 2 % viscous mouth solution 15 mL   predniSONE (STERAPRED UNI-PAK 21 TAB) 10 MG (21) TBPK tablet    Sig: Take by mouth daily. Take 6 tabs by mouth daily  for 2 days, then 5 tabs for 2 days, then 4 tabs for 2 days, then 3 tabs for 2 days, 2 tabs for 2 days, then 1 tab by mouth daily for 2 days    Dispense:  42 tablet    Refill:  0   guaiFENesin-dextromethorphan (ROBITUSSIN DM) 100-10 MG/5ML syrup    Sig: Take 5 mLs by mouth every 4 (four) hours as needed for cough.    Dispense:  118 mL    Refill:  0   lidocaine (XYLOCAINE) 2 % solution    Sig: Use as directed 15  mLs in the mouth or throat as needed (sore throat). Gargle and spit    Dispense:  100 mL    Refill:  0   amoxicillin-clavulanate (AUGMENTIN) 875-125 MG tablet    Sig: Take 1 tablet by mouth every 12 (twelve) hours.    Dispense:  14 tablet    Refill:  0    -I have reviewed the patients home medicines and have made adjustments  as needed   Consultations Obtained: na   Cardiac Monitoring: Continuous pulse oximetry interpreted by myself, 99% on RA.    Social Determinants of Health:  Diagnosis or treatment significantly limited by social determinants of health: na   Reevaluation: After the interventions noted above, I reevaluated the patient and found that they have improved  Co morbidities that complicate the patient evaluation  Past Medical History:  Diagnosis Date   Depression    Herpes simplex type 2 infection    Hyperlipidemia    Hypertension       Dispostion: Disposition decision including need for hospitalization was considered, and patient discharged from emergency department.    Final Clinical Impression(s) / ED Diagnoses Final diagnoses:  Pharyngitis, unspecified etiology        Sloan Leiter, DO 10/18/22 1610

## 2022-10-18 NOTE — ED Notes (Signed)
Reviewed discharge follow up and meds, pt states understanding. Questions answered

## 2022-10-18 NOTE — Telephone Encounter (Signed)
Patient says that the prednisone and cough syrup were not sent to the pharmacy.  I have sent them over now.

## 2022-10-18 NOTE — ED Triage Notes (Addendum)
Pt states he developed a cough and sore throat C/o body aches and sweats Can not visualize tonsils, tongue is thick and he feels it is swollen

## 2022-10-18 NOTE — Discharge Instructions (Addendum)
It was a pleasure caring for you today in the emergency department.  Please follow-up with your PCP in the next few days for recheck.  Chest x-ray looks okay, no evidence of pneumonia  Consider drinking warm liquids, warm tea with honey to help with your sore throat.  Drink plenty of fluids, take medications as prescribed  Please return to the emergency department for any worsening or worrisome symptoms.

## 2023-02-12 ENCOUNTER — Encounter (HOSPITAL_BASED_OUTPATIENT_CLINIC_OR_DEPARTMENT_OTHER): Payer: Self-pay | Admitting: Emergency Medicine

## 2023-02-12 ENCOUNTER — Other Ambulatory Visit: Payer: Self-pay

## 2023-02-12 ENCOUNTER — Other Ambulatory Visit (HOSPITAL_BASED_OUTPATIENT_CLINIC_OR_DEPARTMENT_OTHER): Payer: Self-pay

## 2023-02-12 ENCOUNTER — Emergency Department (HOSPITAL_BASED_OUTPATIENT_CLINIC_OR_DEPARTMENT_OTHER)
Admission: EM | Admit: 2023-02-12 | Discharge: 2023-02-12 | Disposition: A | Discharge status: Home / Self Care | Payer: Medicaid Other | Attending: Emergency Medicine | Admitting: Emergency Medicine

## 2023-02-12 DIAGNOSIS — H538 Other visual disturbances: Secondary | ICD-10-CM | POA: Insufficient documentation

## 2023-02-12 DIAGNOSIS — I10 Essential (primary) hypertension: Secondary | ICD-10-CM | POA: Insufficient documentation

## 2023-02-12 DIAGNOSIS — R350 Frequency of micturition: Secondary | ICD-10-CM | POA: Diagnosis present

## 2023-02-12 DIAGNOSIS — R739 Hyperglycemia, unspecified: Secondary | ICD-10-CM | POA: Diagnosis not present

## 2023-02-12 LAB — CBG MONITORING, ED
Glucose-Capillary: 327 mg/dL — ABNORMAL HIGH (ref 70–99)
Glucose-Capillary: 252 mg/dL — ABNORMAL HIGH (ref 70–99)

## 2023-02-12 LAB — CBC
HCT: 46.1 % (ref 39.0–52.0)
Hemoglobin: 15.3 g/dL (ref 13.0–17.0)
MCH: 27.9 pg (ref 26.0–34.0)
MCHC: 33.2 g/dL (ref 30.0–36.0)
MCV: 84.1 fL (ref 80.0–100.0)
Platelets: 214 10*3/uL (ref 150–400)
RBC: 5.48 MIL/uL (ref 4.22–5.81)
RDW: 12.6 % (ref 11.5–15.5)
WBC: 5 10*3/uL (ref 4.0–10.5)
nRBC: 0 % (ref 0.0–0.2)

## 2023-02-12 LAB — URINALYSIS, ROUTINE W REFLEX MICROSCOPIC
Bilirubin Urine: NEGATIVE
Glucose, UA: >=500 mg/dL — AB
Ketones, ur: NEGATIVE mg/dL
Leukocytes,Ua: NEGATIVE
Nitrite: NEGATIVE
Protein, ur: 100 mg/dL — AB
Specific Gravity, Urine: 1.02 (ref 1.005–1.030)
pH: 5 (ref 5.0–8.0)

## 2023-02-12 LAB — BASIC METABOLIC PANEL
Anion gap: 14 (ref 5–15)
BUN: 12 mg/dL (ref 6–20)
CO2: 21 mmol/L — ABNORMAL LOW (ref 22–32)
Calcium: 9.3 mg/dL (ref 8.9–10.3)
Chloride: 102 mmol/L (ref 98–111)
Creatinine, Ser: 0.8 mg/dL (ref 0.61–1.24)
GFR, Estimated: >60 mL/min (ref >60)
Glucose, Bld: 325 mg/dL — ABNORMAL HIGH (ref 70–99)
Potassium: 3.7 mmol/L (ref 3.5–5.1)
Sodium: 137 mmol/L (ref 135–145)

## 2023-02-12 LAB — URINALYSIS, MICROSCOPIC (REFLEX)

## 2023-02-12 MED ORDER — INSULIN ASPART 100 UNIT/ML IJ SOLN
5.0000 [IU] | Freq: Once | INTRAMUSCULAR | Status: AC
  Administered 2023-02-12: 5 [IU] via SUBCUTANEOUS

## 2023-02-12 MED ORDER — INSULIN ASPART 100 UNIT/ML IJ SOLN: 5.0000 [IU] | Freq: Once | INTRAMUSCULAR | Status: DC

## 2023-02-12 MED ORDER — METFORMIN HCL 500 MG PO TABS
500.0000 mg | ORAL_TABLET | Freq: Two times a day (BID) | ORAL | 0 refills | Status: DC
  Filled 2023-02-12: qty 60, 30d supply, fill #0

## 2023-02-12 NOTE — ED Triage Notes (Signed)
Urinary frequency x 1 week , dysuria and stinging feeling with urination , right lower abd pain to groin .  Denies penile discharge  Hx pancreatitis .

## 2023-02-12 NOTE — Discharge Instructions (Addendum)
You were seen in the emergency department today with frequent urination.  Your lab work shows elevated blood sugar and new diabetes.  I am starting you on diabetes medication but we will need to follow very closely with your primary care doctor as these medicines often need to be adjusted.  Please adhere to a low carb, low sugar diet to help with your blood sugars.   If you develop abdominal cramping, nausea/vomiting, confusion, fever you should return to the emergency department for reevaluation.

## 2023-02-12 NOTE — ED Provider Notes (Signed)
Emergency Department Provider Note   I have reviewed the triage vital signs and the nursing notes.   HISTORY  Chief Complaint Urinary Frequency   HPI Randy Rose is a 58 y.o. male past history of hypertension and hyperlipidemia presents to the emergency department for evaluation of urine frequency.  Symptoms been ongoing for the past week.  He has some associated blurry vision on and off.  No urethral discharge.  No dysuria, hesitancy, urgency.  He does have a history of pancreatitis but no abdominal pain or vomiting.  He notes that occasionally he will have some pain to the right groin along with some stinging during urination but that is not a persistent symptom.   Past Medical History:  Diagnosis Date   Depression    Herpes simplex type 2 infection    Hyperlipidemia    Hypertension     Review of Systems  Constitutional: No fever/chills Cardiovascular: Denies chest pain. Respiratory: Denies shortness of breath. Gastrointestinal: No abdominal pain.  No nausea, no vomiting.  Genitourinary: Negative for dysuria. Positive urine frequency.  Musculoskeletal: Negative for back pain. Skin: Negative for rash. Neurological: Negative for headaches.  ____________________________________________   PHYSICAL EXAM:  VITAL SIGNS: ED Triage Vitals  Encounter Vitals Group     BP 02/12/23 0913 (!) 182/106     Pulse Rate 02/12/23 0913 97     Resp 02/12/23 0913 18     Temp 02/12/23 0913 98 F (36.7 C)     Temp Source 02/12/23 0913 Oral     SpO2 02/12/23 0913 96 %     Weight 02/12/23 0911 246 lb (111.6 kg)   Constitutional: Alert and oriented. Well appearing and in no acute distress. Eyes: Conjunctivae are normal.  Head: Atraumatic. Nose: No congestion/rhinnorhea. Mouth/Throat: Mucous membranes are moist.  Neck: No stridor.   Cardiovascular: Normal rate, regular rhythm. Good peripheral circulation. Grossly normal heart sounds.   Respiratory: Normal respiratory effort.  No  retractions. Lungs CTAB. Gastrointestinal: Soft and nontender. No distention.  Musculoskeletal: No gross deformities of extremities. Neurologic:  Normal speech and language.  Skin:  Skin is warm, dry and intact. No rash noted.  ____________________________________________   LABS (all labs ordered are listed, but only abnormal results are displayed)  Labs Reviewed  URINALYSIS, ROUTINE W REFLEX MICROSCOPIC - Abnormal; Notable for the following components:      Result Value   Glucose, UA >=500 (*)    Hgb urine dipstick TRACE (*)    Protein, ur 100 (*)    All other components within normal limits  BASIC METABOLIC PANEL - Abnormal; Notable for the following components:   CO2 21 (*)    Glucose, Bld 325 (*)    All other components within normal limits  URINALYSIS, MICROSCOPIC (REFLEX) - Abnormal; Notable for the following components:   Bacteria, UA RARE (*)    All other components within normal limits  CBG MONITORING, ED - Abnormal; Notable for the following components:   Glucose-Capillary 327 (*)    All other components within normal limits  CBG MONITORING, ED - Abnormal; Notable for the following components:   Glucose-Capillary 252 (*)    All other components within normal limits  CBC   ____________________________________________   PROCEDURES  Procedure(s) performed:   Procedures  None  ____________________________________________   INITIAL IMPRESSION / ASSESSMENT AND PLAN / ED COURSE  Pertinent labs & imaging results that were available during my care of the patient were reviewed by me and considered in my medical decision  making (see chart for details).   This patient is Presenting for Evaluation of urinary frequency, which does require a range of treatment options, and is a complaint that involves a high risk of morbidity and mortality.  The Differential Diagnoses include UTI, hyperglycemia, DKA, HHS, etc.  Clinical Laboratory Tests Ordered, included CBC shows no  leukocytosis or anemia.  Glucose elevated to 325 without anion gap or acidosis.  No DKA.  No UTI on UA.  Medical Decision Making: Summary:  Patient presents emergency department urine frequency and found to have hyperglycemia.  Has been told that he is prediabetic in the past.  Suspect this is the cause for his urine frequency as well as occasional blurry vision.  Discussed need for very close PCP follow-up and monitoring, low-carb diet.  Plan to start metformin in the ED.    Patient's presentation is most consistent with acute, uncomplicated illness.   Disposition: discharge  ____________________________________________  FINAL CLINICAL IMPRESSION(S) / ED DIAGNOSES  Final diagnoses:  Hyperglycemia  Urinary frequency     NEW OUTPATIENT MEDICATIONS STARTED DURING THIS VISIT:  Discharge Medication List as of 02/12/2023 11:31 AM     START taking these medications   Details  metFORMIN (GLUCOPHAGE) 500 MG tablet Take 1 tablet (500 mg total) by mouth 2 (two) times daily with a meal., Starting Mon 02/12/2023, Until Wed 03/14/2023, Normal        Note:  This document was prepared using Dragon voice recognition software and may include unintentional dictation errors.  Alona Bene, MD, [facility]    Jarrod Bodkins, Arlyss Repress, MD 02/16/23 [phone]

## 2023-05-31 ENCOUNTER — Emergency Department (HOSPITAL_BASED_OUTPATIENT_CLINIC_OR_DEPARTMENT_OTHER)

## 2023-05-31 ENCOUNTER — Emergency Department (HOSPITAL_BASED_OUTPATIENT_CLINIC_OR_DEPARTMENT_OTHER)
Admission: EM | Admit: 2023-05-31 | Discharge: 2023-05-31 | Disposition: A | Discharge status: Home / Self Care | Attending: Emergency Medicine | Admitting: Emergency Medicine

## 2023-05-31 ENCOUNTER — Other Ambulatory Visit: Payer: Self-pay

## 2023-05-31 ENCOUNTER — Encounter (HOSPITAL_BASED_OUTPATIENT_CLINIC_OR_DEPARTMENT_OTHER): Payer: Self-pay | Admitting: Emergency Medicine

## 2023-05-31 DIAGNOSIS — Z7984 Long term (current) use of oral hypoglycemic drugs: Secondary | ICD-10-CM | POA: Diagnosis not present

## 2023-05-31 DIAGNOSIS — E119 Type 2 diabetes mellitus without complications: Secondary | ICD-10-CM | POA: Diagnosis not present

## 2023-05-31 DIAGNOSIS — I7090 Unspecified atherosclerosis: Secondary | ICD-10-CM

## 2023-05-31 DIAGNOSIS — I700 Atherosclerosis of aorta: Secondary | ICD-10-CM | POA: Diagnosis not present

## 2023-05-31 DIAGNOSIS — R911 Solitary pulmonary nodule: Secondary | ICD-10-CM | POA: Diagnosis not present

## 2023-05-31 DIAGNOSIS — R109 Unspecified abdominal pain: Secondary | ICD-10-CM | POA: Diagnosis present

## 2023-05-31 DIAGNOSIS — M549 Dorsalgia, unspecified: Secondary | ICD-10-CM | POA: Insufficient documentation

## 2023-05-31 DIAGNOSIS — I10 Essential (primary) hypertension: Secondary | ICD-10-CM | POA: Diagnosis not present

## 2023-05-31 DIAGNOSIS — Z79899 Other long term (current) drug therapy: Secondary | ICD-10-CM | POA: Diagnosis not present

## 2023-05-31 DIAGNOSIS — M25561 Pain in right knee: Secondary | ICD-10-CM | POA: Diagnosis not present

## 2023-05-31 LAB — CBC WITH DIFFERENTIAL/PLATELET
Abs Immature Granulocytes: 0.01 10*3/uL (ref 0.00–0.07)
Basophils Absolute: 0 10*3/uL (ref 0.0–0.1)
Basophils Relative: 1 %
Eosinophils Absolute: 0.2 10*3/uL (ref 0.0–0.5)
Eosinophils Relative: 3 %
HCT: 44.1 % (ref 39.0–52.0)
Hemoglobin: 14.2 g/dL (ref 13.0–17.0)
Immature Granulocytes: 0 %
Lymphocytes Relative: 45 %
Lymphs Abs: 2.4 10*3/uL (ref 0.7–4.0)
MCH: 28.3 pg (ref 26.0–34.0)
MCHC: 32.2 g/dL (ref 30.0–36.0)
MCV: 87.8 fL (ref 80.0–100.0)
Monocytes Absolute: 0.4 10*3/uL (ref 0.1–1.0)
Monocytes Relative: 7 %
Neutro Abs: 2.3 10*3/uL (ref 1.7–7.7)
Neutrophils Relative %: 44 %
Platelets: 240 10*3/uL (ref 150–400)
RBC: 5.02 MIL/uL (ref 4.22–5.81)
RDW: 13.8 % (ref 11.5–15.5)
WBC: 5.3 10*3/uL (ref 4.0–10.5)
nRBC: 0 % (ref 0.0–0.2)

## 2023-05-31 LAB — URINALYSIS, ROUTINE W REFLEX MICROSCOPIC
Bilirubin Urine: NEGATIVE
Glucose, UA: 100 mg/dL — AB
Hgb urine dipstick: NEGATIVE
Ketones, ur: NEGATIVE mg/dL
Leukocytes,Ua: NEGATIVE
Nitrite: NEGATIVE
Protein, ur: NEGATIVE mg/dL
Specific Gravity, Urine: >=1.03 (ref 1.005–1.030)
pH: 5.5 (ref 5.0–8.0)

## 2023-05-31 LAB — COMPREHENSIVE METABOLIC PANEL WITH GFR
ALT: 28 U/L (ref 0–44)
AST: 27 U/L (ref 15–41)
Albumin: 4.3 g/dL (ref 3.5–5.0)
Alkaline Phosphatase: 76 U/L (ref 38–126)
Anion gap: 12 (ref 5–15)
BUN: 20 mg/dL (ref 6–20)
CO2: 24 mmol/L (ref 22–32)
Calcium: 9.3 mg/dL (ref 8.9–10.3)
Chloride: 107 mmol/L (ref 98–111)
Creatinine, Ser: 1.09 mg/dL (ref 0.61–1.24)
GFR, Estimated: >60 mL/min (ref >60)
Glucose, Bld: 147 mg/dL — ABNORMAL HIGH (ref 70–99)
Potassium: 3.6 mmol/L (ref 3.5–5.1)
Sodium: 143 mmol/L (ref 135–145)
Total Bilirubin: 0.3 mg/dL (ref 0.0–1.2)
Total Protein: 7 g/dL (ref 6.5–8.1)

## 2023-05-31 LAB — D-DIMER, QUANTITATIVE: D-Dimer, Quant: 0.65 ug{FEU}/mL — ABNORMAL HIGH (ref 0.00–0.50)

## 2023-05-31 MED ORDER — MELOXICAM 7.5 MG PO TABS: 7.5000 mg | ORAL_TABLET | Freq: Every day | ORAL | 0 refills | Status: DC

## 2023-05-31 MED ORDER — LIDOCAINE 5 % EX PTCH: 1.0000 | MEDICATED_PATCH | CUTANEOUS | 0 refills | Status: DC

## 2023-05-31 MED ORDER — TIZANIDINE HCL 4 MG PO TABS: 4.0000 mg | ORAL_TABLET | Freq: Four times a day (QID) | ORAL | 0 refills | Status: DC | PRN

## 2023-05-31 MED ORDER — KETOROLAC TROMETHAMINE 30 MG/ML IJ SOLN
15.0000 mg | Freq: Once | INTRAMUSCULAR | Status: AC
  Administered 2023-05-31: 15 mg via INTRAVENOUS
  Filled 2023-05-31: qty 1

## 2023-05-31 MED ORDER — IOHEXOL 350 MG/ML SOLN
75.0000 mL | Freq: Once | INTRAVENOUS | Status: AC | PRN
  Administered 2023-05-31: 75 mL via INTRAVENOUS

## 2023-05-31 NOTE — ED Triage Notes (Signed)
 Mid right side back pain hurts to take a deep breath. Also right knee pain. X 1730 yesterday.

## 2023-05-31 NOTE — Discharge Instructions (Signed)
 You had a CT scan performed in the Emergency Department today that showed multiple incidental findings that will need to be followed up with your family doctor.  These include changes to your bladder, atherosclerosis, hernia and pulmonary nodule.

## 2023-05-31 NOTE — ED Provider Notes (Signed)
 [facility] Provider Note   CSN: [id number] Arrival date & time: 05/31/23  0414     History  Chief Complaint  Patient presents with   Back Pain    Randy Rose is a 58 y.o. male.  The history is provided by the patient.  Back Pain Randy Rose is a 58 y.o. male who presents to the Emergency Department complaining of back pain.  He presents to the emergency department for evaluation of sudden onset right sided mid back pain that is worse with breathing and moving.  Symptoms began at 5:30 PM.  No reported injuries.  No fever, cough, shortness of breath, chest pain, abdominal pain, dysuria, hematuria.  He does have mild nausea.  No numbness or weakness.  He also developed right knee pain in his knee Around the same time.  He does have a history of diabetes, hypertension, gout.  No history of DVT or PE. No prior abdominal surgeries.      Home Medications Prior to Admission medications   Medication Sig Start Date End Date Taking? Authorizing Provider  lidocaine  (LIDODERM ) 5 % Place 1 patch onto the skin daily. Remove & Discard patch within 12 hours or as directed by MD 05/31/23  Yes Kelsey Patricia, MD  meloxicam (MOBIC) 7.5 MG tablet Take 1 tablet (7.5 mg total) by mouth daily. 05/31/23  Yes Kelsey Patricia, MD  tiZANidine (ZANAFLEX) 4 MG tablet Take 1 tablet (4 mg total) by mouth every 6 (six) hours as needed for muscle spasms. 05/31/23  Yes Kelsey Patricia, MD  albuterol  (VENTOLIN  HFA) [phone] Base) MCG/ACT inhaler  06/09/19   [provider]  amLODipine  (NORVASC ) 10 MG tablet Take 1 tablet (10 mg total) by mouth daily. 08/17/19 09/16/19  Haydee Lipa, MD  amLODipine  (NORVASC ) 10 MG tablet 1 tab(s) orally once a day for 90 days 09/29/20   [provider]  amoxicillin -clavulanate (AUGMENTIN ) 875-125 MG tablet Take 1 tablet by mouth every 12 (twelve) hours. 10/18/22   Teddi Favors, DO  ARIPiprazole  (ABILIFY ) 5 MG  tablet Take 1 tablet by mouth once daily 11/13/21   Wray Heady, MD  colchicine  0.6 MG tablet Take 2 tablets by mouth and then a third tablet by mouth one hour later 06/28/21   Charmayne Cooper, MD  colchicine  0.6 MG tablet 1 tab(s) orally once a day    [provider]  diclofenac (VOLTAREN) 75 MG EC tablet 1 tab(s) orally 2 times a day for 30 days 02/15/17   [provider]  gabapentin (NEURONTIN) 100 MG capsule 2 cap(s) orally 3 times a day for 30 day(s) 07/05/21   [provider]  guaiFENesin -dextromethorphan (ROBITUSSIN DM) 100-10 MG/5ML syrup Take 5 mLs by mouth every 4 (four) hours as needed for cough. 10/18/22   Ninetta Basket, MD  hydrOXYzine  (VISTARIL ) 50 MG capsule Take 50 mg by mouth 2 (two) times daily as needed. Patient not taking: Reported on 07/12/2021 05/23/19   [provider]  ibuprofen  (ADVIL ) 600 MG tablet Take 1 tablet (600 mg total) by mouth 3 (three) times daily. Patient not taking: Reported on 07/12/2021 06/28/21   Charmayne Cooper, MD  lamoTRIgine  (LAMICTAL ) 100 MG tablet Take 1 tablet (100 mg total) by mouth daily. 10/20/21   Wray Heady, MD  lidocaine  (XYLOCAINE ) 2 % solution Use as directed 15 mLs in the mouth or throat as needed (sore throat). Gargle and spit 10/18/22   Russella Courts A, DO  lisinopril -hydrochlorothiazide  (  ZESTORETIC ) 20-12.5 MG tablet Take 2 tablets by mouth daily. 05/17/20   [provider]  metFORMIN  (GLUCOPHAGE ) 500 MG tablet Take 1 tablet (500 mg total) by mouth 2 (two) times daily with a meal. 02/12/23 03/14/23  Long, Shereen Dike, MD  naproxen  (NAPROSYN ) 500 MG tablet Take 1 tablet (500 mg total) by mouth 2 (two) times daily. 03/15/22   Albertus Hughs, DO  predniSONE  (STERAPRED UNI-PAK 21 TAB) 10 MG (21) TBPK tablet Take by mouth daily. Take 6 tabs by mouth daily  for 2 days, then 5 tabs for 2 days, then 4 tabs for 2 days, then 3 tabs for 2 days, 2 tabs for 2 days, then 1 tab by mouth daily for 2 days 10/18/22    Ninetta Basket, MD  valACYclovir  (VALTREX ) 1000 MG tablet Take 1,000 mg by mouth 2 (two) times daily. Patient not taking: Reported on 07/12/2021 06/09/19   [provider]  venlafaxine  (EFFEXOR ) 37.5 MG tablet TAKE ONE TABLET BY MOUTH TWICE DAILY. START WITH ONE TABLET DAILY FOR 5 DAYS, THEN INCREASE TO TWICE DAILY 11/21/21   Wray Heady, MD  vortioxetine  HBr (TRINTELLIX ) 10 MG TABS tablet Take 1 tablet (10 mg total) by mouth daily. 10/20/21   Wray Heady, MD      Allergies    Patient has no known allergies.    Review of Systems   Review of Systems  Musculoskeletal:  Positive for back pain.  All other systems reviewed and are negative.   Physical Exam Updated Vital Signs BP (!) 169/103   Pulse 93   Temp 98.5 F (36.9 C) (Oral)   Resp 20   Ht 6\' 1"  (1.854 m)   Wt 108.9 kg   SpO2 97%   BMI 31.66 kg/m  Physical Exam Vitals and nursing note reviewed.  Constitutional:      Appearance: He is well-developed.  HENT:     Head: Normocephalic and atraumatic.  Cardiovascular:     Rate and Rhythm: Normal rate and regular rhythm.     Heart sounds: No murmur heard. Pulmonary:     Effort: Pulmonary effort is normal. No respiratory distress.     Breath sounds: Normal breath sounds.  Abdominal:     Palpations: Abdomen is soft.     Tenderness: There is no abdominal tenderness. There is right CVA tenderness. There is no guarding or rebound.  Musculoskeletal:     Comments: There is mild tenderness to palpation over the right anterior knee without any effusion, erythema.  He does have mild pain on flexion of the knee but he is able to flex and extend the knee.  2+ DP pulses bilaterally.  No calf tenderness or swelling.  Skin:    General: Skin is warm and dry.  Neurological:     Mental Status: He is alert and oriented to person, place, and time.     Comments: 5 out of 5 strength in all 4 extremities with sensation to light touch intact in all 4 extremities  Psychiatric:         Behavior: Behavior normal.     ED Results / Procedures / Treatments   Labs (all labs ordered are listed, but only abnormal results are displayed) Labs Reviewed  COMPREHENSIVE METABOLIC PANEL WITH GFR - Abnormal; Notable for the following components:      Result Value   Glucose, Bld 147 (*)    All other components within normal limits  D-DIMER, QUANTITATIVE - Abnormal; Notable for the following components:  D-Dimer, Quant 0.65 (*)    All other components within normal limits  URINALYSIS, ROUTINE W REFLEX MICROSCOPIC - Abnormal; Notable for the following components:   Glucose, UA 100 (*)    All other components within normal limits  CBC WITH DIFFERENTIAL/PLATELET    EKG None  Radiology CT Angio Chest PE W/Cm &/Or Wo Cm Result Date: 05/31/2023 CLINICAL DATA:  Right-sided back pain.  Pain on deep inhalation. EXAM: CT ANGIOGRAPHY CHEST WITH CONTRAST TECHNIQUE: Multidetector CT imaging of the chest was performed using the standard protocol during bolus administration of intravenous contrast. Multiplanar CT image reconstructions and MIPs were obtained to evaluate the vascular anatomy. RADIATION DOSE REDUCTION: This exam was performed according to the departmental dose-optimization program which includes automated exposure control, adjustment of the mA and/or kV according to patient size and/or use of iterative reconstruction technique. CONTRAST:  75mL OMNIPAQUE  IOHEXOL  350 MG/ML SOLN COMPARISON:  04/21/2020 FINDINGS: Cardiovascular: Heart size upper normal. No substantial pericardial effusion. Mild atherosclerotic calcification is noted in the wall of the thoracic aorta. No large central pulmonary embolus. Imaging of the lower lungs is motion degraded but within this limitation no discernible pulmonary embolus in the lower lobes. Mediastinum/Nodes: No mediastinal lymphadenopathy. There is no hilar lymphadenopathy. The esophagus has normal imaging features. There is no axillary  lymphadenopathy. Lungs/Pleura: No focal airspace consolidation. No pulmonary edema or pleural effusion. 2 mm perifissural nodule noted right lower lobe on 64/304. 2 mm perifissural nodule noted right lung on 48/304. Upper Abdomen: See report for CT stone study performed at the same time and dictated separately. Musculoskeletal: No worrisome lytic or sclerotic osseous abnormality. Review of the MIP images confirms the above findings. IMPRESSION: 1. No CT evidence for acute pulmonary embolus. 2. No acute findings in the chest. 3. Tiny perifissural nodules in the right lung measuring 2 mm. No follow-up needed if patient is low-risk (and has no known or suspected primary neoplasm). Non-contrast chest CT can be considered in 12 months if patient is high-risk. This recommendation follows the consensus statement: Guidelines for Management of Incidental Pulmonary Nodules Detected on CT Images: From the Fleischner Society 2017; Radiology 2017; 284:228-243. 4.  Aortic Atherosclerosis (ICD10-I70.0). Electronically Signed   By: Donnal Fusi M.D.   On: 05/31/2023 06:29   CT Renal Stone Study Result Date: 05/31/2023 CLINICAL DATA:  Right-sided back pain. EXAM: CT ABDOMEN AND PELVIS WITHOUT CONTRAST TECHNIQUE: Multidetector CT imaging of the abdomen and pelvis was performed following the standard protocol without IV contrast. RADIATION DOSE REDUCTION: This exam was performed according to the departmental dose-optimization program which includes automated exposure control, adjustment of the mA and/or kV according to patient size and/or use of iterative reconstruction technique. COMPARISON:  None Available. FINDINGS: Lower chest: See report for CTA chest performed at the same time and dictated separately. Hepatobiliary: No suspicious focal abnormality in the liver on this study without intravenous contrast. Gallbladder decompressed. No intrahepatic or extrahepatic biliary dilation. Pancreas: No focal mass lesion. No dilatation  of the main duct. No intraparenchymal cyst. No peripancreatic edema. Spleen: No splenomegaly. No suspicious focal mass lesion. Adrenals/Urinary Tract: No adrenal nodule or mass. Kidneys unremarkable. No evidence for hydroureter. Atypical contour to anterior bladder with some potential tethering anteriorly. Stomach/Bowel: Stomach is unremarkable. No gastric wall thickening. No evidence of outlet obstruction. Duodenum is normally positioned as is the ligament of Treitz. No small bowel wall thickening. No small bowel dilatation. The terminal ileum is normal. The appendix is normal. No gross colonic mass. No colonic wall  thickening. Vascular/Lymphatic: There is mild atherosclerotic calcification of the abdominal aorta without aneurysm. There is no gastrohepatic or hepatoduodenal ligament lymphadenopathy. No retroperitoneal or mesenteric lymphadenopathy. No pelvic sidewall lymphadenopathy. Reproductive: The prostate gland and seminal vesicles are unremarkable. Other: No intraperitoneal free fluid. Musculoskeletal: Small left groin hernia contains only fat. Small to moderate umbilical hernia contains only fat. No worrisome lytic or sclerotic osseous abnormality. IMPRESSION: 1. No acute findings in the abdomen or pelvis. Specifically, no findings to explain the patient's history of right-sided back pain. 2. Atypical contour to anterior bladder with some potential tethering anteriorly. This is nonspecific, but urology follow-up with cystoscopy or cystography may prove helpful to exclude underlying mucosal/mural lesion. 3. Small left groin hernia contains only fat. 4. Small to moderate umbilical hernia contains only fat. 5.  Aortic Atherosclerosis (ICD10-I70.0). Electronically Signed   By: Donnal Fusi M.D.   On: 05/31/2023 06:23    Procedures Procedures    Medications Ordered in ED Medications  ketorolac  (TORADOL ) 30 MG/ML injection 15 mg (15 mg Intravenous Given 05/31/23 0450)  iohexol  (OMNIPAQUE ) 350 MG/ML  injection 75 mL (75 mLs Intravenous Contrast Given 05/31/23 0600)    ED Course/ Medical Decision Making/ A&P                                 Medical Decision Making Amount and/or Complexity of Data Reviewed Labs: ordered. Radiology: ordered.  Risk Prescription drug management.   Patient here for evaluation of right flank pain, pleuritic in nature and also worse with movement.  Given his symptoms a D-dimer was obtained, which was elevated.  A CTA PE study as well as a CT stone study were obtained, which are negative for PE, pneumonia, obstructing stone.  Discussed with patient multiple incidental findings including pulmonary nodule, atherosclerosis, umbilical and inguinal hernia as well as bladder changes.  Discussed he will require additional workup for this.  He has no neurologic deficits.  He is vascularly intact.  No overlying rash concerning for shingles.  UA is not consistent with UTI.  Presentation is not consistent with cholecystitis.  He does have partial improvement in his symptoms after medication in the emergency department.  Discussed with patient home care for flank pain, likely musculoskeletal in nature.  In terms of his knee pain, this also appears musculoskeletal, no evidence of septic or gouty arthritis.  Plan to discharge with medications for symptom management with planned outpatient follow-up and return precautions.        Final Clinical Impression(s) / ED Diagnoses Final diagnoses:  Right flank pain  Atherosclerosis  Pulmonary nodule    Rx / DC Orders ED Discharge Orders          Ordered    tiZANidine (ZANAFLEX) 4 MG tablet  Every 6 hours PRN        05/31/23 0640    meloxicam (MOBIC) 7.5 MG tablet  Daily        05/31/23 0640    lidocaine  (LIDODERM ) 5 %  Every 24 hours        05/31/23 0640              Kelsey Patricia, MD 05/31/23 [phone]

## 2023-06-20 DIAGNOSIS — Z8673 Personal history of transient ischemic attack (TIA), and cerebral infarction without residual deficits: Secondary | ICD-10-CM

## 2023-06-22 DIAGNOSIS — E538 Deficiency of other specified B group vitamins: Secondary | ICD-10-CM | POA: Insufficient documentation

## 2023-08-15 ENCOUNTER — Encounter: Payer: Self-pay | Admitting: Sports Medicine

## 2023-08-15 ENCOUNTER — Ambulatory Visit (INDEPENDENT_AMBULATORY_CARE_PROVIDER_SITE_OTHER): Admitting: Sports Medicine

## 2023-08-15 ENCOUNTER — Ambulatory Visit (HOSPITAL_BASED_OUTPATIENT_CLINIC_OR_DEPARTMENT_OTHER)
Admission: RE | Admit: 2023-08-15 | Discharge: 2023-08-15 | Disposition: A | Discharge status: Home / Self Care | Source: Ambulatory Visit | Attending: Sports Medicine | Admitting: Sports Medicine

## 2023-08-15 VITALS — BP 160/100 | Ht 73.0 in | Wt 254.0 lb

## 2023-08-15 DIAGNOSIS — G8929 Other chronic pain: Secondary | ICD-10-CM | POA: Diagnosis present

## 2023-08-15 DIAGNOSIS — M25561 Pain in right knee: Secondary | ICD-10-CM | POA: Diagnosis present

## 2023-08-15 DIAGNOSIS — M25562 Pain in left knee: Secondary | ICD-10-CM

## 2023-08-15 MED ORDER — METHYLPREDNISOLONE ACETATE 40 MG/ML IJ SUSP
40.0000 mg | Freq: Once | INTRAMUSCULAR | Status: AC
  Administered 2023-08-15: 40 mg via INTRA_ARTICULAR

## 2023-08-15 NOTE — Progress Notes (Signed)
   Subjective:    Patient ID: Randy Rose, male    DOB: 19-Apr-1965, 58 y.o.   MRN: [id number]  HPI chief complaint: Bilateral knee pain  Patient is a very pleasant 58 year old male that presents today with bilateral knee pain.  He has had pain in his knees before.  He has responded well to cortisone injections.  He believes he may have been told at one time that he has arthritis.  Pain is diffuse in both knees.  It makes it very difficult for him to get up from a seated position.  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  He does notice swelling from time to time.  He has tried over-the-counter pain medications and topical patches without much relief.  No prior knee surgeries.  No recent imaging.  Past medical history reviewed Medications reviewed Allergies reviewed  Review of Systems As above    Objective:   Physical Exam  Well-developed, well-nourished.  No acute distress  Examination of both knees show range of motion from 0 to 120 degrees.  No effusion.  He is tender to palpation along the medial joint line bilaterally.  Negative McMurray's.  Knees are grossly stable to ligamentous exam.  Neurovascularly intact distally.     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  This was accomplished atraumatically under sterile technique utilizing anterior lateral approach.  He tolerates this without difficulty.  I provided him with a note excusing him from work until August 3.  I would then like for him to avoid stairs at work if possible.  I have also provided him with a note for his apartment complex asking for a lower level unit without stairs.  I will get x-rays of both knees and we will follow-up with him via MyChart with those results when available.  He will follow-up in the office as needed.  Consent obtained and verified. Time-out  conducted. Noted no overlying erythema, induration, or other signs of local infection. Skin prepped in a sterile fashion. Topical analgesic spray: Ethyl chloride. Joint: Right knee Needle: 25-gauge 1.5 inch Completed without difficulty. Meds: 3 cc 1% Xylocaine , 1 cc (40 mg) Depo-Medrol   Consent obtained and verified. Time-out conducted. Noted no overlying erythema, induration, or other signs of local infection. Skin prepped in a sterile fashion. Topical analgesic spray: Ethyl chloride. Joint: Left knee Needle: 25-gauge 1.5 inch Completed without difficulty. Meds: 3 cc 1% Xylocaine , 1 cc (40 mg) Depo-Medrol   This note was dictated using Dragon naturally speaking software and may contain errors in syntax, spelling, or content which have not been identified prior to signing this note.

## 2023-09-24 ENCOUNTER — Encounter (HOSPITAL_BASED_OUTPATIENT_CLINIC_OR_DEPARTMENT_OTHER): Payer: Self-pay

## 2023-09-24 ENCOUNTER — Emergency Department (HOSPITAL_BASED_OUTPATIENT_CLINIC_OR_DEPARTMENT_OTHER)
Admission: EM | Admit: 2023-09-24 | Discharge: 2023-09-24 | Disposition: A | Discharge status: Home / Self Care | Attending: Emergency Medicine | Admitting: Emergency Medicine

## 2023-09-24 ENCOUNTER — Other Ambulatory Visit: Payer: Self-pay

## 2023-09-24 DIAGNOSIS — I10 Essential (primary) hypertension: Secondary | ICD-10-CM | POA: Diagnosis not present

## 2023-09-24 DIAGNOSIS — J029 Acute pharyngitis, unspecified: Secondary | ICD-10-CM | POA: Diagnosis not present

## 2023-09-24 DIAGNOSIS — R059 Cough, unspecified: Secondary | ICD-10-CM | POA: Diagnosis present

## 2023-09-24 DIAGNOSIS — J069 Acute upper respiratory infection, unspecified: Secondary | ICD-10-CM

## 2023-09-24 LAB — RESP PANEL BY RT-PCR (RSV, FLU A&B, COVID)  RVPGX2
Influenza A by PCR: NEGATIVE
Influenza B by PCR: NEGATIVE
Resp Syncytial Virus by PCR: NEGATIVE
SARS Coronavirus 2 by RT PCR: NEGATIVE

## 2023-09-24 MED ORDER — ONDANSETRON 4 MG PO TBDP
4.0000 mg | ORAL_TABLET | Freq: Once | ORAL | Status: AC
  Administered 2023-09-24: 4 mg via ORAL
  Filled 2023-09-24: qty 1

## 2023-09-24 NOTE — ED Triage Notes (Signed)
 Pt presents with complaints of pain with inhalation, body aches & chills X 2 days. Also endorses productive cough.

## 2023-09-24 NOTE — Discharge Instructions (Signed)
 We believe your persistent cough is a result of a viral syndrome for which your symptoms have still not quite resolved.  Sometimes it takes many weeks to completely go away, especially if you smoke or have chronic lung problems.  Please take any medications prescribed and follow up as recommended with your regular doctor.  If you develop any new or worsening symptoms, including but not limited to fever, persistent vomiting, worsening shortness of breath, or other symptoms that concern you, please return to the Emergency Department immediately.

## 2023-09-24 NOTE — ED Provider Notes (Signed)
 Emergency Department Provider Note   I have reviewed the triage vital signs and the nursing notes.   HISTORY  Chief Complaint Cough   HPI Randy Rose is a 58 y.o. male with PMH of HTN and HLD presents to the emergency department for evaluation of cough, congestion, body aches, severe fatigue.  Some mild nausea with associated diarrhea.  No focal abdominal pain or chest pain.  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  He has been vaccinated for COVID and received boosters.  He is concerned he may have contracted this or some other virus again.  Past Medical History:  Diagnosis Date   Depression    Herpes simplex type 2 infection    Hyperlipidemia    Hypertension     Review of Systems  Constitutional: No fever/chills ENT: Positive sore throat. Cardiovascular: Denies chest pain. Respiratory: Denies shortness of breath. Positive cough.  Gastrointestinal: No abdominal pain.   Skin: Negative for rash. Neurological: Positive HA.   ____________________________________________   PHYSICAL EXAM:  VITAL SIGNS: ED Triage Vitals  Encounter Vitals Group     BP 09/24/23 0842 (!) 166/106     Pulse Rate 09/24/23 0842 76     Resp 09/24/23 0842 18     Temp 09/24/23 0842 98.4 F (36.9 C)     Temp Source 09/24/23 0842 Oral     SpO2 09/24/23 0842 97 %   Constitutional: Alert and oriented. Well appearing and in no acute distress. Eyes: Conjunctivae are normal.  Head: Atraumatic. Nose: Positive congestion/rhinnorhea. Mouth/Throat: Mucous membranes are moist.   Neck: No stridor.   Cardiovascular: Normal rate, regular rhythm. Good peripheral circulation. Grossly normal heart sounds.   Respiratory: Normal respiratory effort.  No retractions. Lungs CTAB. Gastrointestinal: No distention.  Musculoskeletal: No gross deformities of extremities. Neurologic:  Normal speech and language.  Skin:  Skin is warm, dry and intact. No rash  noted.  ____________________________________________   LABS (all labs ordered are listed, but only abnormal results are displayed)  Labs Reviewed  RESP PANEL BY RT-PCR (RSV, FLU A&B, COVID)  [id number]   ____________________________________________  EKG  *** ____________________________________________  RADIOLOGY  No results found.  ____________________________________________   PROCEDURES  Procedure(s) performed:   Procedures   ____________________________________________   INITIAL IMPRESSION / ASSESSMENT AND PLAN / ED COURSE  Pertinent labs & imaging results that were available during my care of the patient were reviewed by me and considered in my medical decision making (see chart for details).   This patient is Presenting for Evaluation of cough, which does require a range of treatment options, and is a complaint that involves a high risk of morbidity and mortality.  The Differential Diagnoses includes URI, COVID, Flu, CAP, etc  Critical Interventions-    Medications  ondansetron  (ZOFRAN -ODT) disintegrating tablet 4 mg (has no administration in time range)    Reassessment after intervention:  symptoms improved.   Clinical Laboratory Tests Ordered, included ***  Radiologic Tests: Considered chest x-ray but lungs are clear with no hypoxemia.  Defer imaging for now.  Cardiac Monitor Tracing which shows NSR.    Social Determinants of Health Risk ***  Consult complete with  Medical Decision Making: Summary:  Patient presents emergency department with cough/congestion and bodyaches.  Mild nausea.  COVID swab pending.  Patient would be a candidate for Paxlovid if positive.  Normal kidney function and May of this year.   Reevaluation with update and discussion with   ***Considered  admission***  Patient's presentation is most consistent with acute, uncomplicated illness.   Disposition:   ____________________________________________  FINAL CLINICAL  IMPRESSION(S) / ED DIAGNOSES  Final diagnoses:  None     NEW OUTPATIENT MEDICATIONS STARTED DURING THIS VISIT:  New Prescriptions   No medications on file    Note:  This document was prepared using Dragon voice recognition software and may include unintentional dictation errors.  Fonda Law, MD, [facility]

## 2023-09-24 NOTE — ED Notes (Signed)
 Pt alert and oriented X 4 at the time of discharge. RR even and unlabored. No acute distress noted. Pt verbalized understanding of discharge instructions as discussed. Pt ambulatory to lobby at time of discharge.

## 2023-12-25 ENCOUNTER — Other Ambulatory Visit: Payer: Self-pay

## 2023-12-25 ENCOUNTER — Encounter (HOSPITAL_BASED_OUTPATIENT_CLINIC_OR_DEPARTMENT_OTHER): Payer: Self-pay

## 2023-12-25 ENCOUNTER — Emergency Department (HOSPITAL_BASED_OUTPATIENT_CLINIC_OR_DEPARTMENT_OTHER)
Admission: EM | Admit: 2023-12-25 | Discharge: 2023-12-25 | Disposition: A | Discharge status: Home / Self Care | Payer: MEDICAID | Attending: Emergency Medicine | Admitting: Emergency Medicine

## 2023-12-25 ENCOUNTER — Other Ambulatory Visit (HOSPITAL_BASED_OUTPATIENT_CLINIC_OR_DEPARTMENT_OTHER): Payer: Self-pay

## 2023-12-25 DIAGNOSIS — R739 Hyperglycemia, unspecified: Secondary | ICD-10-CM

## 2023-12-25 LAB — CBG MONITORING, ED
Glucose-Capillary: 261 mg/dL — ABNORMAL HIGH (ref 70–99)
Glucose-Capillary: 386 mg/dL — ABNORMAL HIGH (ref 70–99)
Glucose-Capillary: 437 mg/dL — ABNORMAL HIGH (ref 70–99)

## 2023-12-25 LAB — URINALYSIS, ROUTINE W REFLEX MICROSCOPIC
Bilirubin Urine: NEGATIVE
Glucose, UA: >=500 mg/dL — AB
Ketones, ur: NEGATIVE mg/dL
Leukocytes,Ua: NEGATIVE
Nitrite: NEGATIVE
Protein, ur: NEGATIVE mg/dL
Specific Gravity, Urine: <=1.005 (ref 1.005–1.030)
pH: 5.5 (ref 5.0–8.0)

## 2023-12-25 LAB — BASIC METABOLIC PANEL WITH GFR
Anion gap: 12 (ref 5–15)
BUN: 13 mg/dL (ref 6–20)
CO2: 27 mmol/L (ref 22–32)
Calcium: 9.6 mg/dL (ref 8.9–10.3)
Chloride: 99 mmol/L (ref 98–111)
Creatinine, Ser: 1.12 mg/dL (ref 0.61–1.24)
GFR, Estimated: >60 mL/min (ref >60)
Glucose, Bld: 448 mg/dL — ABNORMAL HIGH (ref 70–99)
Potassium: 3.5 mmol/L (ref 3.5–5.1)
Sodium: 139 mmol/L (ref 135–145)

## 2023-12-25 LAB — URINALYSIS, MICROSCOPIC (REFLEX): Bacteria, UA: NONE SEEN

## 2023-12-25 LAB — CBC
HCT: 47.5 % (ref 39.0–52.0)
Hemoglobin: 15.8 g/dL (ref 13.0–17.0)
MCH: 28.2 pg (ref 26.0–34.0)
MCHC: 33.3 g/dL (ref 30.0–36.0)
MCV: 84.8 fL (ref 80.0–100.0)
Platelets: 238 K/uL (ref 150–400)
RBC: 5.6 MIL/uL (ref 4.22–5.81)
RDW: 12.9 % (ref 11.5–15.5)
WBC: 7.1 K/uL (ref 4.0–10.5)
nRBC: 0 % (ref 0.0–0.2)

## 2023-12-25 MED ORDER — SODIUM CHLORIDE 0.9 % IV BOLUS
1000.0000 mL | Freq: Once | INTRAVENOUS | Status: AC
  Administered 2023-12-25: 1000 mL via INTRAVENOUS

## 2023-12-25 MED ORDER — INSULIN ASPART 100 UNIT/ML IJ SOLN
5.0000 [IU] | Freq: Once | INTRAMUSCULAR | Status: AC
  Administered 2023-12-25: 5 [IU] via INTRAVENOUS
  Filled 2023-12-25: qty 5

## 2023-12-25 MED ORDER — METFORMIN HCL 500 MG PO TABS
500.0000 mg | ORAL_TABLET | Freq: Two times a day (BID) | ORAL | 0 refills | Status: DC
  Filled 2023-12-25 (×2): qty 60, 30d supply, fill #0

## 2023-12-25 MED ORDER — INSULIN ASPART 100 UNIT/ML IJ SOLN
8.0000 [IU] | Freq: Once | INTRAMUSCULAR | Status: AC
  Administered 2023-12-25: 8 [IU] via SUBCUTANEOUS
  Filled 2023-12-25: qty 8

## 2023-12-25 NOTE — ED Triage Notes (Signed)
 Increased urination, thirst, blurred vision for 2 days. Pt reports he used to be on metformin , was taken off by PCP. Pt states he knows the sx of high blood sugar so he came in. A&Ox4 on arrival.

## 2023-12-25 NOTE — ED Provider Notes (Signed)
 [place] [facility] Provider Note   CSN: [id number] Arrival date & time: 12/25/23  0009     Patient presents with: Hyperglycemia   Randy Rose is a 58 y.o. male.   Patient is a 58 year old male with history of type 2 diabetes, hypertension.  Patient presenting today with complaints of increased thirst, increased urination, and blurry vision.  The symptoms have been worsening over the past few days.  He tells me that his primary doctor took him off of his metformin  in September.  He has not checked his blood sugars at home, but feels as though they are high.  He denies any other symptoms.       Prior to Admission medications   Medication Sig Start Date End Date Taking? Authorizing Provider  albuterol  (VENTOLIN  HFA) 108 (90 Base) MCG/ACT inhaler  06/09/19   [provider]  amLODipine  (NORVASC ) 10 MG tablet Take 1 tablet (10 mg total) by mouth daily. 08/17/19 09/16/19  Lue Elsie BROCKS, MD  amLODipine  (NORVASC ) 10 MG tablet 1 tab(s) orally once a day for 90 days 09/29/20   [provider]  amoxicillin -clavulanate (AUGMENTIN ) 875-125 MG tablet Take 1 tablet by mouth every 12 (twelve) hours. 10/18/22   Elnor Jayson LABOR, DO  ARIPiprazole  (ABILIFY ) 5 MG tablet Take 1 tablet by mouth once daily 11/13/21   Geralene Kaiser, MD  colchicine  0.6 MG tablet Take 2 tablets by mouth and then a third tablet by mouth one hour later 06/28/21   Roselyn Carlin NOVAK, MD  colchicine  0.6 MG tablet 1 tab(s) orally once a day    [provider]  diclofenac (VOLTAREN) 75 MG EC tablet 1 tab(s) orally 2 times a day for 30 days 02/15/17   [provider]  gabapentin (NEURONTIN) 100 MG capsule 2 cap(s) orally 3 times a day for 30 day(s) 07/05/21   [provider]  guaiFENesin -dextromethorphan (ROBITUSSIN DM) 100-10 MG/5ML syrup Take 5 mLs by mouth every 4 (four) hours as needed for cough. 10/18/22   Yolande Lamar BROCKS, MD  hydrOXYzine   (VISTARIL ) 50 MG capsule Take 50 mg by mouth 2 (two) times daily as needed. Patient not taking: Reported on 07/12/2021 05/23/19   [provider]  ibuprofen  (ADVIL ) 600 MG tablet Take 1 tablet (600 mg total) by mouth 3 (three) times daily. Patient not taking: Reported on 07/12/2021 06/28/21   Roselyn Carlin NOVAK, MD  lamoTRIgine  (LAMICTAL ) 100 MG tablet Take 1 tablet (100 mg total) by mouth daily. 10/20/21   Geralene Kaiser, MD  lidocaine  (LIDODERM ) 5 % Place 1 patch onto the skin daily. Remove & Discard patch within 12 hours or as directed by MD 05/31/23   Griselda Norris, MD  lidocaine  (XYLOCAINE ) 2 % solution Use as directed 15 mLs in the mouth or throat as needed (sore throat). Gargle and spit 10/18/22   Elnor Jayson LABOR, DO  lisinopril -hydrochlorothiazide  (ZESTORETIC ) 20-12.5 MG tablet Take 2 tablets by mouth daily. 05/17/20   [provider]  meloxicam  (MOBIC ) 7.5 MG tablet Take 1 tablet (7.5 mg total) by mouth daily. 05/31/23   Griselda Norris, MD  metFORMIN  (GLUCOPHAGE ) 500 MG tablet Take 1 tablet (500 mg total) by mouth 2 (two) times daily with a meal. 02/12/23 03/14/23  Long, Fonda MATSU, MD  naproxen  (NAPROSYN ) 500 MG tablet Take 1 tablet (500 mg total) by mouth 2 (two) times daily. 03/15/22   Emil Share, DO  predniSONE  (STERAPRED UNI-PAK 21 TAB) 10 MG (21) TBPK tablet Take by mouth  daily. Take 6 tabs by mouth daily  for 2 days, then 5 tabs for 2 days, then 4 tabs for 2 days, then 3 tabs for 2 days, 2 tabs for 2 days, then 1 tab by mouth daily for 2 days 10/18/22   Yolande Lamar BROCKS, MD  tiZANidine  (ZANAFLEX ) 4 MG tablet Take 1 tablet (4 mg total) by mouth every 6 (six) hours as needed for muscle spasms. 05/31/23   Griselda Norris, MD  valACYclovir  (VALTREX ) 1000 MG tablet Take 1,000 mg by mouth 2 (two) times daily. Patient not taking: Reported on 07/12/2021 06/09/19   [provider]  venlafaxine  (EFFEXOR ) 37.5 MG tablet TAKE ONE TABLET BY MOUTH TWICE DAILY. START WITH ONE TABLET DAILY  FOR 5 DAYS, THEN INCREASE TO TWICE DAILY 11/21/21   Geralene Kaiser, MD  vortioxetine  HBr (TRINTELLIX ) 10 MG TABS tablet Take 1 tablet (10 mg total) by mouth daily. 10/20/21   Geralene Kaiser, MD    Allergies: Patient has no known allergies.    Review of Systems  All other systems reviewed and are negative.   Updated Vital Signs BP (!) 164/113   Pulse (!) 110   Temp 97.9 F (36.6 C) (Oral)   Resp 18   Ht 6' 1 (1.854 m)   Wt 120.2 kg   SpO2 97%   BMI 34.96 kg/m   Physical Exam Vitals and nursing note reviewed.  Constitutional:      General: He is not in acute distress.    Appearance: He is well-developed. He is not diaphoretic.  HENT:     Head: Normocephalic and atraumatic.  Cardiovascular:     Rate and Rhythm: Normal rate and regular rhythm.     Heart sounds: No murmur heard.    No friction rub.  Pulmonary:     Effort: Pulmonary effort is normal. No respiratory distress.     Breath sounds: Normal breath sounds. No wheezing or rales.  Abdominal:     General: Bowel sounds are normal. There is no distension.     Palpations: Abdomen is soft.     Tenderness: There is no abdominal tenderness.  Musculoskeletal:        General: Normal range of motion.     Cervical back: Normal range of motion and neck supple.  Skin:    General: Skin is warm and dry.  Neurological:     Mental Status: He is alert and oriented to person, place, and time.     Coordination: Coordination normal.     (all labs ordered are listed, but only abnormal results are displayed) Labs Reviewed  CBG MONITORING, ED - Abnormal; Notable for the following components:      Result Value   Glucose-Capillary 437 (*)    All other components within normal limits  CBC  BASIC METABOLIC PANEL WITH GFR  URINALYSIS, ROUTINE W REFLEX MICROSCOPIC  CBG MONITORING, ED    EKG: None  Radiology: No results found.   Procedures   Medications Ordered in the ED  sodium chloride  0.9 % bolus 1,000 mL (has no  administration in time range)  insulin  regular (NOVOLIN R) 100 units/mL injection 8 Units (has no administration in time range)                                    Medical Decision Making Amount and/or Complexity of Data Reviewed Labs: ordered.  Risk OTC drugs. Prescription drug management.  Patient is a 59 year old male presenting with elevated blood sugar.  Patient arrives here with stable vital signs and is afebrile.  Physical examination basically unremarkable.  Laboratory studies obtained including CBC, metabolic panel, and urinalysis.  Sugar is 448 with no evidence for DKA.  CBC unremarkable.  Urinalysis showing only glucose.  Patient was given IV fluids along with NovoLog  and his blood sugar is now 261.  Patient to be discharged.  I will represcribe his metformin  and have him follow-up with primary doctor.     Final diagnoses:  None    ED Discharge Orders     None          Geroldine Berg, MD 12/25/23 [phone]

## 2023-12-25 NOTE — Discharge Instructions (Addendum)
 Begin taking metformin  as prescribed.  Follow-up with primary doctor for a recheck of your blood sugar.  Return to the emergency department for any new and/or concerning issues.

## 2024-01-06 ENCOUNTER — Observation Stay (HOSPITAL_COMMUNITY): Payer: MEDICAID

## 2024-01-06 ENCOUNTER — Observation Stay (HOSPITAL_BASED_OUTPATIENT_CLINIC_OR_DEPARTMENT_OTHER)
Admission: EM | Admit: 2024-01-06 | Discharge: 2024-01-07 | Disposition: A | Discharge status: Home / Self Care | Payer: MEDICAID | Attending: Internal Medicine | Admitting: Internal Medicine

## 2024-01-06 ENCOUNTER — Emergency Department (HOSPITAL_BASED_OUTPATIENT_CLINIC_OR_DEPARTMENT_OTHER): Payer: MEDICAID

## 2024-01-06 ENCOUNTER — Encounter (HOSPITAL_BASED_OUTPATIENT_CLINIC_OR_DEPARTMENT_OTHER): Payer: Self-pay

## 2024-01-06 ENCOUNTER — Other Ambulatory Visit: Payer: Self-pay

## 2024-01-06 DIAGNOSIS — I10 Essential (primary) hypertension: Secondary | ICD-10-CM | POA: Diagnosis not present

## 2024-01-06 DIAGNOSIS — Z794 Long term (current) use of insulin: Secondary | ICD-10-CM | POA: Diagnosis not present

## 2024-01-06 DIAGNOSIS — F32A Depression, unspecified: Secondary | ICD-10-CM | POA: Insufficient documentation

## 2024-01-06 DIAGNOSIS — J4520 Mild intermittent asthma, uncomplicated: Secondary | ICD-10-CM | POA: Diagnosis not present

## 2024-01-06 DIAGNOSIS — F419 Anxiety disorder, unspecified: Secondary | ICD-10-CM | POA: Diagnosis not present

## 2024-01-06 DIAGNOSIS — Z79899 Other long term (current) drug therapy: Secondary | ICD-10-CM | POA: Insufficient documentation

## 2024-01-06 DIAGNOSIS — E119 Type 2 diabetes mellitus without complications: Secondary | ICD-10-CM

## 2024-01-06 DIAGNOSIS — M1A9XX0 Chronic gout, unspecified, without tophus (tophi): Secondary | ICD-10-CM | POA: Insufficient documentation

## 2024-01-06 DIAGNOSIS — E1110 Type 2 diabetes mellitus with ketoacidosis without coma: Principal | ICD-10-CM | POA: Insufficient documentation

## 2024-01-06 DIAGNOSIS — R110 Nausea: Secondary | ICD-10-CM | POA: Diagnosis not present

## 2024-01-06 DIAGNOSIS — Z743 Need for continuous supervision: Secondary | ICD-10-CM | POA: Diagnosis not present

## 2024-01-06 DIAGNOSIS — R739 Hyperglycemia, unspecified: Secondary | ICD-10-CM | POA: Diagnosis present

## 2024-01-06 DIAGNOSIS — Z8673 Personal history of transient ischemic attack (TIA), and cerebral infarction without residual deficits: Secondary | ICD-10-CM | POA: Insufficient documentation

## 2024-01-06 DIAGNOSIS — F418 Other specified anxiety disorders: Secondary | ICD-10-CM | POA: Diagnosis present

## 2024-01-06 DIAGNOSIS — M5459 Other low back pain: Secondary | ICD-10-CM | POA: Diagnosis not present

## 2024-01-06 LAB — BASIC METABOLIC PANEL WITH GFR
Anion gap: 22 — ABNORMAL HIGH (ref 5–15)
Anion gap: 12 (ref 5–15)
Anion gap: 8 (ref 5–15)
BUN: 14 mg/dL (ref 6–20)
BUN: 11 mg/dL (ref 6–20)
BUN: 11 mg/dL (ref 6–20)
CO2: 18 mmol/L — ABNORMAL LOW (ref 22–32)
CO2: 24 mmol/L (ref 22–32)
CO2: 28 mmol/L (ref 22–32)
Calcium: 9.7 mg/dL (ref 8.9–10.3)
Calcium: 9.5 mg/dL (ref 8.9–10.3)
Calcium: 9.5 mg/dL (ref 8.9–10.3)
Chloride: 96 mmol/L — ABNORMAL LOW (ref 98–111)
Chloride: 106 mmol/L (ref 98–111)
Chloride: 107 mmol/L (ref 98–111)
Creatinine, Ser: 1.16 mg/dL (ref 0.61–1.24)
Creatinine, Ser: 0.95 mg/dL (ref 0.61–1.24)
Creatinine, Ser: 0.86 mg/dL (ref 0.61–1.24)
GFR, Estimated: >60 mL/min
GFR, Estimated: >60 mL/min
GFR, Estimated: >60 mL/min
Glucose, Bld: 505 mg/dL (ref 70–99)
Glucose, Bld: 182 mg/dL — ABNORMAL HIGH (ref 70–99)
Glucose, Bld: 159 mg/dL — ABNORMAL HIGH (ref 70–99)
Potassium: 4.5 mmol/L (ref 3.5–5.1)
Potassium: 4.2 mmol/L (ref 3.5–5.1)
Potassium: 4.2 mmol/L (ref 3.5–5.1)
Sodium: 136 mmol/L (ref 135–145)
Sodium: 142 mmol/L (ref 135–145)
Sodium: 143 mmol/L (ref 135–145)

## 2024-01-06 LAB — GLUCOSE, CAPILLARY
Glucose-Capillary: 155 mg/dL — ABNORMAL HIGH (ref 70–99)
Glucose-Capillary: 162 mg/dL — ABNORMAL HIGH (ref 70–99)
Glucose-Capillary: 164 mg/dL — ABNORMAL HIGH (ref 70–99)
Glucose-Capillary: 169 mg/dL — ABNORMAL HIGH (ref 70–99)
Glucose-Capillary: 181 mg/dL — ABNORMAL HIGH (ref 70–99)
Glucose-Capillary: 253 mg/dL — ABNORMAL HIGH (ref 70–99)

## 2024-01-06 LAB — I-STAT VENOUS BLOOD GAS, ED
Acid-base deficit: 1 mmol/L (ref 0.0–2.0)
Bicarbonate: 24.2 mmol/L (ref 20.0–28.0)
Calcium, Ion: 1.14 mmol/L — ABNORMAL LOW (ref 1.15–1.40)
HCT: 52 % (ref 39.0–52.0)
Hemoglobin: 17.7 g/dL — ABNORMAL HIGH (ref 13.0–17.0)
O2 Saturation: 80 %
Patient temperature: 98.1
Potassium: 4.2 mmol/L (ref 3.5–5.1)
Sodium: 136 mmol/L (ref 135–145)
TCO2: 25 mmol/L (ref 22–32)
pCO2, Ven: 40.9 mmHg — ABNORMAL LOW (ref 44–60)
pH, Ven: 7.38 (ref 7.25–7.43)
pO2, Ven: 44 mmHg (ref 32–45)

## 2024-01-06 LAB — CBC
HCT: 49.3 % (ref 39.0–52.0)
Hemoglobin: 16.8 g/dL (ref 13.0–17.0)
MCH: 28.2 pg (ref 26.0–34.0)
MCHC: 34.1 g/dL (ref 30.0–36.0)
MCV: 82.9 fL (ref 80.0–100.0)
Platelets: 222 K/uL (ref 150–400)
RBC: 5.95 MIL/uL — ABNORMAL HIGH (ref 4.22–5.81)
RDW: 12.5 % (ref 11.5–15.5)
WBC: 5.4 K/uL (ref 4.0–10.5)
nRBC: 0 % (ref 0.0–0.2)

## 2024-01-06 LAB — URINALYSIS, ROUTINE W REFLEX MICROSCOPIC
Bilirubin Urine: NEGATIVE
Glucose, UA: >=500 mg/dL — AB
Ketones, ur: 40 mg/dL — AB
Leukocytes,Ua: NEGATIVE
Nitrite: NEGATIVE
Protein, ur: NEGATIVE mg/dL
Specific Gravity, Urine: 1.01 (ref 1.005–1.030)
pH: 5 (ref 5.0–8.0)

## 2024-01-06 LAB — HEMOGLOBIN A1C
Hgb A1c MFr Bld: 10.7 % — ABNORMAL HIGH (ref 4.8–5.6)
Mean Plasma Glucose: 260.39 mg/dL

## 2024-01-06 LAB — BETA-HYDROXYBUTYRIC ACID
Beta-Hydroxybutyric Acid: 2.21 mmol/L — ABNORMAL HIGH (ref 0.05–0.27)
Beta-Hydroxybutyric Acid: 0.43 mmol/L — ABNORMAL HIGH (ref 0.05–0.27)
Beta-Hydroxybutyric Acid: 0.19 mmol/L (ref 0.05–0.27)

## 2024-01-06 LAB — CBG MONITORING, ED
Glucose-Capillary: 545 mg/dL (ref 70–99)
Glucose-Capillary: 379 mg/dL — ABNORMAL HIGH (ref 70–99)
Glucose-Capillary: 292 mg/dL — ABNORMAL HIGH (ref 70–99)
Glucose-Capillary: 200 mg/dL — ABNORMAL HIGH (ref 70–99)
Glucose-Capillary: 189 mg/dL — ABNORMAL HIGH (ref 70–99)

## 2024-01-06 LAB — URINALYSIS, MICROSCOPIC (REFLEX)

## 2024-01-06 LAB — MRSA NEXT GEN BY PCR, NASAL: MRSA by PCR Next Gen: NOT DETECTED

## 2024-01-06 LAB — GROUP A STREP BY PCR: Group A Strep by PCR: NOT DETECTED

## 2024-01-06 LAB — HIV ANTIBODY (ROUTINE TESTING W REFLEX): HIV Screen 4th Generation wRfx: NONREACTIVE

## 2024-01-06 MED ORDER — LACTATED RINGERS IV SOLN: INTRAVENOUS | Status: DC

## 2024-01-06 MED ORDER — ACETAMINOPHEN 325 MG PO TABS
650.0000 mg | ORAL_TABLET | Freq: Four times a day (QID) | ORAL | Status: DC | PRN
  Administered 2024-01-06: 650 mg via ORAL
  Filled 2024-01-06: qty 2

## 2024-01-06 MED ORDER — DEXTROSE IN LACTATED RINGERS 5 % IV SOLN: INTRAVENOUS | Status: DC

## 2024-01-06 MED ORDER — ONDANSETRON HCL 4 MG/2ML IJ SOLN
4.0000 mg | Freq: Once | INTRAMUSCULAR | Status: AC
  Administered 2024-01-06: 4 mg via INTRAVENOUS
  Filled 2024-01-06: qty 2

## 2024-01-06 MED ORDER — HYDROCHLOROTHIAZIDE 12.5 MG PO TABS
12.5000 mg | ORAL_TABLET | Freq: Two times a day (BID) | ORAL | Status: DC
  Administered 2024-01-07: 12.5 mg via ORAL
  Filled 2024-01-06: qty 1

## 2024-01-06 MED ORDER — ENOXAPARIN SODIUM 40 MG/0.4ML IJ SOSY
40.0000 mg | PREFILLED_SYRINGE | INTRAMUSCULAR | Status: DC
  Administered 2024-01-06: 40 mg via SUBCUTANEOUS
  Filled 2024-01-06: qty 0.4

## 2024-01-06 MED ORDER — POTASSIUM CHLORIDE 10 MEQ/100ML IV SOLN
10.0000 meq | INTRAVENOUS | Status: AC
  Administered 2024-01-06 (×2): 10 meq via INTRAVENOUS
  Filled 2024-01-06 (×2): qty 100

## 2024-01-06 MED ORDER — LISINOPRIL 10 MG PO TABS
20.0000 mg | ORAL_TABLET | Freq: Two times a day (BID) | ORAL | Status: DC
  Administered 2024-01-07: 20 mg via ORAL
  Filled 2024-01-06: qty 2

## 2024-01-06 MED ORDER — METOPROLOL TARTRATE 5 MG/5ML IV SOLN: 5.0000 mg | Freq: Three times a day (TID) | INTRAVENOUS | Status: DC | PRN

## 2024-01-06 MED ORDER — LISINOPRIL 10 MG PO TABS: 10.0000 mg | ORAL_TABLET | Freq: Two times a day (BID) | ORAL | Status: DC

## 2024-01-06 MED ORDER — ORAL CARE MOUTH RINSE: 15.0000 mL | OROMUCOSAL | Status: DC | PRN

## 2024-01-06 MED ORDER — AMLODIPINE BESYLATE 10 MG PO TABS
10.0000 mg | ORAL_TABLET | Freq: Every day | ORAL | Status: DC
  Administered 2024-01-06 – 2024-01-07 (×2): 10 mg via ORAL
  Filled 2024-01-06 (×2): qty 1

## 2024-01-06 MED ORDER — CHLORHEXIDINE GLUCONATE CLOTH 2 % EX PADS
6.0000 | MEDICATED_PAD | Freq: Every day | CUTANEOUS | Status: DC
  Administered 2024-01-06: 6 via TOPICAL

## 2024-01-06 MED ORDER — LISINOPRIL-HYDROCHLOROTHIAZIDE 20-12.5 MG PO TABS: 1.0000 | ORAL_TABLET | Freq: Two times a day (BID) | ORAL | Status: DC

## 2024-01-06 MED ORDER — HYDROCHLOROTHIAZIDE 12.5 MG PO TABS: 12.5000 mg | ORAL_TABLET | Freq: Two times a day (BID) | ORAL | Status: DC

## 2024-01-06 MED ORDER — ONDANSETRON HCL 4 MG/2ML IJ SOLN: 4.0000 mg | Freq: Four times a day (QID) | INTRAMUSCULAR | Status: DC | PRN

## 2024-01-06 MED ORDER — SODIUM CHLORIDE 0.9 % IV BOLUS
1000.0000 mL | Freq: Once | INTRAVENOUS | Status: AC
  Administered 2024-01-06: 1000 mL via INTRAVENOUS

## 2024-01-06 MED ORDER — LACTATED RINGERS IV BOLUS
20.0000 mL/kg | Freq: Once | INTRAVENOUS | Status: AC
  Administered 2024-01-06: 2404 mL via INTRAVENOUS

## 2024-01-06 MED ORDER — DEXTROSE 50 % IV SOLN: 0.0000 mL | INTRAVENOUS | Status: AC | PRN

## 2024-01-06 MED ORDER — IOHEXOL 300 MG/ML  SOLN
100.0000 mL | Freq: Once | INTRAMUSCULAR | Status: AC | PRN
  Administered 2024-01-06: 100 mL via INTRAVENOUS

## 2024-01-06 MED ORDER — INSULIN REGULAR(HUMAN) IN NACL 100-0.9 UT/100ML-% IV SOLN
INTRAVENOUS | Status: DC
  Administered 2024-01-06: 15 [IU]/h via INTRAVENOUS
  Filled 2024-01-06: qty 100

## 2024-01-06 NOTE — ED Provider Notes (Signed)
 " [place] [facility] Provider Note   CSN: [id number] Arrival date & time: 01/06/24  1059     Patient presents with: Hyperglycemia   Randy Rose is a 58 y.o. male.   Patient is a 58 year old male with a history of diabetes who presents with nausea and feeling bad.  He states that he was taken off his metformin  back into February by his PCP.  He said his sugars have been cut up and down since then but have been higher over the last month or so.  He started feeling worse over the last 2 or 3 weeks.  He was seen here in the ED on December 9 for similar symptoms.  He has had nausea and no appetite.  He says he can hardly eat or drink anything.  He said increased thirst and increased urination.  He was started back on his metformin  at that time.  The patient says he has not really been feeling any better.  He also says he does not tolerate the metformin  very well as it makes him feel even worse with increased nausea.  He had cut back to 1 tablet he but recently he has started back on 2 tablets a day.  He also has a sore throat and is concerned that he has thrush.  He feels lightheaded.  He has some abdominal discomfort.  No fevers.  No cough or cold symptoms.  He cannot get back into see his PCP until February.       Prior to Admission medications  Medication Sig Start Date End Date Taking? Authorizing Provider  albuterol  (VENTOLIN  HFA) 108 (90 Base) MCG/ACT inhaler  06/09/19   [provider]  amLODipine  (NORVASC ) 10 MG tablet Take 1 tablet (10 mg total) by mouth daily. 08/17/19 09/16/19  Lue Elsie BROCKS, MD  amLODipine  (NORVASC ) 10 MG tablet 1 tab(s) orally once a day for 90 days 09/29/20   [provider]  amoxicillin -clavulanate (AUGMENTIN ) 875-125 MG tablet Take 1 tablet by mouth every 12 (twelve) hours. 10/18/22   Elnor Jayson LABOR, DO  ARIPiprazole  (ABILIFY ) 5 MG tablet Take 1 tablet by mouth once daily 11/13/21   Geralene Kaiser, MD   colchicine  0.6 MG tablet Take 2 tablets by mouth and then a third tablet by mouth one hour later 06/28/21   Roselyn Carlin NOVAK, MD  colchicine  0.6 MG tablet 1 tab(s) orally once a day    [provider]  diclofenac (VOLTAREN) 75 MG EC tablet 1 tab(s) orally 2 times a day for 30 days 02/15/17   [provider]  gabapentin (NEURONTIN) 100 MG capsule 2 cap(s) orally 3 times a day for 30 day(s) 07/05/21   [provider]  guaiFENesin -dextromethorphan (ROBITUSSIN DM) 100-10 MG/5ML syrup Take 5 mLs by mouth every 4 (four) hours as needed for cough. 10/18/22   Yolande Lamar BROCKS, MD  hydrOXYzine  (VISTARIL ) 50 MG capsule Take 50 mg by mouth 2 (two) times daily as needed. Patient not taking: Reported on 07/12/2021 05/23/19   [provider]  ibuprofen  (ADVIL ) 600 MG tablet Take 1 tablet (600 mg total) by mouth 3 (three) times daily. Patient not taking: Reported on 07/12/2021 06/28/21   Roselyn Carlin NOVAK, MD  lamoTRIgine  (LAMICTAL ) 100 MG tablet Take 1 tablet (100 mg total) by mouth daily. 10/20/21   Geralene Kaiser, MD  lidocaine  (LIDODERM ) 5 % Place 1 patch onto the skin daily. Remove & Discard patch within 12 hours or as directed by MD  05/31/23   Griselda Norris, MD  lidocaine  (XYLOCAINE ) 2 % solution Use as directed 15 mLs in the mouth or throat as needed (sore throat). Gargle and spit 10/18/22   Elnor Savant A, DO  lisinopril -hydrochlorothiazide  (ZESTORETIC ) 20-12.5 MG tablet Take 2 tablets by mouth daily. 05/17/20   [provider]  meloxicam  (MOBIC ) 7.5 MG tablet Take 1 tablet (7.5 mg total) by mouth daily. 05/31/23   Griselda Norris, MD  metFORMIN  (GLUCOPHAGE ) 500 MG tablet Take 1 tablet (500 mg total) by mouth 2 (two) times daily with a meal. 12/25/23 01/24/24  Geroldine Berg, MD  naproxen  (NAPROSYN ) 500 MG tablet Take 1 tablet (500 mg total) by mouth 2 (two) times daily. 03/15/22   Emil Share, DO  predniSONE  (STERAPRED UNI-PAK 21 TAB) 10 MG (21) TBPK tablet Take by mouth  daily. Take 6 tabs by mouth daily  for 2 days, then 5 tabs for 2 days, then 4 tabs for 2 days, then 3 tabs for 2 days, 2 tabs for 2 days, then 1 tab by mouth daily for 2 days 10/18/22   Yolande Lamar BROCKS, MD  tiZANidine  (ZANAFLEX ) 4 MG tablet Take 1 tablet (4 mg total) by mouth every 6 (six) hours as needed for muscle spasms. 05/31/23   Griselda Norris, MD  valACYclovir  (VALTREX ) 1000 MG tablet Take 1,000 mg by mouth 2 (two) times daily. Patient not taking: Reported on 07/12/2021 06/09/19   [provider]  venlafaxine  (EFFEXOR ) 37.5 MG tablet TAKE ONE TABLET BY MOUTH TWICE DAILY. START WITH ONE TABLET DAILY FOR 5 DAYS, THEN INCREASE TO TWICE DAILY 11/21/21   Geralene Kaiser, MD  vortioxetine  HBr (TRINTELLIX ) 10 MG TABS tablet Take 1 tablet (10 mg total) by mouth daily. 10/20/21   Geralene Kaiser, MD    Allergies: Patient has no known allergies.    Review of Systems  Constitutional:  Positive for fatigue. Negative for chills, diaphoresis and fever.  HENT:  Positive for sore throat. Negative for congestion, rhinorrhea and sneezing.   Eyes: Negative.   Respiratory:  Negative for cough, chest tightness and shortness of breath.   Cardiovascular:  Negative for chest pain and leg swelling.  Gastrointestinal:  Positive for abdominal pain and nausea. Negative for diarrhea and vomiting.  Genitourinary:  Negative for difficulty urinating, flank pain, frequency and hematuria.  Musculoskeletal:  Negative for arthralgias and back pain.  Skin:  Negative for rash.  Neurological:  Negative for dizziness, speech difficulty, weakness, numbness and headaches.    Updated Vital Signs BP 123/77   Pulse 87   Temp 98.1 F (36.7 C) (Oral)   Resp 17   SpO2 100%   Physical Exam Constitutional:      Appearance: He is well-developed. He is ill-appearing.  HENT:     Head: Normocephalic and atraumatic.     Mouth/Throat:     Comments: White coating on the tongue, erythema to the posterior pharynx, no  peritonsillar fullness, uvula is midline, no trismus, no elevation of the tongue Eyes:     Pupils: Pupils are equal, round, and reactive to light.  Cardiovascular:     Rate and Rhythm: Regular rhythm. Tachycardia present.     Heart sounds: Normal heart sounds.  Pulmonary:     Effort: Pulmonary effort is normal. No respiratory distress.     Breath sounds: Normal breath sounds. No wheezing or rales.  Chest:     Chest wall: No tenderness.  Abdominal:     General: Bowel sounds are normal.  Palpations: Abdomen is soft.     Tenderness: There is no abdominal tenderness. There is no guarding or rebound.  Musculoskeletal:        General: Normal range of motion.     Cervical back: Normal range of motion and neck supple.  Lymphadenopathy:     Cervical: No cervical adenopathy.  Skin:    General: Skin is warm and dry.     Findings: No rash.  Neurological:     Mental Status: He is alert and oriented to person, place, and time.     (all labs ordered are listed, but only abnormal results are displayed) Labs Reviewed  BASIC METABOLIC PANEL WITH GFR - Abnormal; Notable for the following components:      Result Value   Chloride 96 (*)    CO2 18 (*)    Glucose, Bld 505 (*)    Anion gap 22 (*)    All other components within normal limits  CBC - Abnormal; Notable for the following components:   RBC 5.95 (*)    All other components within normal limits  URINALYSIS, ROUTINE W REFLEX MICROSCOPIC - Abnormal; Notable for the following components:   Glucose, UA >=500 (*)    Hgb urine dipstick TRACE (*)    Ketones, ur 40 (*)    All other components within normal limits  URINALYSIS, MICROSCOPIC (REFLEX) - Abnormal; Notable for the following components:   Bacteria, UA RARE (*)    All other components within normal limits  CBG MONITORING, ED - Abnormal; Notable for the following components:   Glucose-Capillary 545 (*)    All other components within normal limits  CBG MONITORING, ED - Abnormal;  Notable for the following components:   Glucose-Capillary 379 (*)    All other components within normal limits  I-STAT VENOUS BLOOD GAS, ED - Abnormal; Notable for the following components:   pCO2, Ven 40.9 (*)    Calcium, Ion 1.14 (*)    Hemoglobin 17.7 (*)    All other components within normal limits  GROUP A STREP BY PCR  BASIC METABOLIC PANEL WITH GFR  BASIC METABOLIC PANEL WITH GFR  BASIC METABOLIC PANEL WITH GFR  BETA-HYDROXYBUTYRIC ACID  BETA-HYDROXYBUTYRIC ACID  BETA-HYDROXYBUTYRIC ACID  BETA-HYDROXYBUTYRIC ACID  CBG MONITORING, ED  CBG MONITORING, ED    EKG: EKG Interpretation Date/Time:  Sunday January 06 2024 12:11:33 EST Ventricular Rate:  96 PR Interval:  147 QRS Duration:  91 QT Interval:  350 QTC Calculation: 443 R Axis:   48  Text Interpretation: Sinus rhythm RSR' in V1 or V2, right VCD or RVH Borderline ST elevation, anterior leads SINCE LAST TRACING HEART RATE HAS INCREASED Confirmed by Lenor Hollering [phone]) on 01/06/2024 12:29:36 PM  Radiology: CT ABDOMEN PELVIS W CONTRAST Result Date: 01/06/2024 EXAM: CT ABDOMEN AND PELVIS WITH CONTRAST 01/06/2024 01:36:56 PM TECHNIQUE: CT of the abdomen and pelvis was performed with the administration of 100 mL of iohexol  (OMNIPAQUE ) 300 MG/ML solution. Multiplanar reformatted images are provided for review. Automated exposure control, iterative reconstruction, and/or weight-based adjustment of the mA/kV was utilized to reduce the radiation dose to as low as reasonably achievable. COMPARISON: CT performed 05/31/2023. CLINICAL HISTORY: Abdominal pain, acute, nonlocalized. FINDINGS: LOWER CHEST: Likely bibasilar subsegmental atelectasis. LIVER: Hepatic steatosis. GALLBLADDER AND BILE DUCTS: Gallbladder is unremarkable. No biliary ductal dilatation. SPLEEN: No acute abnormality. PANCREAS: No acute abnormality. ADRENAL GLANDS: No acute abnormality. KIDNEYS, URETERS AND BLADDER: Minimally hyperdense 2.1 cm left lower pole cystic  renal lesion, favor to represent proteinaceous  or hemorrhagic cyst. This is unchanged from the prior study and minimally increased in size since CT performed in 04/2016. Additional subcentimeter renal hypodensities, too small to characterize. No stones in the kidneys or ureters. No hydronephrosis. No perinephric or periureteral stranding. The bladder is decompressed, limiting evaluation. Unchanged anterior bladder wall contour, possibly related to tethering. GI AND BOWEL: Stomach demonstrates no acute abnormality. There is no bowel obstruction. PERITONEUM AND RETROPERITONEUM: No ascites. No free air. VASCULATURE: Aorta is normal in caliber. LYMPH NODES: No lymphadenopathy. REPRODUCTIVE ORGANS: No acute abnormality. BONES AND SOFT TISSUES: Fat-containing umbilical and left inguinal hernias. No acute osseous abnormality. No focal soft tissue abnormality. IMPRESSION: 1. No acute findings in the abdomen or pelvis. 2. Stable chronic and incidental findings as noted above. Electronically signed by: Michaeline Blanch MD 01/06/2024 01:50 PM EST RP Workstation: [id number]     Procedures   Medications Ordered in the ED  insulin  regular, human (MYXREDLIN ) 100 units/ 100 mL infusion (15 Units/hr Intravenous New Bag/Given 01/06/24 1430)  lactated ringers  infusion (has no administration in time range)  dextrose  5 % in lactated ringers  infusion (has no administration in time range)  dextrose  50 % solution 0-50 mL (has no administration in time range)  potassium chloride  10 mEq in 100 mL IVPB (10 mEq Intravenous New Bag/Given 01/06/24 1433)  sodium chloride  0.9 % bolus 1,000 mL (0 mLs Intravenous Stopped 01/06/24 1400)  ondansetron  (ZOFRAN ) injection 4 mg (4 mg Intravenous Given 01/06/24 1244)  iohexol  (OMNIPAQUE ) 300 MG/ML solution 100 mL (100 mLs Intravenous Contrast Given 01/06/24 1337)  lactated ringers  bolus 2,404 mL (2,404 mLs Intravenous New Bag/Given 01/06/24 1404)                                    Medical  Decision Making Amount and/or Complexity of Data Reviewed Labs: ordered. Radiology: ordered.  Risk Prescription drug management. Decision regarding hospitalization.   This patient presents to the ED for concern of nausea, increased thirst, this involves an extensive number of treatment options, and is a complaint that carries with it a high risk of complications and morbidity.  I considered the following differential and admission for this acute, potentially life threatening condition.  The differential diagnosis includes hyperglycemia, DKA, infection, UTI, oral thrush, strep pharyngitis, abdominal infection, obstruction  MDM:    Patient is a 58 year old who presents with elevated blood sugars in conjunction with increased thirst, nausea and decreased appetite.  His blood glucose is markedly elevated here in the ED.  He also has acidosis and an anion gap concerning for DKA.  His pH is normal.  Will start him on IV fluids and IV insulin  per drip.  He was started on glucose stabilizer protocol.  He had a CT scan of the abdomen pelvis which did not show any acute abnormalities.  Will plan admission.  He was markedly tachycardic on arrival but this has resolved with IV fluids.  Dr. Elnor to take call from hospitalist.  CRITICAL CARE Performed by: Andrea Ness Total critical care time: 70 minutes Critical care time was exclusive of separately billable procedures and treating other patients. Critical care was necessary to treat or prevent imminent or life-threatening deterioration. Critical care was time spent personally by me on the following activities: development of treatment plan with patient and/or surrogate as well as nursing, discussions with consultants, evaluation of patient's response to treatment, examination of patient, obtaining history from patient or surrogate,  ordering and performing treatments and interventions, ordering and review of laboratory studies, ordering and review of  radiographic studies, pulse oximetry and re-evaluation of patient's condition.   (Labs, imaging, consults)  Labs: I Ordered, and personally interpreted labs.  The pertinent results include: Hyperglycemia, elevated anion gap, low bicarb, normal pH  Imaging Studies ordered: I ordered imaging studies including CT abdomen pelvis I independently visualized and interpreted imaging. I agree with the radiologist interpretation  Additional history obtained from  .  External records from outside source obtained and reviewed including prior notes  Cardiac Monitoring: The patient was maintained on a cardiac monitor.  If on the cardiac monitor, I personally viewed and interpreted the cardiac monitored which showed an underlying rhythm of: Sinus tachycardia  Reevaluation: After the interventions noted above, I reevaluated the patient and found that they have :improved  Social Determinants of Health:    Disposition: Admit to hospital  Co morbidities that complicate the patient evaluation  Past Medical History:  Diagnosis Date   Depression    Herpes simplex type 2 infection    Hyperlipidemia    Hypertension      Medicines Meds ordered this encounter  Medications   sodium chloride  0.9 % bolus 1,000 mL   ondansetron  (ZOFRAN ) injection 4 mg   iohexol  (OMNIPAQUE ) 300 MG/ML solution 100 mL   lactated ringers  bolus 2,404 mL   insulin  regular, human (MYXREDLIN ) 100 units/ 100 mL infusion    EndoTool Goal Range::   140-180    Type of Diabetes:   Type 2    Mode of Therapy:   ENDOX1 for DKA    Start Method:   EndoTool to calculate   lactated ringers  infusion   dextrose  5 % in lactated ringers  infusion   dextrose  50 % solution 0-50 mL   potassium chloride  10 mEq in 100 mL IVPB    I have reviewed the patients home medicines and have made adjustments as needed  Problem List / ED Course: Problem List Items Addressed This Visit   None Visit Diagnoses       Diabetic ketoacidosis without  coma associated with type 2 diabetes mellitus (HCC)    -  Primary   Relevant Medications   insulin  regular, human (MYXREDLIN ) 100 units/ 100 mL infusion                Final diagnoses:  Diabetic ketoacidosis without coma associated with type 2 diabetes mellitus [facility])    ED Discharge Orders     None          Lenor Hollering, MD 01/06/24 1515  "

## 2024-01-06 NOTE — ED Triage Notes (Signed)
 Reports increased thirst, increased urinary frequency, oral thrush for 2 days. BIL flank pain, lightheaded  Also complains of sore throat since yesterday, states thinks its from dry mouth  Pt is diabetic

## 2024-01-06 NOTE — Assessment & Plan Note (Addendum)
 On lipitor 20mg , no ASA

## 2024-01-06 NOTE — ED Notes (Signed)
 [facility] called for transport.

## 2024-01-06 NOTE — Assessment & Plan Note (Addendum)
 No longer on any medication due to insurance changes Unsure what medication he was on Has been off medication x 6 months

## 2024-01-06 NOTE — H&P (Signed)
 " History and Physical    Patient: Randy Rose FMW:[id number] DOB: 1965-03-17 DOA: 01/06/2024 DOS: the patient was seen and examined on 01/06/2024 PCP: Katrinka Duwaine LABOR, FNP  Patient coming from: [facility] - lives alone. Ambulates independently    Chief Complaint: feeling bad   HPI: [name] is a 58 y.o. male with medical history significant of  T2DM, HTN, hx of CVA, depression and anxiety, mild asthma, gout who presented to ED with complaints of feeling bad. He was taken off metformin  in February by his PCP and his sugars have been fluctuating. He started to feel worse over the past 2-3 weeks. He was seen in ED on 12/9 and started back on metformin . He has increased thirst and urination. He cut back to 1 pill/day of the metformin  to due GI SE. He states he really can't tolerate the metformin  at all. He states between Friday and Saturday he was drinking so much water and was urinating every 12 minutes. He then got to the point where he couldn't eat. He states last night he was feeling bad and urinated during the night which wasn't normal and prompted him to come to ED. He denies any fever/chills. He denies any recent illness. He had some blurred vision as well. He has a headache which he has had since yesterday. He had some nausea with the metformin  and some with eating, but no vomiting.    He has been feeling good. Denies any fever/chills, chest pain or palpitations, shortness of breath or cough, abdominal pain, diarrhea,  dysuria or leg swelling.    He does not smoke or drink alcohol.   ER Course:  Vitals: afebrile, bp: 153/95, HR: 121, RR: 16, oxygen: 93%RA Pertinent labs: CBG: 545, UA: 40 ketones, CO2:18, AG: 22, pH: 7.380,  CT abdomen/pelvis: no acute findings In ED: started on insulin  gtt and DKA protocol. Given 20mg /kg IVF, potassium. [facility] asked to admit.    Review of Systems: As mentioned in the history of present illness. All other systems reviewed and are negative. Past  Medical History:  Diagnosis Date   Depression    Herpes simplex type 2 infection    Hyperlipidemia    Hypertension    History reviewed. No pertinent surgical history. Social History:  reports that he has never smoked. He has never used smokeless tobacco. He reports that he does not drink alcohol and does not use drugs.  Allergies[1]  Family History  Problem Relation Age of Onset   Hypertension Mother    Hyperlipidemia Mother    Hyperlipidemia Father    Hypertension Father     Prior to Admission medications  Medication Sig Start Date End Date Taking? Authorizing Provider  albuterol  (VENTOLIN  HFA) 108 (90 Base) MCG/ACT inhaler  06/09/19   [provider]  amLODipine  (NORVASC ) 10 MG tablet Take 1 tablet (10 mg total) by mouth daily. 08/17/19 09/16/19  Lue Elsie BROCKS, MD  amLODipine  (NORVASC ) 10 MG tablet 1 tab(s) orally once a day for 90 days 09/29/20   [provider]  amoxicillin -clavulanate (AUGMENTIN ) 875-125 MG tablet Take 1 tablet by mouth every 12 (twelve) hours. 10/18/22   Elnor Jayson LABOR, DO  ARIPiprazole  (ABILIFY ) 5 MG tablet Take 1 tablet by mouth once daily 11/13/21   Geralene Kaiser, MD  colchicine  0.6 MG tablet Take 2 tablets by mouth and then a third tablet by mouth one hour later 06/28/21   Roselyn Carlin NOVAK, MD  colchicine  0.6 MG tablet 1 tab(s) orally once a day  [provider]  diclofenac (VOLTAREN) 75 MG EC tablet 1 tab(s) orally 2 times a day for 30 days 02/15/17   [provider]  gabapentin (NEURONTIN) 100 MG capsule 2 cap(s) orally 3 times a day for 30 day(s) 07/05/21   [provider]  guaiFENesin -dextromethorphan (ROBITUSSIN DM) 100-10 MG/5ML syrup Take 5 mLs by mouth every 4 (four) hours as needed for cough. 10/18/22   Yolande Lamar BROCKS, MD  hydrOXYzine  (VISTARIL ) 50 MG capsule Take 50 mg by mouth 2 (two) times daily as needed. Patient not taking: Reported on 07/12/2021 05/23/19   [provider]  ibuprofen   (ADVIL ) 600 MG tablet Take 1 tablet (600 mg total) by mouth 3 (three) times daily. Patient not taking: Reported on 07/12/2021 06/28/21   Roselyn Carlin NOVAK, MD  lamoTRIgine  (LAMICTAL ) 100 MG tablet Take 1 tablet (100 mg total) by mouth daily. 10/20/21   Geralene Kaiser, MD  lidocaine  (LIDODERM ) 5 % Place 1 patch onto the skin daily. Remove & Discard patch within 12 hours or as directed by MD 05/31/23   Griselda Norris, MD  lidocaine  (XYLOCAINE ) 2 % solution Use as directed 15 mLs in the mouth or throat as needed (sore throat). Gargle and spit 10/18/22   Elnor Savant A, DO  lisinopril -hydrochlorothiazide  (ZESTORETIC ) 20-12.5 MG tablet Take 2 tablets by mouth daily. 05/17/20   [provider]  meloxicam  (MOBIC ) 7.5 MG tablet Take 1 tablet (7.5 mg total) by mouth daily. 05/31/23   Griselda Norris, MD  metFORMIN  (GLUCOPHAGE ) 500 MG tablet Take 1 tablet (500 mg total) by mouth 2 (two) times daily with a meal. 12/25/23 01/24/24  Geroldine Berg, MD  naproxen  (NAPROSYN ) 500 MG tablet Take 1 tablet (500 mg total) by mouth 2 (two) times daily. 03/15/22   Emil Share, DO  predniSONE  (STERAPRED UNI-PAK 21 TAB) 10 MG (21) TBPK tablet Take by mouth daily. Take 6 tabs by mouth daily  for 2 days, then 5 tabs for 2 days, then 4 tabs for 2 days, then 3 tabs for 2 days, 2 tabs for 2 days, then 1 tab by mouth daily for 2 days 10/18/22   Yolande Lamar BROCKS, MD  tiZANidine  (ZANAFLEX ) 4 MG tablet Take 1 tablet (4 mg total) by mouth every 6 (six) hours as needed for muscle spasms. 05/31/23   Griselda Norris, MD  valACYclovir  (VALTREX ) 1000 MG tablet Take 1,000 mg by mouth 2 (two) times daily. Patient not taking: Reported on 07/12/2021 06/09/19   [provider]  venlafaxine  (EFFEXOR ) 37.5 MG tablet TAKE ONE TABLET BY MOUTH TWICE DAILY. START WITH ONE TABLET DAILY FOR 5 DAYS, THEN INCREASE TO TWICE DAILY 11/21/21   Geralene Kaiser, MD  vortioxetine  HBr (TRINTELLIX ) 10 MG TABS tablet Take 1 tablet (10 mg total) by mouth daily.  10/20/21   Geralene Kaiser, MD    Physical Exam: Vitals:   01/06/24 1345 01/06/24 1430 01/06/24 1625 01/06/24 1844  BP: 135/80 123/77  (!) 165/81  Pulse: 86 87  77  Resp: 16 17  18   Temp:   99 F (37.2 C) 97.9 F (36.6 C)  TempSrc:   Oral Oral  SpO2: 92% 100%  99%  Weight:    111.1 kg  Height:    6' 1 (1.854 m)   General:  Appears calm and comfortable and is in NAD Eyes:  PERRL, EOMI, normal lids, iris ENT:  grossly normal hearing, lips & tongue, dry mucous membranes; appropriate dentition Neck:  no LAD, masses or thyromegaly; no carotid bruits  Cardiovascular:  RRR, no m/r/g. No LE edema.  Respiratory:   CTA bilaterally with no wheezes/rales/rhonchi.  Normal respiratory effort. Abdomen:  soft, NT, ND, NABS Back:   normal alignment, no CVAT Skin:  no rash or induration seen on limited exam Musculoskeletal:  grossly normal tone BUE/BLE, good ROM, no bony abnormality Lower extremity:  No LE edema.  Limited foot exam with no ulcerations.  2+ distal pulses. Psychiatric:  grossly normal mood and affect, speech fluent and appropriate, AOx3 Neurologic:  CN 2-12 grossly intact, moves all extremities in coordinated fashion, sensation intact   Radiological Exams on Admission: Independently reviewed - see discussion in A/P where applicable  Portable chest x-ray (1 view) Result Date: 01/06/2024 CLINICAL DATA:  DKA EXAM: PORTABLE CHEST 1 VIEW COMPARISON:  10/18/2022 FINDINGS: The heart size and mediastinal contours are within normal limits. Both lungs are clear. The visualized skeletal structures are unremarkable. IMPRESSION: No active disease. Electronically Signed   By: Oneil Devonshire M.D.   On: 01/06/2024 20:56   CT ABDOMEN PELVIS W CONTRAST Result Date: 01/06/2024 EXAM: CT ABDOMEN AND PELVIS WITH CONTRAST 01/06/2024 01:36:56 PM TECHNIQUE: CT of the abdomen and pelvis was performed with the administration of 100 mL of iohexol  (OMNIPAQUE ) 300 MG/ML solution. Multiplanar reformatted images  are provided for review. Automated exposure control, iterative reconstruction, and/or weight-based adjustment of the mA/kV was utilized to reduce the radiation dose to as low as reasonably achievable. COMPARISON: CT performed 05/31/2023. CLINICAL HISTORY: Abdominal pain, acute, nonlocalized. FINDINGS: LOWER CHEST: Likely bibasilar subsegmental atelectasis. LIVER: Hepatic steatosis. GALLBLADDER AND BILE DUCTS: Gallbladder is unremarkable. No biliary ductal dilatation. SPLEEN: No acute abnormality. PANCREAS: No acute abnormality. ADRENAL GLANDS: No acute abnormality. KIDNEYS, URETERS AND BLADDER: Minimally hyperdense 2.1 cm left lower pole cystic renal lesion, favor to represent proteinaceous or hemorrhagic cyst. This is unchanged from the prior study and minimally increased in size since CT performed in 04/2016. Additional subcentimeter renal hypodensities, too small to characterize. No stones in the kidneys or ureters. No hydronephrosis. No perinephric or periureteral stranding. The bladder is decompressed, limiting evaluation. Unchanged anterior bladder wall contour, possibly related to tethering. GI AND BOWEL: Stomach demonstrates no acute abnormality. There is no bowel obstruction. PERITONEUM AND RETROPERITONEUM: No ascites. No free air. VASCULATURE: Aorta is normal in caliber. LYMPH NODES: No lymphadenopathy. REPRODUCTIVE ORGANS: No acute abnormality. BONES AND SOFT TISSUES: Fat-containing umbilical and left inguinal hernias. No acute osseous abnormality. No focal soft tissue abnormality. IMPRESSION: 1. No acute findings in the abdomen or pelvis. 2. Stable chronic and incidental findings as noted above. Electronically signed by: Michaeline Blanch MD 01/06/2024 01:50 PM EST RP Workstation: [id number]    EKG: Independently reviewed.  NSR with rate 96; nonspecific ST changes with no evidence of acute ischemia   Labs on Admission: I have personally reviewed the available labs and imaging studies at the time of the  admission.  Pertinent labs:   CBG: 545,  UA: 40 ketones,  CO2:18,  AG: 22,  pH: 7.380,   Assessment and Plan: Principal Problem:   DKA, type 2 (HCC) Active Problems:   Essential hypertension   History of CVA (cerebrovascular accident)   Chronic gout involving toe of right foot without tophus   Mild intermittent asthma   Mixed anxiety and depressive disorder    Assessment and Plan: * DKA, type 42 (HCC) 58 year old presenting to ED with complaints of polyuria, polydipsia and just not feeling well found to have a blood sugar of >500, increased anion gap  and low co2 c/w DKA  -obs to stepdown  -A1C in June was 6.5. has been unable to tolerate metformin   -denies any recent illness  -Otherwise no apparent reason for DKA -early to mild DKA on admission based on normal pH, bicarb of  18, anion gap >12, + beta-hydroxybutyrate, patient alert  -DKA protocol initiated in ED  -K+ supplementation per protocol  -received appropriate bolus  -IVF at 125cc/hr, LR until glucose <250 and then change to D5LR -continue IV insulin  until gap closes, discussed will be on overnight. Okay with calorie free liquids  -transition to [place] insulin  in the AM  -diabetic educator -A1C pending    Essential hypertension On lisinopril  40mg , hydrochlorothiazide  25mg  and norvasc  10mg   PRN IV medication with parameters   History of CVA (cerebrovascular accident) On lipitor 20mg , no ASA  Chronic gout involving toe of right foot without tophus No medication   Mild intermittent asthma No signs of exacerbation  Continue SABA prn   Mixed anxiety and depressive disorder No longer on any medication due to insurance changes Unsure what medication he was on Has been off medication x 6 months     Advance Care Planning:   Code Status: Full Code    Consultants: Diabetic educator    Procedures: None    Antibiotics: None     DVT Prophylaxis: lovenox    Family Communication: none   Severity of  Illness: The appropriate patient status for this patient is OBSERVATION. Observation status is judged to be reasonable and necessary in order to provide the required intensity of service to ensure the patient's safety. The patient's presenting symptoms, physical exam findings, and initial radiographic and laboratory data in the context of their medical condition is felt to place them at decreased risk for further clinical deterioration. Furthermore, it is anticipated that the patient will be medically stable for discharge from the hospital within 2 midnights of admission.   Author: Isaiah Geralds, MD 01/06/2024 10:47 PM  For on call review [email or web address].      [1]  Allergies Allergen Reactions   Metformin  And Related Diarrhea, Itching, Nausea Only and Other (See Comments)    Headaches, too   "

## 2024-01-06 NOTE — Assessment & Plan Note (Addendum)
 On lisinopril  40mg , hydrochlorothiazide  25mg  and norvasc  10mg   PRN IV medication with parameters

## 2024-01-06 NOTE — Assessment & Plan Note (Signed)
No signs of exacerbation  Continue SABA prn  

## 2024-01-06 NOTE — Assessment & Plan Note (Signed)
No medication

## 2024-01-06 NOTE — ED Notes (Signed)
 Attempted IV access x 2 to left hand, tol well but unsuccessful. Blood obtained for labs

## 2024-01-06 NOTE — Assessment & Plan Note (Signed)
 58 year old presenting to ED with complaints of polyuria, polydipsia and just not feeling well found to have a blood sugar of >500, increased anion gap and low co2 c/w DKA  -obs to stepdown  -A1C in June was 6.5. has been unable to tolerate metformin   -denies any recent illness  -Otherwise no apparent reason for DKA -early to mild DKA on admission based on normal pH, bicarb of  18, anion gap >12, + beta-hydroxybutyrate, patient alert  -DKA protocol initiated in ED  -K+ supplementation per protocol  -received appropriate bolus  -IVF at 125cc/hr, LR until glucose <250 and then change to D5LR -continue IV insulin  until gap closes, discussed will be on overnight. Okay with calorie free liquids  -transition to [place] insulin  in the AM  -diabetic educator -A1C pending

## 2024-01-06 NOTE — ED Provider Notes (Signed)
" °  Provider Note MRN:  [id number]  Arrival date & time: 01/06/2024    ED Course and Medical Decision Making  Assumed care from Dr Lenor at shift change.  See note from prior team for complete details, in brief:  58 yo/m  DM Here w/ hyperglycemia Mild DKA on labs Endo tool started IVF resus Plan admit per prior team Spoke w/ [facility], accept pt for admission    .Critical Care  Performed by: Elnor Jayson LABOR, DO Authorized by: Elnor Jayson LABOR, DO   Critical care provider statement:    Critical care time (minutes):  30   Critical care time was exclusive of:  Separately billable procedures and treating other patients   Critical care was necessary to treat or prevent imminent or life-threatening deterioration of the following conditions:  Endocrine crisis   Critical care was time spent personally by me on the following activities:  Development of treatment plan with patient or surrogate, discussions with consultants, evaluation of patient's response to treatment, ordering and performing treatments and interventions, pulse oximetry, re-evaluation of patient's condition and review of old charts   Care discussed with: admitting provider     Final Clinical Impressions(s) / ED Diagnoses     ICD-10-CM   1. Diabetic ketoacidosis without coma associated with type 2 diabetes mellitus [facility])  E11.10       ED Discharge Orders     None       Discharge Instructions   None        Elnor Jayson LABOR, DO 01/06/24 1702  "

## 2024-01-06 NOTE — Progress Notes (Signed)
 Plan of Care Note for accepted transfer   Patient: Randy Rose MRN: [id number]   DOA: 01/06/2024  Facility requesting transfer: [facility] Requesting Provider: Dr. Elnor  Reason for transfer: DKA Facility course: 58 year old with medical history significant for T2DM, HTN, hx of CVA, depression and anxiety, mild asthma, gout who presented to ED with complaints of feeling bad. He was taken off metformin  in February by his PCP and his sugars have been fluctuating. He started to feel worse over the past 2-3 weeks. He was seen in ED on 12/9 and started back on metformin . He has increased thirst and urination. He cut back to 1 pill/day of the metformin  to due GI SE.   Vitals: afebrile, bp: 153/95, HR: 121, RR: 16, oxygen: 93%RA Pertinent labs: CBG: 545, UA: 40 ketones, CO2:18, AG: 22, pH: 7.380,  CT abdomen/pelvis: no acute findings In ED: started on insulin  gtt and DKA protocol. Given 20mg /kg IVF, potassium. [facility] asked to admit.   Plan of care: The patient is accepted for admission to [facility] unit, at [facility].    Author: Isaiah Geralds, MD 01/06/2024  Check [email or web address] for on-call coverage.  Nursing staff, Please call [facility] Admits & Consults System-Wide number on Amion as soon as patient's arrival, so appropriate admitting provider can evaluate the pt.

## 2024-01-07 ENCOUNTER — Other Ambulatory Visit (HOSPITAL_COMMUNITY): Payer: Self-pay

## 2024-01-07 ENCOUNTER — Other Ambulatory Visit: Payer: Self-pay

## 2024-01-07 DIAGNOSIS — E1110 Type 2 diabetes mellitus with ketoacidosis without coma: Secondary | ICD-10-CM | POA: Diagnosis not present

## 2024-01-07 DIAGNOSIS — I10 Essential (primary) hypertension: Secondary | ICD-10-CM | POA: Diagnosis not present

## 2024-01-07 LAB — BASIC METABOLIC PANEL WITH GFR
Anion gap: 11 (ref 5–15)
Anion gap: 10 (ref 5–15)
Anion gap: 10 (ref 5–15)
Anion gap: 10 (ref 5–15)
BUN: 11 mg/dL (ref 6–20)
BUN: 11 mg/dL (ref 6–20)
BUN: 12 mg/dL (ref 6–20)
BUN: 11 mg/dL (ref 6–20)
CO2: 24 mmol/L (ref 22–32)
CO2: 26 mmol/L (ref 22–32)
CO2: 26 mmol/L (ref 22–32)
CO2: 26 mmol/L (ref 22–32)
Calcium: 9.2 mg/dL (ref 8.9–10.3)
Calcium: 8.9 mg/dL (ref 8.9–10.3)
Calcium: 9.2 mg/dL (ref 8.9–10.3)
Calcium: 9.5 mg/dL (ref 8.9–10.3)
Chloride: 105 mmol/L (ref 98–111)
Chloride: 104 mmol/L (ref 98–111)
Chloride: 105 mmol/L (ref 98–111)
Chloride: 101 mmol/L (ref 98–111)
Creatinine, Ser: 0.83 mg/dL (ref 0.61–1.24)
Creatinine, Ser: 0.87 mg/dL (ref 0.61–1.24)
Creatinine, Ser: 0.91 mg/dL (ref 0.61–1.24)
Creatinine, Ser: 0.82 mg/dL (ref 0.61–1.24)
GFR, Estimated: >60 mL/min
GFR, Estimated: >60 mL/min
GFR, Estimated: >60 mL/min
GFR, Estimated: >60 mL/min
Glucose, Bld: 184 mg/dL — ABNORMAL HIGH (ref 70–99)
Glucose, Bld: 247 mg/dL — ABNORMAL HIGH (ref 70–99)
Glucose, Bld: 293 mg/dL — ABNORMAL HIGH (ref 70–99)
Glucose, Bld: 270 mg/dL — ABNORMAL HIGH (ref 70–99)
Potassium: 3.2 mmol/L — ABNORMAL LOW (ref 3.5–5.1)
Potassium: 3.5 mmol/L (ref 3.5–5.1)
Potassium: 4.7 mmol/L (ref 3.5–5.1)
Potassium: 4.1 mmol/L (ref 3.5–5.1)
Sodium: 140 mmol/L (ref 135–145)
Sodium: 140 mmol/L (ref 135–145)
Sodium: 140 mmol/L (ref 135–145)
Sodium: 138 mmol/L (ref 135–145)

## 2024-01-07 LAB — GLUCOSE, CAPILLARY
Glucose-Capillary: 188 mg/dL — ABNORMAL HIGH (ref 70–99)
Glucose-Capillary: 187 mg/dL — ABNORMAL HIGH (ref 70–99)
Glucose-Capillary: 185 mg/dL — ABNORMAL HIGH (ref 70–99)
Glucose-Capillary: 182 mg/dL — ABNORMAL HIGH (ref 70–99)
Glucose-Capillary: 309 mg/dL — ABNORMAL HIGH (ref 70–99)
Glucose-Capillary: 226 mg/dL — ABNORMAL HIGH (ref 70–99)

## 2024-01-07 LAB — CBC WITH DIFFERENTIAL/PLATELET
Abs Immature Granulocytes: 0.01 K/uL (ref 0.00–0.07)
Basophils Absolute: 0 K/uL (ref 0.0–0.1)
Basophils Relative: 0 %
Eosinophils Absolute: 0.1 K/uL (ref 0.0–0.5)
Eosinophils Relative: 2 %
HCT: 42.5 % (ref 39.0–52.0)
Hemoglobin: 14.1 g/dL (ref 13.0–17.0)
Immature Granulocytes: 0 %
Lymphocytes Relative: 50 %
Lymphs Abs: 2.6 K/uL (ref 0.7–4.0)
MCH: 28.1 pg (ref 26.0–34.0)
MCHC: 33.2 g/dL (ref 30.0–36.0)
MCV: 84.8 fL (ref 80.0–100.0)
Monocytes Absolute: 0.4 K/uL (ref 0.1–1.0)
Monocytes Relative: 8 %
Neutro Abs: 2.1 K/uL (ref 1.7–7.7)
Neutrophils Relative %: 40 %
Platelets: 193 K/uL (ref 150–400)
RBC: 5.01 MIL/uL (ref 4.22–5.81)
RDW: 12.8 % (ref 11.5–15.5)
WBC: 5.3 K/uL (ref 4.0–10.5)
nRBC: 0 % (ref 0.0–0.2)

## 2024-01-07 LAB — BETA-HYDROXYBUTYRIC ACID
Beta-Hydroxybutyric Acid: 0.36 mmol/L — ABNORMAL HIGH (ref 0.05–0.27)
Beta-Hydroxybutyric Acid: 1.05 mmol/L — ABNORMAL HIGH (ref 0.05–0.27)
Beta-Hydroxybutyric Acid: 1.77 mmol/L — ABNORMAL HIGH (ref 0.05–0.27)
Beta-Hydroxybutyric Acid: 0.88 mmol/L — ABNORMAL HIGH (ref 0.05–0.27)

## 2024-01-07 MED ORDER — BLOOD GLUCOSE TEST VI STRP
1.0000 | ORAL_STRIP | 0 refills | Status: AC
  Filled 2024-01-07: qty 100, 25d supply, fill #0

## 2024-01-07 MED ORDER — PEN NEEDLES 31G X 5 MM MISC
1.0000 | 0 refills | Status: AC
  Filled 2024-01-07: qty 100, 25d supply, fill #0

## 2024-01-07 MED ORDER — INSULIN GLARGINE 100 UNIT/ML SOLOSTAR PEN
18.0000 [IU] | PEN_INJECTOR | Freq: Every day | SUBCUTANEOUS | 0 refills | Status: AC
  Filled 2024-01-07: qty 15, 83d supply, fill #0

## 2024-01-07 MED ORDER — INSULIN GLARGINE 100 UNIT/ML ~~LOC~~ SOLN
10.0000 [IU] | Freq: Once | SUBCUTANEOUS | Status: AC
  Administered 2024-01-07: 10 [IU] via SUBCUTANEOUS
  Filled 2024-01-07: qty 0.1

## 2024-01-07 MED ORDER — SITAGLIPTIN PHOSPHATE 50 MG PO TABS
50.0000 mg | ORAL_TABLET | Freq: Every day | ORAL | 2 refills | Status: DC
  Filled 2024-01-07: qty 30, 30d supply, fill #0

## 2024-01-07 MED ORDER — LANCET DEVICE MISC
1.0000 | 0 refills | Status: DC
  Filled 2024-01-07: qty 1, fill #0

## 2024-01-07 MED ORDER — INSULIN ASPART 100 UNIT/ML FLEXPEN
6.0000 [IU] | PEN_INJECTOR | Freq: Three times a day (TID) | SUBCUTANEOUS | 0 refills | Status: AC
  Filled 2024-01-07: qty 15, 30d supply, fill #0

## 2024-01-07 MED ORDER — BLOOD GLUCOSE MONITOR SYSTEM W/DEVICE KIT
1.0000 | PACK | 0 refills | Status: AC
  Filled 2024-01-07: qty 1, 30d supply, fill #0

## 2024-01-07 MED ORDER — INSULIN ASPART 100 UNIT/ML IJ SOLN: 0.0000 [IU] | Freq: Every day | INTRAMUSCULAR | Status: DC

## 2024-01-07 MED ORDER — INSULIN ASPART 100 UNIT/ML IJ SOLN
0.0000 [IU] | Freq: Three times a day (TID) | INTRAMUSCULAR | Status: DC
  Administered 2024-01-07: 7 [IU] via SUBCUTANEOUS
  Administered 2024-01-07: 3 [IU] via SUBCUTANEOUS
  Filled 2024-01-07: qty 7
  Filled 2024-01-07: qty 3

## 2024-01-07 MED ORDER — LIVING WELL WITH DIABETES BOOK
Freq: Once | Status: AC
  Filled 2024-01-07: qty 1

## 2024-01-07 MED ORDER — POTASSIUM CHLORIDE CRYS ER 20 MEQ PO TBCR
40.0000 meq | EXTENDED_RELEASE_TABLET | Freq: Once | ORAL | Status: AC
  Administered 2024-01-07: 40 meq via ORAL
  Filled 2024-01-07: qty 2

## 2024-01-07 MED ORDER — INSULIN STARTER KIT- PEN NEEDLES (ENGLISH)
1.0000 | Freq: Once | Status: AC
  Administered 2024-01-07: 1
  Filled 2024-01-07: qty 1

## 2024-01-07 MED ORDER — INSULIN GLARGINE-YFGN 100 UNIT/ML ~~LOC~~ SOLN: 10.0000 [IU] | Freq: Once | SUBCUTANEOUS | Status: DC

## 2024-01-07 MED ORDER — LANCETS MISC
1.0000 | 0 refills | Status: DC
  Filled 2024-01-07: qty 100, 25d supply, fill #0

## 2024-01-07 NOTE — Discharge Summary (Signed)
 Physician Discharge Summary  Randy Rose FMW:[id number] DOB: July 14, 1965 DOA: 01/06/2024  PCP: Randy Rose LABOR, FNP  Admit date: 01/06/2024 Discharge date: 01/07/2024  Admitted From: Home Disposition: Home  Recommendations for Outpatient Follow-up:  Follow up with PCP in 1-2 weeks Please obtain BMP/CBC in one week Will send referral to endocrine and podiatry for follow-up.  Home Health: N/A Equipment/Devices: N/A  Discharge Condition: Stable CODE STATUS: Full code Diet recommendation: Low-carb diet  Discharge summary: 58 year old with type 2 diabetes, recently on metformin  but intolerance and not on any treatment for few months, hypertension, history of stroke who presented to the emergency room with about 3 weeks of fatigue, weakness, polyphagia and polydipsia.  In the emergency room blood pressure is stable.  On room air.  Heart rate 121.  Blood glucose 545, CO2 18, anion gap 22 with normal pH.  CT scan abdomen pelvis with no acute findings.  Admitted as DKA and treated with insulin  infusion with quick resolution of symptoms.  Hemoglobin A1c 10.7.  Type 2 diabetes, uncontrolled with hyperglycemia, DKA: Treated with insulin  infusion.  Symptomatically improved.  Now tolerating subcu insulin  and regular diet. A1c 10.7. Discharging home with long-acting insulin  18 units, prandial insulin  6 units plus sliding scale, insulin  education, hypoglycemic symptoms monitoring and treatment. Referral sent to endocrine for follow-up. Patient also has calluses in his foot, will send to podiatry for follow-up.  Diabetic education discussed.  Footcare discussed.  Hypertension: Blood pressure is stable on lisinopril  hydrochlorothiazide  and Norvasc .  Continue.  He is also on Lipitor that he will continue.  Stable to discharge with close outpatient follow-up.   Discharge Diagnoses:  Principal Problem:   DKA, type 2 (HCC) Active Problems:   Essential hypertension   History of CVA  (cerebrovascular accident)   Chronic gout involving toe of right foot without tophus   Mild intermittent asthma   Mixed anxiety and depressive disorder    Discharge Instructions  Discharge Instructions     Ambulatory referral to Endocrinology   Complete by: As directed    Ambulatory referral to Podiatry   Complete by: As directed    Diet Carb Modified   Complete by: As directed    Increase activity slowly   Complete by: As directed       Allergies as of 01/07/2024       Reactions   Metformin  And Related Diarrhea, Itching, Nausea Only, Other (See Comments)   Headaches, too        Medication List     STOP taking these medications    amoxicillin -clavulanate 875-125 MG tablet Commonly known as: AUGMENTIN    ARIPiprazole  5 MG tablet Commonly known as: ABILIFY    colchicine  0.6 MG tablet   guaiFENesin -dextromethorphan 100-10 MG/5ML syrup Commonly known as: ROBITUSSIN DM   ibuprofen  600 MG tablet Commonly known as: ADVIL    lamoTRIgine  100 MG tablet Commonly known as: LAMICTAL    lidocaine  2 % solution Commonly known as: XYLOCAINE    lidocaine  5 % Commonly known as: Lidoderm    meloxicam  7.5 MG tablet Commonly known as: Mobic    metFORMIN  500 MG tablet Commonly known as: GLUCOPHAGE    naproxen  500 MG tablet Commonly known as: NAPROSYN    predniSONE  10 MG (21) Tbpk tablet Commonly known as: STERAPRED UNI-PAK 21 TAB   tiZANidine  4 MG tablet Commonly known as: Zanaflex    venlafaxine  37.5 MG tablet Commonly known as: EFFEXOR    vortioxetine  HBr 10 MG Tabs tablet Commonly known as: Trintellix        TAKE these medications  Aleve  220 MG tablet Generic drug: naproxen  sodium Take 220 mg by mouth 2 (two) times daily as needed (for pain).   amLODipine  10 MG tablet Commonly known as: NORVASC  Take 10 mg by mouth daily. What changed: Another medication with the same name was removed. Continue taking this medication, and follow the directions you see  here.   Blood Glucose Monitoring Suppl Devi 1 each by Does not apply route as directed. Dispense based on patient and insurance preference. Use up to four times daily as directed. (FOR ICD-10 E10.9, E11.9).   BLOOD GLUCOSE TEST STRIPS Strp 1 each by Does not apply route as directed. Dispense based on patient and insurance preference. Use up to four times daily as directed. (FOR ICD-10 E10.9, E11.9).   insulin  aspart 100 UNIT/ML FlexPen Commonly known as: NOVOLOG  Inject 6 Units into the skin 3 (three) times daily with meals. If eating and Blood Glucose (BG) 80 or higher inject 6 units for meal coverage and add correction dose per scale. If not eating, correction dose only. BG <150= 0 unit; BG 150-200= 1 unit; BG 201-250= 2 unit; BG 251-300= 3 unit; BG 301-350= 4 unit; BG 351-400= 5 unit; BG >400= 6 unit and Call Primary Care.   insulin  glargine 100 UNIT/ML Solostar Pen Commonly known as: LANTUS  Inject 18 Units into the skin daily. May substitute as needed per insurance. Start taking on: January 08, 2024   Lancet Device Misc 1 each by Does not apply route as directed. Dispense based on patient and insurance preference. Use up to four times daily as directed. (FOR ICD-10 E10.9, E11.9).   Lancets Misc 1 each by Does not apply route as directed. Dispense based on patient and insurance preference. Use up to four times daily as directed. (FOR ICD-10 E10.9, E11.9).   lisinopril -hydrochlorothiazide  20-12.5 MG tablet Commonly known as: ZESTORETIC  Take 1 tablet by mouth 2 (two) times daily.   Pen Needles 31G X 5 MM Misc 1 each by Does not apply route as directed. Dispense based on patient and insurance preference. Use up to four times daily as directed. (FOR ICD-10 E10.9, E11.9).   sitaGLIPtin  50 MG tablet Commonly known as: Januvia  Take 1 tablet (50 mg total) by mouth daily.   valACYclovir  500 MG tablet Commonly known as: VALTREX  Take 500 mg by mouth See admin instructions. Take 500 mg by  mouth twice a day for 3-4 days during flares        Allergies[1]  Consultations: Diabetic coordinator   Procedures/Studies: Portable chest x-ray (1 view) Result Date: 01/06/2024 CLINICAL DATA:  DKA EXAM: PORTABLE CHEST 1 VIEW COMPARISON:  10/18/2022 FINDINGS: The heart size and mediastinal contours are within normal limits. Both lungs are clear. The visualized skeletal structures are unremarkable. IMPRESSION: No active disease. Electronically Signed   By: Oneil Devonshire M.D.   On: 01/06/2024 20:56   CT ABDOMEN PELVIS W CONTRAST Result Date: 01/06/2024 EXAM: CT ABDOMEN AND PELVIS WITH CONTRAST 01/06/2024 01:36:56 PM TECHNIQUE: CT of the abdomen and pelvis was performed with the administration of 100 mL of iohexol  (OMNIPAQUE ) 300 MG/ML solution. Multiplanar reformatted images are provided for review. Automated exposure control, iterative reconstruction, and/or weight-based adjustment of the mA/kV was utilized to reduce the radiation dose to as low as reasonably achievable. COMPARISON: CT performed 05/31/2023. CLINICAL HISTORY: Abdominal pain, acute, nonlocalized. FINDINGS: LOWER CHEST: Likely bibasilar subsegmental atelectasis. LIVER: Hepatic steatosis. GALLBLADDER AND BILE DUCTS: Gallbladder is unremarkable. No biliary ductal dilatation. SPLEEN: No acute abnormality. PANCREAS: No acute abnormality. ADRENAL  GLANDS: No acute abnormality. KIDNEYS, URETERS AND BLADDER: Minimally hyperdense 2.1 cm left lower pole cystic renal lesion, favor to represent proteinaceous or hemorrhagic cyst. This is unchanged from the prior study and minimally increased in size since CT performed in 04/2016. Additional subcentimeter renal hypodensities, too small to characterize. No stones in the kidneys or ureters. No hydronephrosis. No perinephric or periureteral stranding. The bladder is decompressed, limiting evaluation. Unchanged anterior bladder wall contour, possibly related to tethering. GI AND BOWEL: Stomach  demonstrates no acute abnormality. There is no bowel obstruction. PERITONEUM AND RETROPERITONEUM: No ascites. No free air. VASCULATURE: Aorta is normal in caliber. LYMPH NODES: No lymphadenopathy. REPRODUCTIVE ORGANS: No acute abnormality. BONES AND SOFT TISSUES: Fat-containing umbilical and left inguinal hernias. No acute osseous abnormality. No focal soft tissue abnormality. IMPRESSION: 1. No acute findings in the abdomen or pelvis. 2. Stable chronic and incidental findings as noted above. Electronically signed by: Michaeline Blanch MD 01/06/2024 01:50 PM EST RP Workstation: [id number]   (Echo, Carotid, EGD, Colonoscopy, ERCP)    Subjective: Patient seen in the morning rounds.  Denies any complaints.  Blood sugars are improved.  He is eating regular food.  Patient is comfortable with plan to go home with insulin , he is comfortable taking insulin  injections himself.   Discharge Exam: Vitals:   01/07/24 0900 01/07/24 1031  BP: 138/84 (!) 153/67  Pulse: 83   Resp: 19   Temp:    SpO2: 94%    Vitals:   01/07/24 0723 01/07/24 0800 01/07/24 0900 01/07/24 1031  BP:  (!) 145/89 138/84 (!) 153/67  Pulse:  88 83   Resp:  (!) 24 19   Temp: 98.1 F (36.7 C)     TempSrc: Oral     SpO2:  99% 94%   Weight:      Height:        General: Pt is alert, awake, not in acute distress Cardiovascular: RRR, S1/S2 +, no rubs, no gallops Respiratory: CTA bilaterally, no wheezing, no rhonchi Abdominal: Soft, NT, ND, bowel sounds + Extremities: no edema, no cyanosis    The results of significant diagnostics from this hospitalization (including imaging, microbiology, ancillary and laboratory) are listed below for reference.     Microbiology: Recent Results (from the past 240 hours)  Group A Strep by PCR     Status: None   Collection Time: 01/06/24 12:49 PM   Specimen: Throat; Sterile Swab  Result Value Ref Range Status   Group A Strep by PCR NOT DETECTED NOT DETECTED Final    Comment: Performed at [facility], [address] [place], [place] [address]  MRSA Next Gen by PCR, Nasal     Status: None   Collection Time: 01/06/24  6:46 PM   Specimen: Nasal Mucosa; Nasal Swab  Result Value Ref Range Status   MRSA by PCR Next Gen NOT DETECTED NOT DETECTED Final    Comment: (NOTE) The GeneXpert MRSA Assay (FDA approved for NASAL specimens only), is one component of a comprehensive MRSA colonization surveillance program. It is not intended to diagnose MRSA infection nor to guide or monitor treatment for MRSA infections. Test performance is not FDA approved in patients less than 34 years old. Performed at [facility], [address] [place], [place] [address]      Labs: BNP (last 3 results) No results for input(s): BNP in the last 8760 hours. Basic Metabolic Panel: Recent Labs  Lab 01/06/24 1910 01/06/24 2111 01/07/24 0041 01/07/24 9474  01/07/24 0832  NA 142 143 140 140 140  K 4.2 4.2 3.2* 3.5 4.7  CL 106 107 105 104 105  CO2 24 28 24 26 26   GLUCOSE 182* 159* 184* 247* 293*  BUN 11 11 11 11 12   CREATININE 0.95 0.86 0.83 0.87 0.91  CALCIUM 9.5 9.5 9.2 8.9 9.2   Liver Function Tests: No results for input(s): AST, ALT, ALKPHOS, BILITOT, PROT, ALBUMIN in the last 168 hours. No results for input(s): LIPASE, AMYLASE in the last 168 hours. No results for input(s): AMMONIA in the last 168 hours. CBC: Recent Labs  Lab 01/06/24 1158 01/06/24 1214 01/07/24 0525  WBC 5.4  --  5.3  NEUTROABS  --   --  2.1  HGB 16.8 17.7* 14.1  HCT 49.3 52.0 42.5  MCV 82.9  --  84.8  PLT 222  --  193   Cardiac Enzymes: No results for input(s): CKTOTAL, CKMB, CKMBINDEX, TROPONINI in the last 168 hours. BNP: Invalid input(s): POCBNP CBG: Recent Labs  Lab 01/07/24 0101 01/07/24 0200 01/07/24 0301 01/07/24 0402 01/07/24 0720  GLUCAP 188* 187* 185* 182* 309*   D-Dimer No results for input(s): DDIMER in the last 72 hours. Hgb  A1c Recent Labs    01/06/24 2111  HGBA1C 10.7*   Lipid Profile No results for input(s): CHOL, HDL, LDLCALC, TRIG, CHOLHDL, LDLDIRECT in the last 72 hours. Thyroid function studies No results for input(s): TSH, T4TOTAL, T3FREE, THYROIDAB in the last 72 hours.  Invalid input(s): FREET3 Anemia work up No results for input(s): VITAMINB12, FOLATE, FERRITIN, TIBC, IRON, RETICCTPCT in the last 72 hours. Urinalysis    Component Value Date/Time   COLORURINE YELLOW 01/06/2024 1148   APPEARANCEUR CLEAR 01/06/2024 1148   LABSPEC 1.010 01/06/2024 1148   PHURINE 5.0 01/06/2024 1148   GLUCOSEU >=500 (A) 01/06/2024 1148   HGBUR TRACE (A) 01/06/2024 1148   BILIRUBINUR NEGATIVE 01/06/2024 1148   KETONESUR 40 (A) 01/06/2024 1148   PROTEINUR NEGATIVE 01/06/2024 1148   NITRITE NEGATIVE 01/06/2024 1148   LEUKOCYTESUR NEGATIVE 01/06/2024 1148   Sepsis Labs Recent Labs  Lab 01/06/24 1158 01/07/24 0525  WBC 5.4 5.3   Microbiology Recent Results (from the past 240 hours)  Group A Strep by PCR     Status: None   Collection Time: 01/06/24 12:49 PM   Specimen: Throat; Sterile Swab  Result Value Ref Range Status   Group A Strep by PCR NOT DETECTED NOT DETECTED Final    Comment: Performed at [facility], [address] [place], [place] [address]  MRSA Next Gen by PCR, Nasal     Status: None   Collection Time: 01/06/24  6:46 PM   Specimen: Nasal Mucosa; Nasal Swab  Result Value Ref Range Status   MRSA by PCR Next Gen NOT DETECTED NOT DETECTED Final    Comment: (NOTE) The GeneXpert MRSA Assay (FDA approved for NASAL specimens only), is one component of a comprehensive MRSA colonization surveillance program. It is not intended to diagnose MRSA infection nor to guide or monitor treatment for MRSA infections. Test performance is not FDA approved in patients less than 5 years old. Performed at [facility], [address], [place], [place] [address]      Time coordinating discharge:  32 minutes  SIGNED:   Renato Applebaum, MD  [facility] 01/07/2024, 11:48 AM     [1]  Allergies Allergen Reactions   Metformin  And Related Diarrhea, Itching, Nausea Only and Other (See Comments)  Headaches, too

## 2024-01-07 NOTE — Inpatient Diabetes Management (Signed)
 Inpatient Diabetes Program Recommendations  AACE/ADA: New Consensus Statement on Inpatient Glycemic Control (2015)  Target Ranges:  Prepandial:   less than 140 mg/dL      Peak postprandial:   less than 180 mg/dL (1-2 hours)      Critically ill patients:  140 - 180 mg/dL   Lab Results  Component Value Date   GLUCAP 309 (H) 01/07/2024   HGBA1C 10.7 (H) 01/06/2024    Review of Glycemic Control  Diabetes history: DM2 Outpatient Diabetes medications: None Current orders for Inpatient glycemic control: Lantus  10 units daily, Novolog  0-9 TID with meals and 0-5 HS  HgbA1C - 10.7%  Inpatient Diabetes Program Recommendations:    For home:  Lantus  18 units daily  Novolog  6 units TID with meals + s/s (0-9 TID)  Spoke with pt at bedside regarding his diabetes control and HgbA1C of 10/7% (average blood sugar of 262 mg/dL) Pt has glucose meter at home and instructed to monitor blood sugars at least 3-4x/day and record in logbook. Discussed glucose and HgbA1C goals. Discussed maintaining good  glycemic control to prevent long and short-term complications. Explained how hyperglycemia leads to damage within blood vessels which lead to the common complications seen with uncontrolled diabetes. Stressed to the patient the importance of improving glycemic control to prevent further complications from uncontrolled diabetes. Discussed impact of nutrition, exercise, stress, sickness, and medications on diabetes control.  Discussed carbohydrates, along with portion control. Pt needs PCP to manage his diabetes.  Educated patient and spouse on insulin  pen use at home. Ordered insulin  flexpen starter kit. Reviewed all steps of insulin  pen including attachment of needle, 2-unit air shot, dialing up dose, giving injection, removing needle, disposal of sharps, storage of unused insulin , disposal of insulin  etc. Patient able to provide successful return demonstration. Also reviewed troubleshooting with insulin  pen. MD  to give patient Rxs for insulin  pens and insulin  pen needles. Discussed hypoglycemia s/s and treatment.  Answered all questions. Pt appears to be motivated to make lifestyle changes and control his blood sugars.   Discussed with RN. Pt will give lunchtime insulin  dose of Novolog .  Thank you. Shona Brandy, RD, LDN, CDCES Inpatient Diabetes Coordinator [phone]

## 2024-01-07 NOTE — Progress Notes (Signed)
 Potassium 3.2, insulin  drip paused. Ok to resume insulin  drip to transition patient after oral potassium has been administered per A. Andrez NP.

## 2024-01-07 NOTE — Progress Notes (Signed)
" °   01/07/24 1403  TOC Brief Assessment  Insurance and Status Reviewed  Patient has primary care physician Yes  Home environment has been reviewed Single family home  Prior level of function: Independent with ADL's  Prior/Current Home Services No current home services  Social Drivers of Health Review SDOH reviewed no interventions necessary  Readmission risk has been reviewed Yes  Transition of care needs no transition of care needs at this time   Pt screened. Pt needing help with transportation assistance. States his vehicle was left a [employer] and will need help getting back to his car. RNCM spoke with supervisor Delon Lesches and she approved taxi voucher. Taxi voucher given to Floor RN to call for taxi once pt is ready to discharge. No further ICM needs at this time. Will sign off. "

## 2024-01-07 NOTE — Plan of Care (Signed)
  Problem: Education: Goal: Knowledge of General Education information will improve Description: Including pain rating scale, medication(s)/side effects and non-pharmacologic comfort measures Outcome: Progressing   Problem: Clinical Measurements: Goal: Diagnostic test results will improve Outcome: Progressing   Problem: Activity: Goal: Risk for activity intolerance will decrease Outcome: Progressing   Problem: Nutrition: Goal: Adequate nutrition will be maintained Outcome: Progressing   Problem: Safety: Goal: Ability to remain free from injury will improve Outcome: Progressing   Problem: Metabolic: Goal: Ability to maintain appropriate glucose levels will improve Outcome: Progressing

## 2024-01-07 NOTE — Progress Notes (Signed)
 Discharge meds in a secure bag delivered to patient by this RN

## 2024-01-18 ENCOUNTER — Other Ambulatory Visit (HOSPITAL_COMMUNITY): Payer: Self-pay

## 2024-01-22 ENCOUNTER — Ambulatory Visit (INDEPENDENT_AMBULATORY_CARE_PROVIDER_SITE_OTHER): Payer: MEDICAID | Admitting: Podiatry

## 2024-01-22 DIAGNOSIS — M79671 Pain in right foot: Secondary | ICD-10-CM

## 2024-01-22 DIAGNOSIS — B351 Tinea unguium: Secondary | ICD-10-CM

## 2024-01-22 DIAGNOSIS — M79672 Pain in left foot: Secondary | ICD-10-CM

## 2024-01-22 MED ORDER — KETOCONAZOLE 2 % EX CREA: TOPICAL_CREAM | CUTANEOUS | 9 refills | Status: AC

## 2024-01-22 MED ORDER — TERBINAFINE HCL 250 MG PO TABS: 250.0000 mg | ORAL_TABLET | Freq: Every day | ORAL | 1 refills | Status: AC

## 2024-01-22 NOTE — Progress Notes (Signed)
" ° °  Subjective:    HPI Presents with complaint of thick painful nails that causes discomfort with  walking and wearing shoes.  Have been this way for many years and have been getting worse.  Gets dry skin around the plantar aspect of the feet around the edge of the feet.   Objective:  Physical Exam   General: AAO x3, NAD  Vascular: DP and PT pulses palpable bilaterally.  Immedate capillary fill time digits. No significant lower extremity edema bilaterally.  Dermatological:  Onychomycotic mycotic changes nails 1 through 5 with discoloration nail and subungual debris and thickening of the nail with redness along the nail folds.  Tenderness with pressure on nail plates..  Neruologic:  Light touch intact bilaterally decreased vibratory sensation feet bilaterally left greater than right.  Monofilament sensation intact 1 through 5 bilaterally.  Normal Achilles tendon reflex bilaterally  Musculoskeletal:  Normal lower extremity muscle strength.  Assessment:  Painful onychomycotic nails 1 through 5 bilaterally. Pain feet b/l     Plan:  - New patient office visit level 3 for evaluation and management -Discussed with with patient onychomycosis and etiology and treatment.  Discussed topical versus oral agents.  Discussed risk and benefits of both.  No history of hepatic or renal disease.  Patient would like to start oral Lamisil  treatment.  Explained that we would check labs every 6 weeks during course of treatment to monitor for any side effects. -Rx: Lamisil  250 mg p.o. daily, refill x 1 -Rx: Ketoconazole  cream 2%, apply twice daily to affected areas feet 1 g per foot, refill x 9 -Labs ordered today for liver function tests to monitor for any hepatic side effects from the Lamisil .  Return 6 weeks Lamisil  3  "

## 2024-02-06 ENCOUNTER — Other Ambulatory Visit (HOSPITAL_BASED_OUTPATIENT_CLINIC_OR_DEPARTMENT_OTHER): Payer: Self-pay

## 2024-02-06 ENCOUNTER — Other Ambulatory Visit: Payer: Self-pay

## 2024-02-06 ENCOUNTER — Encounter (HOSPITAL_BASED_OUTPATIENT_CLINIC_OR_DEPARTMENT_OTHER): Payer: Self-pay

## 2024-02-06 ENCOUNTER — Emergency Department (HOSPITAL_BASED_OUTPATIENT_CLINIC_OR_DEPARTMENT_OTHER)
Admission: EM | Admit: 2024-02-06 | Discharge: 2024-02-06 | Disposition: A | Discharge status: Home / Self Care | Payer: MEDICAID | Attending: Emergency Medicine | Admitting: Emergency Medicine

## 2024-02-06 DIAGNOSIS — E119 Type 2 diabetes mellitus without complications: Secondary | ICD-10-CM | POA: Insufficient documentation

## 2024-02-06 DIAGNOSIS — I10 Essential (primary) hypertension: Secondary | ICD-10-CM | POA: Diagnosis not present

## 2024-02-06 DIAGNOSIS — J029 Acute pharyngitis, unspecified: Secondary | ICD-10-CM | POA: Insufficient documentation

## 2024-02-06 LAB — GROUP A STREP BY PCR: Group A Strep by PCR: NOT DETECTED

## 2024-02-06 MED ORDER — AMOXICILLIN 500 MG PO CAPS
500.0000 mg | ORAL_CAPSULE | Freq: Two times a day (BID) | ORAL | 0 refills | Status: AC
  Filled 2024-02-06: qty 20, 10d supply, fill #0

## 2024-02-06 NOTE — ED Triage Notes (Signed)
 Pt arrives with complaints of sore throat. State that his grand baby has strep and he thinks he could have it as well.

## 2024-02-06 NOTE — ED Provider Notes (Signed)
 "  Emergency Department Provider Note   I have reviewed the triage vital signs and the nursing notes.   HISTORY  Chief Complaint Sore Throat   HPI Randy Rose is a 59 y.o. male past history of insulin -dependent diabetes presents emergency department with sore throat.  He has a grandchild who was diagnosed with strep this past weekend.  He has been significant time with his child and developed sore throat over the past 24 hours.  He is having pain with swallowing but is able to do so.  No shortness of breath.  No nasal congestion or cough/shortness of breath symptoms.  Past Medical History:  Diagnosis Date   Depression    Herpes simplex type 2 infection    Hyperlipidemia    Hypertension     Review of Systems  Constitutional: No fever/chills ENT: Positive sore throat. Cardiovascular: Denies chest pain. Respiratory: Denies shortness of breath. Gastrointestinal: No abdominal pain.  No nausea, no vomiting.   Neurological: Negative for headaches.  ____________________________________________   PHYSICAL EXAM:  VITAL SIGNS: ED Triage Vitals  Encounter Vitals Group     BP 02/06/24 0808 (!) 157/90     Pulse Rate 02/06/24 0808 83     Resp 02/06/24 0808 18     Temp 02/06/24 0808 98.4 F (36.9 C)     Temp Source 02/06/24 0808 Oral     SpO2 02/06/24 0808 95 %     Weight 02/06/24 0808 245 lb (111.1 kg)     Height 02/06/24 0808 6' 1 (1.854 m)   Constitutional: Alert and oriented. Well appearing and in no acute distress. Eyes: Conjunctivae are normal.  Head: Atraumatic. Nose: No congestion/rhinnorhea. Mouth/Throat: Mucous membranes are moist.  Posterior oropharynx is widely patent with erythema bilaterally.  No exudate.  No peritonsillar abscess visualized.  Clear voice.  No trismus.  Managing oral secretions. Neck: No stridor. Cardiovascular: Good peripheral circulation. Respiratory: Normal respiratory effort.  Gastrointestinal: No distention.  Musculoskeletal: No  gross deformities of extremities. Neurologic:  Normal speech and language.  Skin:  Skin is warm, dry and intact. No rash noted.  ____________________________________________   LABS (all labs ordered are listed, but only abnormal results are displayed)  Labs Reviewed  GROUP A STREP BY PCR   ____________________________________________   PROCEDURES  Procedure(s) performed:   Procedures  None  ____________________________________________   INITIAL IMPRESSION / ASSESSMENT AND PLAN / ED COURSE  Pertinent labs & imaging results that were available during my care of the patient were reviewed by me and considered in my medical decision making (see chart for details).   This patient is Presenting for Evaluation of sore throat, which does require a range of treatment options, and is a complaint that involves a moderate risk of morbidity and mortality.  The Differential Diagnoses include strep pharyngitis, viral pharyngitis, peritonsillar abscess, retropharyngeal abscess, etc.  Clinical Laboratory Tests Ordered, included   Medical Decision Making: Summary:  The patient presents emergency department with sore throat.  Arrives afebrile.  No SIRS vitals.  No exam findings to suspect deeper space neck infection or prompt labs/imaging other than strep PCR.  Clinically patient has had a close contact recently diagnosed with strep and feel this is most likely.  Reevaluation with update and discussion with Strep negative but given close in-home contact plan to cover with amoxicillin . No steroids given with IDDM history and recent admit for DKA.   Patient's presentation is most consistent with acute presentation with potential threat to life or bodily function.  Disposition: discharge  ____________________________________________  FINAL CLINICAL IMPRESSION(S) / ED DIAGNOSES  Final diagnoses:  Pharyngitis, unspecified etiology     NEW OUTPATIENT MEDICATIONS STARTED DURING THIS  VISIT:  Discharge Medication List as of 02/06/2024  9:00 AM     START taking these medications   Details  amoxicillin  (AMOXIL ) 500 MG capsule Take 1 capsule (500 mg total) by mouth 2 (two) times daily for 10 days., Starting Wed 02/06/2024, Until Sat 02/16/2024, Normal        Note:  This document was prepared using Dragon voice recognition software and may include unintentional dictation errors.  Fonda Law, MD, [facility] Emergency Medicine    Elisabeth Strom, Fonda MATSU, MD 02/06/24 [phone]  "

## 2024-02-06 NOTE — ED Notes (Signed)

## 2024-02-06 NOTE — ED Notes (Signed)
 ED Provider at bedside.

## 2024-02-06 NOTE — Discharge Instructions (Signed)
 I am treating you with antibiotics for your sore throat.  You may take Tylenol  and/or ibuprofen  unless you have been told not to take either of these medications by prior physicians.  Your symptoms may persist over the next few days but should gradually improve.  If you develop sudden worsening sore throat, difficulty breathing, inability to swallow you should return to the emergency department immediately.

## 2024-02-08 ENCOUNTER — Ambulatory Visit: Payer: MEDICAID | Admitting: Family

## 2024-02-08 ENCOUNTER — Encounter: Payer: Self-pay | Admitting: Family

## 2024-02-08 ENCOUNTER — Other Ambulatory Visit: Payer: Self-pay | Admitting: Family

## 2024-02-08 VITALS — BP 142/90 | HR 102 | Temp 98.5°F | Ht 73.0 in | Wt 252.4 lb

## 2024-02-08 DIAGNOSIS — Z6833 Body mass index (BMI) 33.0-33.9, adult: Secondary | ICD-10-CM

## 2024-02-08 DIAGNOSIS — J069 Acute upper respiratory infection, unspecified: Secondary | ICD-10-CM | POA: Diagnosis not present

## 2024-02-08 DIAGNOSIS — E6609 Other obesity due to excess calories: Secondary | ICD-10-CM | POA: Insufficient documentation

## 2024-02-08 DIAGNOSIS — J029 Acute pharyngitis, unspecified: Secondary | ICD-10-CM | POA: Diagnosis not present

## 2024-02-08 DIAGNOSIS — E669 Obesity, unspecified: Secondary | ICD-10-CM

## 2024-02-08 DIAGNOSIS — E119 Type 2 diabetes mellitus without complications: Secondary | ICD-10-CM

## 2024-02-08 DIAGNOSIS — I10 Essential (primary) hypertension: Secondary | ICD-10-CM

## 2024-02-08 DIAGNOSIS — Z7984 Long term (current) use of oral hypoglycemic drugs: Secondary | ICD-10-CM | POA: Diagnosis not present

## 2024-02-08 DIAGNOSIS — R457 State of emotional shock and stress, unspecified: Secondary | ICD-10-CM | POA: Insufficient documentation

## 2024-02-08 DIAGNOSIS — M179 Osteoarthritis of knee, unspecified: Secondary | ICD-10-CM | POA: Insufficient documentation

## 2024-02-08 LAB — POCT INFLUENZA A/B
Influenza A, POC: NEGATIVE
Influenza B, POC: NEGATIVE

## 2024-02-08 LAB — POC COVID19 BINAXNOW: SARS Coronavirus 2 Ag: NEGATIVE

## 2024-02-08 MED ORDER — DAPAGLIFLOZIN PROPANEDIOL 5 MG PO TABS: 5.0000 mg | ORAL_TABLET | Freq: Every day | ORAL | 5 refills | Status: AC

## 2024-02-08 MED ORDER — DEXCOM G7 RECEIVER DEVI: 1 refills | Status: DC

## 2024-02-08 MED ORDER — LISINOPRIL-HYDROCHLOROTHIAZIDE 20-12.5 MG PO TABS: 2.0000 | ORAL_TABLET | Freq: Every morning | ORAL | 5 refills | Status: AC

## 2024-02-08 MED ORDER — DEXCOM G7 SENSOR MISC: 11 refills | Status: DC

## 2024-02-08 MED ORDER — AZITHROMYCIN 250 MG PO TABS: ORAL_TABLET | ORAL | 0 refills | Status: AC

## 2024-02-08 MED ORDER — GUAIFENESIN ER 600 MG PO TB12: 600.0000 mg | ORAL_TABLET | Freq: Two times a day (BID) | ORAL | 0 refills | Status: AC

## 2024-02-08 NOTE — Patient Instructions (Addendum)
 Welcome to [facility], It was a pleasure meeting you today!  Stop the Amoxicillin  as it has not helped your symptoms & you do not have Strep throat. I have sent in Mucinex  to start for your cough. Drink plenty of fluids, at least 2.5 liters daily of caffeine free beverages. Remember to avoid Advil , Aleve , aspirins and generics as they can cause your blood pressure to go high - ok to take Tylenol . For your diabetes I have sent in Farxiga to replace your Januvia . It will work better to lower your blood sugar and protect your kidneys. I have also sent in the continuous glucose monitor to replace your glucometer and we will call you if this is approved by your insurance. Check a fasting glucose, goal is less than 120.  Check prior to meals and do not give yourself the 6 units of Novolog  if your glucose is less than 70. Remember to always eat at least a small meal or snack after giving yourself the Novolog  insulin  (fast acting). Continue the 18 units of Lantus  every day.  Schedule a 6 week follow up visit and bring your glucometer or CGM device (if approved) with you.    PLEASE NOTE: If you had any LAB tests please let us  know if you have not heard back within a few days. You may see your results on MyChart before we have a chance to review them but we will give you a call once they are reviewed by us . If we ordered any REFERRALS today, please let us  know if you have not heard from their office within the next week.  Let us  know through MyChart if you are needing REFILLS, or have your pharmacy send us  the request. You can also use MyChart to communicate with me or any office staff.  Please try these tips to maintain a healthy lifestyle: It is important that you exercise regularly at least 30 minutes 5 times a week. Think about what you will eat, plan ahead. Choose whole foods, & think  clean, green, fresh or frozen over canned, processed or packaged foods which  are more sugary, salty, and fatty. 70 to 75% of food eaten should be fresh vegetables and protein. 2-3  meals daily with healthy snacks between meals, but must be whole fruit, protein or vegetables. Aim to eat over a 10 hour period when you are active, for example, 7am to 5pm, and then STOP after your last meal of the day, drinking only water.  Shorter eating windows, 6-8 hours, are showing benefits in heart disease and blood sugar regulation. Drink water every day! Shoot for 64 ounces daily = 8 cups, no other drink is as healthy! Fruit juice is best enjoyed in a healthy way, by EATING the fruit.

## 2024-02-08 NOTE — Progress Notes (Signed)
 "  New Patient Office Visit  Subjective:  Patient ID: Randy Rose, male    DOB: 08-22-1965  Age: 59 y.o. MRN: [id number]  CC:  Chief Complaint  Patient presents with   New Patient (Initial Visit)   Hypertension   Sore Throat    Pt c/o sore throat, present for 3 days. Strep was negative on 02/06/2024, prescribed amoxicillin .    Diabetes   HPI Randy Rose presents for establishing care today. Discussed the use of AI scribe software for clinical note transcription with the patient, who gave verbal consent to proceed.  History of Present Illness Randy Rose is a 59 year old male with diabetes and hypertension who presents with a sore throat and worsening symptoms.  He has had a sore throat since February 06, 2024. He was evaluated in the emergency department with a negative strep swab but was started on amoxicillin . Since then his symptoms have worsened with severe sore throat, nausea, body aches, stiff neck, cough, and poor sleep from choking and gagging sensations. He has productive cough with sputum that was yellow and is now creamy white. He denies ear pain but notes a rattling sensation when lying down and reports prior fever and feeling ill. He has diabetes. He was hospitalized about three weeks ago for ketoacidosis after a blood sugar spike. Metformin  was stopped due to an adverse reaction, and he was started on insulin . He now uses Novolog  18 units in the morning and 6 units three times daily before meals, plus Januvia  in the morning. He checks his blood sugars with a glucometer. His A1c in December 2025 was 10.7. He has hypertension treated with lisinopril  hydrochlorothiazide . Pain and illness raise his blood pressure, and he has dizziness and headaches at times, with episodes of walking into walls. He was previously on amlodipine , which may have been stopped for leg swelling, but he is unsure. His blood pressure is often high when he is in pain or unwell. He received a flu shot in  September 2025. He does not smoke and denies asthma or COPD.  Assessment & Plan Acute upper respiratory infection Symptoms worsened despite negative strep test, suggesting possible progression to full infection. Differential includes flu and COVID-19. - Ordered flu and COVID-19 tests, both negative. - Continue amoxicillin  as prescribed. - Advised against NSAIDs due to potential blood pressure elevation. - Recommended Tylenol  for pain management. - Call back if sx are not resolving.  Type 2 diabetes mellitus Recent hospitalization for diabetic ketoacidosis. A1c was 10.7% in December. Discussed switching from Januvia  to Farxiga  for better glycemic control and renal protection. Prefer continuous glucose monitoring for better management. - Ordered continuous glucose monitoring device (Dexcom or Libre depending on formulary). - Switched from Januvia  to Farxiga  5mg  qd, pending insurance approval. - Continue current insulin  regimen with Novolog . - Monitor blood glucose levels before meals and fasting. - Provided parameters for insulin  dosing to avoid hypoglycemia. - F/U in 6 weeks  Essential hypertension Blood pressure may be elevated due to pain and recent illness. Previous use of amlodipine  discontinued due to side effects like ankle swelling. Discussed increasing lisinopril  dosage for better control. - Increased lisinopril -hctz dosage to 2 pills of 20-12.5mg  qam, refill sent. - Review chart for previous amlodipine  side effects. - Advised against NSAIDs due to potential blood pressure elevation. - F/U in 6 weeks   Subjective:    Outpatient Medications Prior to Visit  Medication Sig Dispense Refill   amoxicillin  (AMOXIL ) 500 MG capsule Take 1 capsule (500 mg  total) by mouth 2 (two) times daily for 10 days. 20 capsule 0   Blood Glucose Monitoring Suppl (BLOOD GLUCOSE MONITOR SYSTEM) w/Device KIT Use to check blood sugar up to four times daily 1 kit 0   Glucose Blood (BLOOD GLUCOSE TEST  STRIPS) STRP Use up to four times daily as directed. (FOR ICD-10 E10.9, E11.9). 100 each 0   insulin  aspart (NOVOLOG ) 100 UNIT/ML FlexPen Inject 6 Units into the skin 3 (three) times daily with meals. If eating and Blood Glucose (BG) 80 or higher inject 6 units for meal coverage and add correction dose per scale. If not eating, correction dose only. BG <150= 0 unit; BG 150-200= 1 unit; BG 201-250= 2 unit; BG 251-300= 3 unit; BG 301-350= 4 unit; BG 351-400= 5 unit; BG >400= 6 unit and Call Primary Care. 15 mL 0   insulin  glargine (LANTUS ) 100 UNIT/ML Solostar Pen Inject 18 Units into the skin daily. May substitute as needed per insurance. 15 mL 0   Insulin  Pen Needle (PEN NEEDLES) 31G X 5 MM MISC Use up to four times daily as directed. (FOR ICD-10 E10.9, E11.9). 100 each 0   ketoconazole  (NIZORAL ) 2 % cream Ketoconazole  cream 2%, apply 1 g per foot twice daily 120 g 9   Lancet Device MISC 1 each by Does not apply route as directed. Dispense based on patient and insurance preference. Use up to four times daily as directed. (FOR ICD-10 E10.9, E11.9). 1 each 0   Lancets MISC Use up to four times daily as directed. (FOR ICD-10 E10.9, E11.9). 100 each 0   terbinafine  (LAMISIL ) 250 MG tablet Take 1 tablet (250 mg total) by mouth daily. 30 tablet 1   valACYclovir  (VALTREX ) 500 MG tablet Take 500 mg by mouth See admin instructions. Take 500 mg by mouth twice a day for 3-4 days during flares     amLODipine  (NORVASC ) 10 MG tablet Take 10 mg by mouth daily.     lisinopril -hydrochlorothiazide  (ZESTORETIC ) 20-12.5 MG tablet Take 1 tablet by mouth 2 (two) times daily.     naproxen  sodium (ALEVE ) 220 MG tablet Take 220 mg by mouth 2 (two) times daily as needed (for pain).     sitaGLIPtin  (JANUVIA ) 50 MG tablet Take 1 tablet (50 mg total) by mouth daily. 30 tablet 2   No facility-administered medications prior to visit.   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  Objective:   Today's Vitals: BP (!) 142/90 (BP Location: Left Arm, Patient Position: Sitting, Cuff Size: Large)   Pulse (!) 102   Temp 98.5 F (36.9 C) (Temporal)   Ht 6' 1 (1.854 m)   Wt 252 lb 6 oz (114.5 kg)   SpO2 98%   BMI 33.30  kg/m   Physical Exam Vitals and nursing note reviewed.  Constitutional:      General: He is not in acute distress.    Appearance: Normal appearance. He is obese.  HENT:     Head: Normocephalic.  Cardiovascular:     Rate and Rhythm: Normal rate and regular rhythm.  Pulmonary:     Effort: Pulmonary effort is normal.     Breath sounds: Normal breath sounds.  Musculoskeletal:        General: Normal range of motion.     Cervical back: Normal range of motion.  Skin:    General: Skin is warm and dry.  Neurological:     Mental Status: He is alert and oriented to person, place, and time.  Psychiatric:        Mood and Affect: Mood normal.    Meds ordered this encounter  Medications   DISCONTD: Continuous Glucose Sensor (DEXCOM G7 SENSOR) MISC    Sig: Apply sensor every 10 days. Pharmacy please provide 3 boxes (1 sensor per week)    Dispense:  3 each    Refill:  11    Supervising Provider:   ANDY, Randy Rose [[id number]]   DISCONTD: Continuous Glucose Receiver (DEXCOM G7 RECEIVER) DEVI     Sig: Please provide 1 dexcom g7 receiver    Dispense:  1 each    Refill:  1    Supervising Provider:   ANDY, Randy Rose [[id number]]   dapagliflozin  propanediol (FARXIGA ) 5 MG TABS tablet    Sig: Take 1 tablet (5 mg total) by mouth daily.    Dispense:  30 tablet    Refill:  5    D/C Januvia     Supervising Provider:   ANDY, Randy Rose [[id number]]   lisinopril -hydrochlorothiazide  (ZESTORETIC ) 20-12.5 MG tablet    Sig: Take 2 tablets by mouth in the morning.    Dispense:  60 tablet    Refill:  5    Supervising Provider:   ANDY, Randy Rose [[id number]]   guaiFENesin  (MUCINEX ) 600 MG 12 hr tablet    Sig: Take 1 tablet (600 mg total) by mouth 2 (two) times daily.    Dispense:  30 tablet    Refill:  0    Supervising Provider:   ANDY, Randy Rose [[id number]]   azithromycin  (ZITHROMAX ) 250 MG tablet    Sig: Take 2 tablets on day 1, then 1 tablet daily on days 2 through 5    Dispense:  6 tablet    Refill:  0    Supervising Provider:   ANDY, Randy Rose [[id number]]    Lucius Krabbe, NP "

## 2024-02-12 ENCOUNTER — Other Ambulatory Visit: Payer: Self-pay | Admitting: Family

## 2024-02-12 ENCOUNTER — Telehealth: Payer: Self-pay

## 2024-02-12 ENCOUNTER — Other Ambulatory Visit (HOSPITAL_COMMUNITY): Payer: Self-pay

## 2024-02-12 DIAGNOSIS — E669 Obesity, unspecified: Secondary | ICD-10-CM

## 2024-02-12 MED ORDER — DEXCOM G7 RECEIVER DEVI: 1.0000 | Freq: Two times a day (BID) | 1 refills | Status: DC

## 2024-02-12 NOTE — Telephone Encounter (Signed)
 Pharmacy Patient Advocate Encounter   Received notification from [facility] KEY that prior authorization for Dexcom G7 Sensor is required/requested.   Insurance verification completed.   The patient is insured through [facility] MEDICAID.   Per test claim: PA required; PA submitted to above mentioned insurance via Latent Key/confirmation #/EOC [id number] Status is pending

## 2024-02-12 NOTE — Telephone Encounter (Signed)
 Pharmacy Patient Advocate Encounter   Received notification from [facility] KEY that prior authorization for Farxiga  5MG  tablets is required/requested.   Insurance verification completed.   The patient is insured through [facility] MEDICAID.   Per test claim: PA required; PA submitted to above mentioned insurance via Latent Key/confirmation #/EOC [id number] Status is pending

## 2024-02-13 ENCOUNTER — Other Ambulatory Visit (HOSPITAL_COMMUNITY): Payer: Self-pay

## 2024-02-13 ENCOUNTER — Other Ambulatory Visit: Payer: Self-pay

## 2024-02-13 DIAGNOSIS — E669 Obesity, unspecified: Secondary | ICD-10-CM

## 2024-02-13 MED ORDER — DEXCOM G7 RECEIVER DEVI: 1.0000 | Freq: Two times a day (BID) | 1 refills | Status: AC

## 2024-02-13 MED ORDER — DEXCOM G7 SENSOR MISC: 11 refills | Status: AC

## 2024-02-13 NOTE — Telephone Encounter (Signed)
 Separate MyChart message sent to patient in regards to approval.

## 2024-02-13 NOTE — Telephone Encounter (Signed)
 Pharmacy Patient Advocate Encounter  Received notification from [facility] MEDICAID that Prior Authorization for Dexcom G7 Sensor  has been APPROVED from 02/12/24 to 08/11/24. Ran test claim, Copay is $4.00. This test claim was processed through [facility]- copay amounts may vary at other pharmacies due to pharmacy/plan contracts, or as the patient moves through the different stages of their insurance plan.   PA #/Case ID/Reference #: [id number]

## 2024-02-13 NOTE — Telephone Encounter (Signed)
 Pharmacy Patient Advocate Encounter  Received notification from [facility] MEDICAID that Prior Authorization for Farxiga  5MG  tablets  has been APPROVED from 02/12/24 to 02/11/25. Ran test claim, Copay is $4.00. This test claim was processed through [facility]- copay amounts may vary at other pharmacies due to pharmacy/plan contracts, or as the patient moves through the different stages of their insurance plan.   PA #/Case ID/Reference #: [id number]

## 2024-03-04 ENCOUNTER — Ambulatory Visit: Payer: MEDICAID | Admitting: Podiatry

## 2024-03-21 ENCOUNTER — Ambulatory Visit: Payer: MEDICAID | Admitting: Family

## 2024-06-17 ENCOUNTER — Ambulatory Visit: Payer: MEDICAID | Admitting: Endocrinology
